# Patient Record
Sex: Female | Born: 1965 | Race: White | Hispanic: No | Marital: Married | Smoking: Never smoker
Health system: Southern US, Community
[De-identification: ages and names within clinical notes are randomized; demographics above are authoritative.]

## PROBLEM LIST (undated history)

## (undated) DIAGNOSIS — Z9889 Other specified postprocedural states: Secondary | ICD-10-CM

## (undated) DIAGNOSIS — R000 Tachycardia, unspecified: Secondary | ICD-10-CM

## (undated) DIAGNOSIS — L409 Psoriasis, unspecified: Secondary | ICD-10-CM

## (undated) DIAGNOSIS — K219 Gastro-esophageal reflux disease without esophagitis: Secondary | ICD-10-CM

## (undated) DIAGNOSIS — R011 Cardiac murmur, unspecified: Secondary | ICD-10-CM

## (undated) DIAGNOSIS — R112 Nausea with vomiting, unspecified: Secondary | ICD-10-CM

## (undated) DIAGNOSIS — Z8711 Personal history of peptic ulcer disease: Secondary | ICD-10-CM

## (undated) DIAGNOSIS — Z87442 Personal history of urinary calculi: Secondary | ICD-10-CM

## (undated) HISTORY — PX: UPPER GI ENDOSCOPY: SHX6162

## (undated) HISTORY — DX: Gastro-esophageal reflux disease without esophagitis: K21.9

## (undated) HISTORY — PX: LITHOTRIPSY: SUR834

## (undated) HISTORY — DX: Tachycardia, unspecified: R00.0

## (undated) HISTORY — PX: COLONOSCOPY: SHX174

## (undated) HISTORY — PX: TYMPANOPLASTY W/ MASTOIDECTOMY: SUR1400

## (undated) HISTORY — PX: TONSILLECTOMY: SUR1361

## (undated) HISTORY — DX: Personal history of peptic ulcer disease: Z87.11

---

## 1998-02-01 HISTORY — PX: INNER EAR SURGERY: SHX679

## 2007-02-02 HISTORY — PX: LITHOTRIPSY: SUR834

## 2007-06-15 ENCOUNTER — Emergency Department (HOSPITAL_COMMUNITY): Admission: EM | Admit: 2007-06-15 | Discharge: 2007-06-15 | Payer: Self-pay | Admitting: Emergency Medicine

## 2007-06-15 ENCOUNTER — Ambulatory Visit (HOSPITAL_COMMUNITY): Admission: RE | Admit: 2007-06-15 | Discharge: 2007-06-15 | Payer: Self-pay | Admitting: Urology

## 2007-07-20 ENCOUNTER — Ambulatory Visit (HOSPITAL_COMMUNITY): Admission: RE | Admit: 2007-07-20 | Discharge: 2007-07-20 | Payer: Self-pay | Admitting: Urology

## 2008-09-16 IMAGING — CR DG ABDOMEN 1V
1 series · 1 of 1 positions shown · non-contrast
Comparison: 06/15/2007

CLINICAL DATA: Right renal stone pre left

ABDOMEN - 1 VIEW

[t abdomen supine]
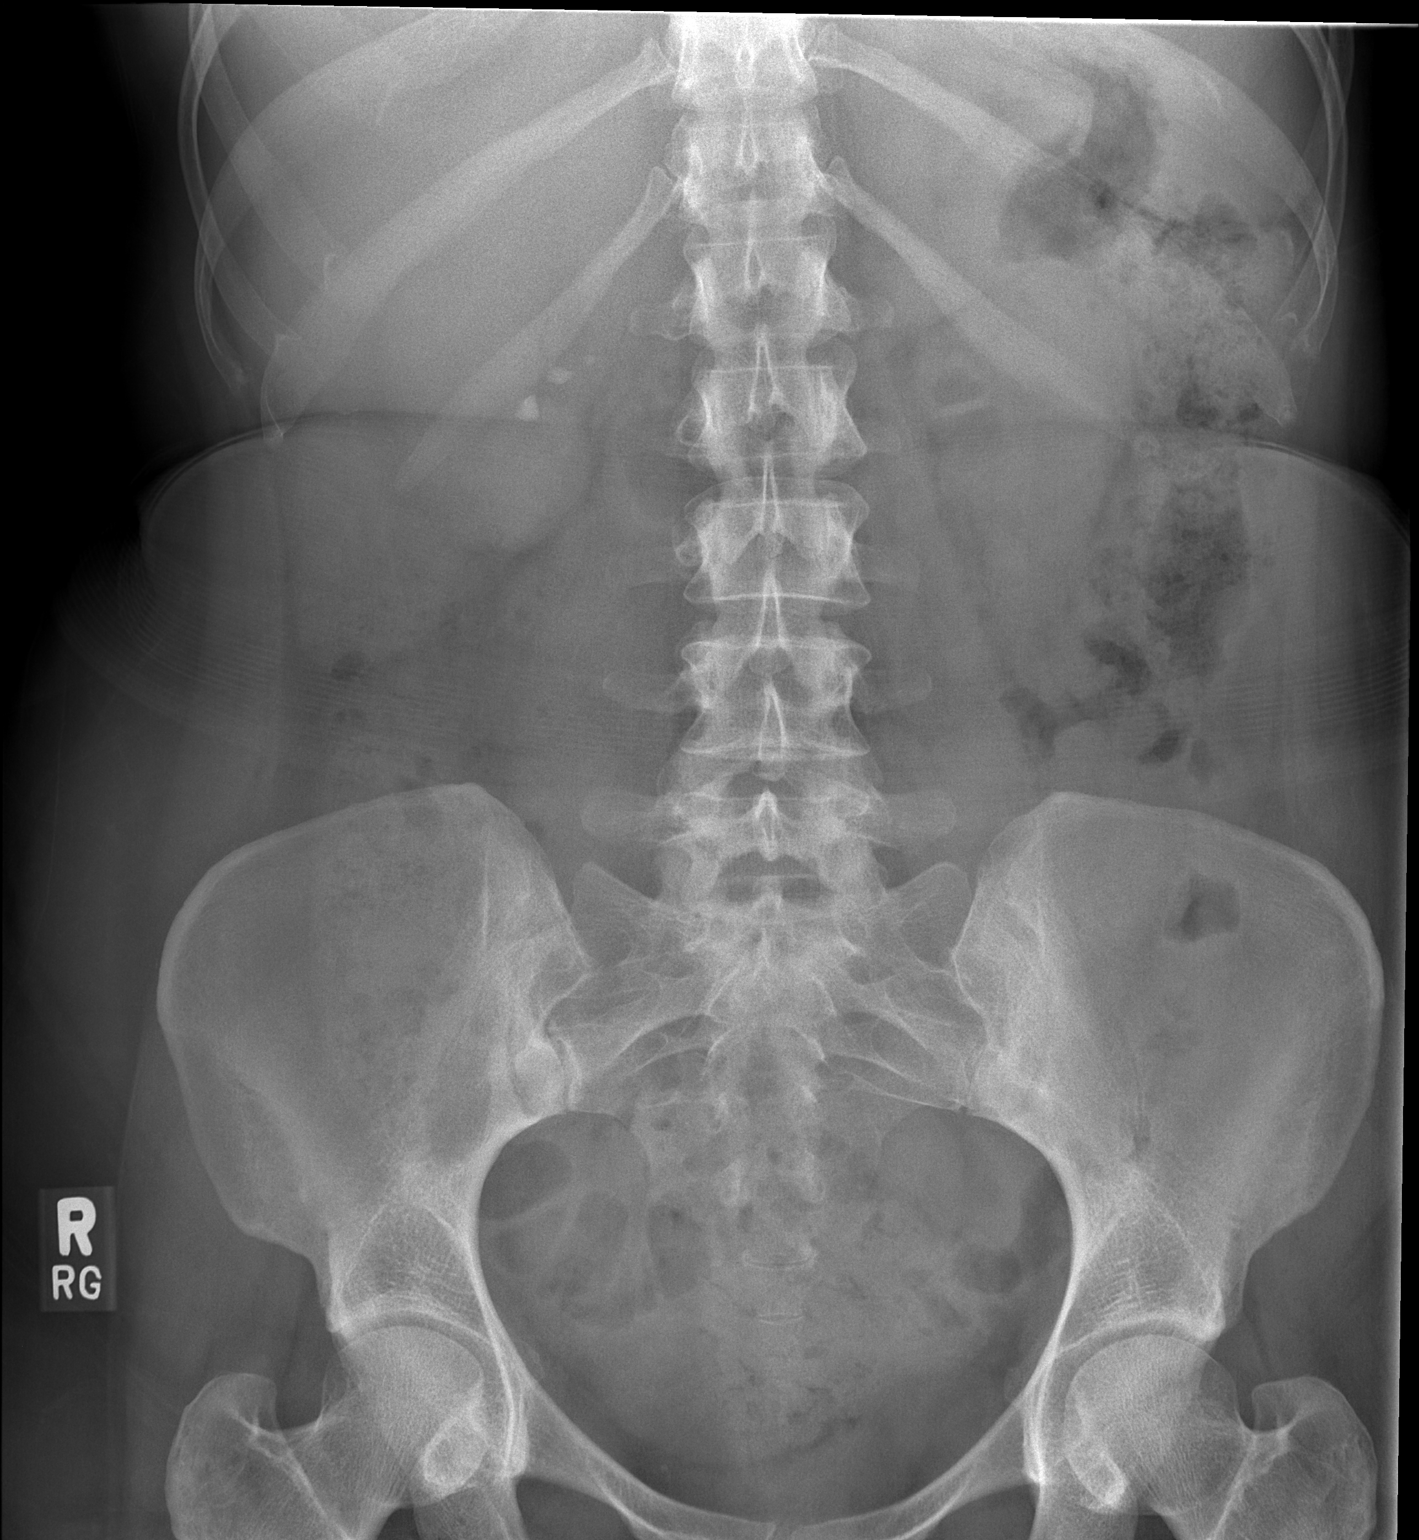

[1 of 1 positions shown; findings below may reference images not displayed]

FINDINGS: There are three calculi overlying the right mid to lower
kidney, the largest stone is approximately 6  mm in diameter.
These   appear to have changed in  position since the prior study
and may be within a diverticulum or within the collecting system.
No renal calculi are seen in the right ureter.  No left renal
calculi are seen.  The bowel gas pattern is normal and  there is no
acute bony abnormality.
IMPRESSION: Multiple right renal calculi

## 2010-06-16 NOTE — Consult Note (Signed)
NAME:  Frances Watkins, VANDENBOSCH NO.:  [id number]   MEDICAL RECORD NO.:  [id number]          PATIENT TYPE:  EMS   LOCATION:  ED                           FACILITY:  [facility]   PHYSICIAN:  Jake Bathe, MD      DATE OF BIRTH:  September 09, 1965   DATE OF CONSULTATION:  06/15/2007  DATE OF DISCHARGE:                                 CONSULTATION   REFERRING PHYSICIAN:  Excell Seltzer. Annabell Howells, M.D.   REASON FOR CONSULTATION:  Ms. Ladd is being seen at the request of Dr.  Annabell Howells for the evaluation of arrhythmia/PVCs during lithotripsy.   HPI:  A 45 year old female with no prior history of coronary artery  disease with hypertension being treated with verapamil, as well as early  family history of coronary artery disease with her father having bypass  at age 3 who has a right ureteral stone and was undergoing lithotripsy  when during the procedure was noted to develop frequent PVCs which then  began to present in a bigeminy pattern.  During this time, she was  hemodynamically stable and tolerating arrhythmia well.  No sustained  arrhythmias such as VT were noted.   ECG was obtained here in [facility] and  demonstrated sinus rhythm, rate 70 with normal intervals, a QTC of 429.  There was a U-wave in V2-V3 noted.   Currently, she is asymptomatic with no complaints of chest pain, fevers,  chills, nausea, vomiting, shortness of breath, syncope, or presyncope.  Rarely has palpitations at home.  No illicit drug use.  No over-the-  counter cold medicines.   Recently, was told by her physician to decrease her verapamil from 180  mg to 120 mg given relative bradycardia.   PAST MEDICAL HISTORY:  1. Murmur - since childhood.  2. Bradycardia.  3. Hypertension - treated with verapamil.  4. Recent weight loss, intentional.  5. Psoriasis.   PAST SURGICAL HISTORY:  Cholesteatoma removal, left ear, in year 2000  with no return of growth.   ALLERGIES:  SULFA CAUSES HIVES.   MEDICATIONS:  1. Verapamil 180 mg SR at night.  2. Hydrochlorothiazide 25 mg q.h.s.  3. Oral contraceptive pill Loestrin 24 F.   FAMILY HISTORY:  Father had bypass surgery at age 22.  No early family  history of sudden cardiac death in her family.   SOCIAL HISTORY:  No tobacco use, rare alcohol use, 2 to 3 beers a month.  No illicit drug use.  No over-the-counter cold medicines.  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  Married, here with her husband.   REVIEW OF SYSTEMS:  No syncope.  No bleeding.  No joint pain.  Unless  explained above, all other 12 review of systems negative.   PHYSICAL EXAM:  VITAL SIGNS:  Blood pressure 126/73.  Pulse originally  112, now 63.  Respiration rate 13 or 14.  Satting 100% on room air.  GENERAL:  Alert, oriented x3, in no acute distress, pleasant, lying in  bed with a slight chill.  EYES:  Well-perfused conjunctivae.  EOMI.  No scleral icterus.  NECK:  Supple.  No lymphadenopathy.  No thyromegaly.  No carotid bruits  noted.  No JVD.  CARDIOVASCULAR:  Regular rate and rhythm with a soft systolic murmur  heard at apex.  Normal PMI.  LUNGS:  Clear to auscultation bilaterally.  Normal respiratory effort.  ABDOMEN:  Soft, nontender.  Normoactive bowel sounds.  No bruits heard.  EXTREMITIES:  No clubbing, cyanosis, or edema.  Normal distal pulses.  SKIN:  Warm, dry, and intact.  No rashes.  NEUROLOGIC:  Nonfocal.  No tremors.   DATA:  EKG as described above.  Telemetry strips noteworthy for sinus  rhythm and PVCs and bigeminy.   LABS:  Sodium 139, potassium 3.7, CO2 of 28, BUN 12, creatinine 0.68,  glucose 91, calcium 8.8, magnesium pending.  KUB pending.  Echocardiogram pending.   ASSESSMENT AND PLAN:  A 45 year old female with ureteral stone,  undergoing lithotripsy, who developed arrhythmia/premature ventricular  contractions/ventricular bigeminy with hypertension and early family   history of coronary artery disease.  1. Arrhythmia - Ventricular bigeminy/premature ventricular      contractions.  Most likely benign.  No evidence of electrolyte      disorder.  We will check a magnesium.  Small U-wave seen on      electrocardiogram which is sometimes seen in hypokalemia which she      does not have or hypomagnesemia which I am checking.  No sustained      arrhythmias.  I do appreciate soft murmur and I have scheduled her      for an echocardiogram on May 27 at 9:00 a.m.   If pain presents with her ureteral stones and she needs to undergo stent  placement, I feel as though she is low risk from a cardiovascular  standpoint for surgery and should be able to tolerate anesthesia well.  It would not be unreasonable to give her a low-dose beta blocker such as  metoprolol 25 mg prior to surgical procedure to blunt any premature  ventricular contractions.  She is supposed to decrease her verapamil  from 180 to 120 given her relative bradycardia which has been seen as an  outpatient.  By no means is metoprolol mandatory and she should be able  to tolerate anesthesia without this medication.  1. Hypertension - Continue hydrochlorothiazide and verapamil at      decreased dose 120 mg once a day.  Well controlled currently.  2. Ureteral stone - Per Dr. Annabell Howells.   Please call if there are any further questions.  I will follow up with  her echocardiogram and make any further recommendations at that time if  needed.      Jake Bathe, MD  Electronically Signed     MCS/MEDQ  D:  06/15/2007  T:  06/15/2007  Job:  [id number]   cc:   Excell Seltzer. Annabell Howells, M.D.  Fax: [phone]   [employer]  Fax: [phone]

## 2010-10-29 LAB — PREGNANCY, URINE: Preg Test, Ur: NEGATIVE

## 2011-10-05 DIAGNOSIS — Z8669 Personal history of other diseases of the nervous system and sense organs: Secondary | ICD-10-CM | POA: Insufficient documentation

## 2011-10-05 DIAGNOSIS — H6980 Other specified disorders of Eustachian tube, unspecified ear: Secondary | ICD-10-CM

## 2011-10-05 DIAGNOSIS — H6990 Unspecified Eustachian tube disorder, unspecified ear: Secondary | ICD-10-CM

## 2011-10-05 HISTORY — DX: Personal history of other diseases of the nervous system and sense organs: Z86.69

## 2011-10-05 HISTORY — DX: Unspecified eustachian tube disorder, unspecified ear: H69.90

## 2011-10-05 HISTORY — DX: Other specified disorders of Eustachian tube, unspecified ear: H69.80

## 2011-10-27 ENCOUNTER — Other Ambulatory Visit: Payer: Self-pay | Admitting: Urology

## 2011-10-27 ENCOUNTER — Encounter (HOSPITAL_COMMUNITY): Payer: Self-pay | Admitting: Pharmacy Technician

## 2011-10-27 ENCOUNTER — Encounter (HOSPITAL_COMMUNITY): Payer: Self-pay | Admitting: *Deleted

## 2011-10-27 NOTE — Pre-Procedure Instructions (Signed)
Asked to bring blue folder the day of the procedure,insurance card,I.D. driver's license,wear comfortable clothing and have a driver for the day. Asked not to take Advil,Motrin,Ibuprofen,Aleve or any NSAIDS, Aspirin, or Toradol for 72 hours prior to procedure,  No vitamins or herbal medications 7 days prior to procedure. Stopping Aspirin and Multivitamin today. Instructed to take laxative per doctor's office instructions and eat a light dinner the evening before procedure.   To arrive at 1415 for lithotripsy procedure.

## 2011-10-28 ENCOUNTER — Other Ambulatory Visit: Payer: Self-pay | Admitting: Urology

## 2011-11-01 ENCOUNTER — Ambulatory Visit (HOSPITAL_COMMUNITY): Payer: PRIVATE HEALTH INSURANCE

## 2011-11-01 ENCOUNTER — Encounter (HOSPITAL_COMMUNITY): Payer: Self-pay | Admitting: *Deleted

## 2011-11-01 ENCOUNTER — Encounter (HOSPITAL_COMMUNITY)
Admission: RE | Disposition: A | Discharge status: Home / Self Care | Payer: Self-pay | Source: Ambulatory Visit | Attending: Urology

## 2011-11-01 ENCOUNTER — Ambulatory Visit (HOSPITAL_COMMUNITY)
Admission: RE | Admit: 2011-11-01 | Discharge: 2011-11-01 | Disposition: A | Discharge status: Home / Self Care | Payer: PRIVATE HEALTH INSURANCE | Source: Ambulatory Visit | Attending: Urology | Admitting: Urology

## 2011-11-01 DIAGNOSIS — N200 Calculus of kidney: Secondary | ICD-10-CM | POA: Insufficient documentation

## 2011-11-01 DIAGNOSIS — I10 Essential (primary) hypertension: Secondary | ICD-10-CM | POA: Insufficient documentation

## 2011-11-01 HISTORY — DX: Cardiac murmur, unspecified: R01.1

## 2011-11-01 LAB — PREGNANCY, URINE: Preg Test, Ur: NEGATIVE

## 2011-11-01 SURGERY — LITHOTRIPSY, ESWL
Anesthesia: LOCAL | Laterality: Right

## 2011-11-01 MED ORDER — CIPROFLOXACIN HCL 500 MG PO TABS
500.0000 mg | ORAL_TABLET | ORAL | Status: AC
  Administered 2011-11-01: 500 mg via ORAL
  Filled 2011-11-01: qty 1

## 2011-11-01 MED ORDER — DIPHENHYDRAMINE HCL 25 MG PO CAPS
25.0000 mg | ORAL_CAPSULE | ORAL | Status: AC
  Administered 2011-11-01: 25 mg via ORAL
  Filled 2011-11-01: qty 1

## 2011-11-01 MED ORDER — DIAZEPAM 5 MG PO TABS
10.0000 mg | ORAL_TABLET | ORAL | Status: AC
  Administered 2011-11-01: 10 mg via ORAL
  Filled 2011-11-01: qty 2

## 2011-11-01 MED ORDER — DEXTROSE-NACL 5-0.45 % IV SOLN
INTRAVENOUS | Status: DC
  Administered 2011-11-01: 15:00:00 via INTRAVENOUS

## 2011-11-01 NOTE — H&P (Signed)
eason For Visit  Acute evaluation of right flank pain and gross hematuria   History of Present Illness    46 y/o white female with h/o nephrolithiasis present for acute evaluation of gross hematuria and right flank pain.  She reports a 1 week h/o mild right low back pain intermittently.  She developed sudden onset of painless, gross hematuria this morning which is a similar presentation as when she had her last kidney stone attack in 2009.  Her pain has remained tolerable and intermittent.  She denies n/v, dysuria, frequency, urgency, fever or chills.  She has not passed any stones since we saw her last.  Her last KUB 2011 showed a single remaining RLP stone.   Past Medical History Problems  1. History of  Gross Hematuria 599.71 2. History of  Hypertension 401.9 3. History of  Murmurs 785.2  Surgical History Problems  1. History of  Lithotripsy 2. History of  Lithotripsy 3. History of  Mastoidectomy, Left Ear 4. History of  Tonsillectomy  Current Meds 1. Mirena IUD; Therapy: (Recorded:24Sep2013) to  Allergies Medication  1. Sulfa Drugs  Family History Problems  1. Paternal history of  Diabetes Mellitus V18.0 2. Paternal uncle's history of  Diabetes Mellitus V18.0 3. Family history of  Family Health Status - Father's Age 21 4. Family history of  Family Health Status - Mother's Age 22 5. Family history of  Family Health Status Number Of Children 2 daughters 6. Paternal history of  Hyperlipidemia 7. Paternal history of  Hypertension V17.49  Social History Problems  1. Alcohol Use 2-4 a month 2. Caffeine Use 1-2 a day 3. Marital History - Currently Married 4. 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 History of  Tobacco Use  Review of Systems Genitourinary, constitutional, skin, eye, otolaryngeal, hematologic/lymphatic, cardiovascular, pulmonary, endocrine, musculoskeletal, gastrointestinal, neurological and psychiatric system(s) were reviewed and  pertinent findings if present are noted.  Genitourinary: hematuria.  Gastrointestinal: flank pain.  Musculoskeletal: back pain.    Vitals Vital Signs [Data Includes: Last 1 Day]  24Sep2013 01:45PM  BMI Calculated: 24.1 BSA Calculated: 1.64 Height: 5 ft 3 in Weight: 136 lb  Blood Pressure: 188 / 100 Temperature: 98.2 F Heart Rate: 62  Physical Exam Constitutional: Well nourished and well developed . No acute distress.  ENT:. The ears and nose are normal in appearance.  Neck: The appearance of the neck is normal and no neck mass is present.  Pulmonary: No respiratory distress and normal respiratory rhythm and effort.  Cardiovascular:. No peripheral edema.  Abdomen: No CVA tenderness.  Skin: Normal skin turgor, no visible rash and no visible skin lesions.  Neuro/Psych:. Mood and affect are appropriate.    Results/Data  The following images/tracing/specimen were independently visualized: Marland Kitchen  KUB demonstrates an 8mm right proximal ureteral stone at the UPJ.  No other urolithiasis noted.  The following clinical lab reports were reviewed: Marland Kitchen  Urinalysis Selected Results  UA With REFLEX 24Sep2013 01:37PM Marcello Fennel   Test Name Result Flag Reference  COLOR RED A YELLOW  Biochemicals may be affected by the color of the urine.  APPEARANCE CLEAR  CLEAR  SPECIFIC GRAVITY <1.005 L 1.005-1.030  pH 6.5  5.0-8.0  GLUCOSE NEG mg/dL  NEG  BILIRUBIN NEG  NEG  KETONE NEG mg/dL  NEG  BLOOD LARGE A NEG  PROTEIN 30 mg/dL A NEG  UROBILINOGEN 0.2 mg/dL  2.9-5.6  NITRITE NEG  NEG  LEUKOCYTE ESTERASE NEG  NEG  SQUAMOUS EPITHELIAL/HPF NONE SEEN  RARE  WBC 3-6 WBC/hpf A <3  RBC TNTC RBC/hpf A <3  BACTERIA MODERATE A RARE  CRYSTALS NONE SEEN  NONE SEEN  CASTS NONE SEEN  NONE SEEN   Assessment Assessed  1. Nephrolithiasis Of The Right Kidney 592.0 2. Diffuse Abdominal Pain Right 789.07 3. Pyuria 791.9 4. Proximal Ureteral Stone On The Right 592.1   8mm right UPJ stone causing minimal  sxs at this point.  Urinalysis appears grossly infected despite pts minimal symptoms.  Urine C&S sent and will treat in light of obstructing proximal ureteral stone.  Pt is afebrile.  We discussed treatment options being ESWL vs cystoureteroscopy with HLL and possible DJ stent placement given the large stone size.  Risks and benefits of each procedure discussed in detail.  She elects to proceed with scheduling right ESWL.   Plan  Health Maintenance (V70.0)  1. UA With REFLEX  Done: 24Sep2013 01:37PM    Urine C&S sent.  Will start Cipro 500mg  po BID x7 days empirically and adjust prn based on final report.  Start Rapaflo 8mg  po qd for MET.  Strain urine. Percocet prn pain. Zofran prn N/V. Orders completed for Right ESWL to be done first available.  She knows to call promptly for uncontrolled pain, N/V, fever or chills. KUB  Status: Resulted - Requires Verification  Done: 01Jan0001 12:00AM Ordered Today; For: Diffuse Abdominal Pain (789.07); Ordered By: Marcello Fennel  Due: 26Sep2013 Marked Important; Last Updated By: Dorcas Mcmurray

## 2011-11-01 NOTE — Interval H&P Note (Signed)
History and Physical Interval Note:  11/01/2011 4:22 PM  Frances Watkins  has presented today for surgery, with the diagnosis of Right Proximal Ureteral Stone at Ureteral Pelvic Junction  The various methods of treatment have been discussed with the patient and family. After consideration of risks, benefits and other options for treatment, the patient has consented to  Procedure(s) (LRB) with comments: EXTRACORPOREAL SHOCK WAVE LITHOTRIPSY (ESWL) (Right) as a surgical intervention .  The patient's history has been reviewed, patient examined, no change in status, stable for surgery.  I have reviewed the patient's chart and labs.  Questions were answered to the patient's satisfaction.     Nuri Larmer J

## 2012-02-02 HISTORY — PX: URETEROSCOPY: SHX842

## 2012-03-09 ENCOUNTER — Encounter (HOSPITAL_COMMUNITY): Payer: Self-pay | Admitting: *Deleted

## 2012-03-09 ENCOUNTER — Other Ambulatory Visit: Payer: Self-pay | Admitting: Urology

## 2012-03-10 ENCOUNTER — Encounter (HOSPITAL_COMMUNITY): Payer: Self-pay | Admitting: Pharmacy Technician

## 2012-03-10 ENCOUNTER — Encounter (HOSPITAL_COMMUNITY): Payer: Self-pay | Admitting: *Deleted

## 2012-03-13 NOTE — H&P (Signed)
ctive Problems Problems  1. Nephrolithiasis Of The Right Kidney 592.0  History of Present Illness     Frances Watkins returns today in f/u.  She had ESWL for a right renal stone on 11/01/11.  She passed a lot of fragments and has no residual symptoms.  Her stone was calcium oxalate.  She has no associated signs or symptoms, but her KUB today shows a 7mm renal pelvic stone.   Past Medical History Problems  1. History of  Diffuse Abdominal Pain Right 789.07 2. History of  Gross Hematuria 599.71 3. History of  Hypertension 401.9 4. History of  Murmurs 785.2 5. History of  Proximal Ureteral Stone On The Right 592.1 6. History of  Psoriasis 696.1 7. History of  Pyuria 791.9  Surgical History Problems  1. History of  Lithotripsy 2. History of  Lithotripsy 3. History of  Lithotripsy 4. History of  Mastoidectomy, Left Ear 5. History of  Tonsillectomy  Current Meds 1. Humira Pen 40 MG/0.8ML Subcutaneous Kit; Therapy: 03Jan2014 to 2. Mirena IUD; Therapy: (Recorded:24Sep2013) to  Allergies Medication  1. Sulfa Drugs  Family History Problems  1. Paternal history of  Diabetes Mellitus V18.0 2. Paternal uncle's history of  Diabetes Mellitus V18.0 3. Family history of  Family Health Status - Father's Age 60 4. Family history of  Family Health Status - Mother's Age 60 5. Family history of  Family Health Status Number Of Children 2 daughters 6. Paternal history of  Hyperlipidemia 7. Paternal history of  Hypertension V17.49  Social History Problems  1. Alcohol Use 2-4 a month 2. Caffeine Use 1-2 a day 3. Marital History - Currently Married 4. 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  Gastrointestinal: no flank pain.    Vitals Vital Signs [Data Includes: Last 1 Day]  05Feb2014 12:14PM  Blood Pressure: 184 / 96 Heart Rate: 57  Physical Exam Constitutional: Well nourished and well developed . No acute distress.   Pulmonary: No respiratory distress and normal respiratory rhythm and effort.  Cardiovascular: Heart rate and rhythm are normal . No peripheral edema.    Results/Data Urine [Data Includes: Last 1 Day]   05Feb2014  COLOR STRAW   APPEARANCE CLEAR   SPECIFIC GRAVITY <1.005   pH 5.5   GLUCOSE NEG mg/dL  BILIRUBIN NEG   KETONE NEG mg/dL  BLOOD NEG   PROTEIN NEG mg/dL  UROBILINOGEN 0.2 mg/dL  NITRITE NEG   LEUKOCYTE ESTERASE NEG    The following images/tracing/specimen were independently visualized:  KUB shows a 7-8mm stone that was in the RUP on her prior film, but it now looks like it is in the renal pelvis. There is an IUD but no other abnormalities. A right renal US confirmed the renal pelvic stone that is 6mm. There is mild right hydro. See the study sheet for measurements and details.    Assessment Assessed  1. Nephrolithiasis Of The Right Kidney 592.0   She has a persistent 7mm fragment that is now in the right renal pelvis.  She has minimal hydro and no symptoms.   Plan Health Maintenance (V70.0)  1. UA With REFLEX  Done: 05Feb2014 11:25AM Nephrolithiasis Of The Right Kidney (592.0)  2. HYPERCALCIURA PROFILE  Requested for: 05Feb2014 3. KUB  Requested for: 05Feb2014 4. RENAL U/S RIGHT  Done: 05Feb2014 12:00AM 5. STONE RISK  Requested for: 05Feb2014 6. Follow-up Month x 3 Office  Follow-up  Requested for: 05Feb2014   I discussed the   options for treatment. The stone has survived 3 ESWL's over the years so I recommended ureteroscopy vs PCNL, but for this sized stone I think ureteroscopy would be best.  I reviewed the risks of bleeding, infection, ureteral and renal injury, need for a stent or second procedures, thrombotic events and anesthetic complications. I will get a hypercalcuria profile and stone risk analysis. She is going to think about the options and will get back to me.  Tentatively I will put her down for a 3 month f/u and KUB since she may want to watch it for now  since she has no symptoms.   

## 2012-03-14 ENCOUNTER — Encounter (HOSPITAL_COMMUNITY): Payer: Self-pay | Admitting: Anesthesiology

## 2012-03-14 ENCOUNTER — Ambulatory Visit (HOSPITAL_COMMUNITY): Payer: PRIVATE HEALTH INSURANCE | Admitting: Anesthesiology

## 2012-03-14 ENCOUNTER — Encounter (HOSPITAL_COMMUNITY)
Admission: RE | Disposition: A | Discharge status: Home / Self Care | Payer: Self-pay | Source: Ambulatory Visit | Attending: Urology

## 2012-03-14 ENCOUNTER — Ambulatory Visit (HOSPITAL_COMMUNITY)
Admission: RE | Admit: 2012-03-14 | Discharge: 2012-03-14 | Disposition: A | Discharge status: Home / Self Care | Payer: PRIVATE HEALTH INSURANCE | Source: Ambulatory Visit | Attending: Urology | Admitting: Urology

## 2012-03-14 ENCOUNTER — Encounter (HOSPITAL_COMMUNITY): Payer: Self-pay | Admitting: *Deleted

## 2012-03-14 DIAGNOSIS — N200 Calculus of kidney: Secondary | ICD-10-CM | POA: Insufficient documentation

## 2012-03-14 DIAGNOSIS — N135 Crossing vessel and stricture of ureter without hydronephrosis: Secondary | ICD-10-CM | POA: Insufficient documentation

## 2012-03-14 DIAGNOSIS — I10 Essential (primary) hypertension: Secondary | ICD-10-CM | POA: Insufficient documentation

## 2012-03-14 DIAGNOSIS — R011 Cardiac murmur, unspecified: Secondary | ICD-10-CM | POA: Insufficient documentation

## 2012-03-14 DIAGNOSIS — N201 Calculus of ureter: Secondary | ICD-10-CM | POA: Insufficient documentation

## 2012-03-14 DIAGNOSIS — Z79899 Other long term (current) drug therapy: Secondary | ICD-10-CM | POA: Insufficient documentation

## 2012-03-14 DIAGNOSIS — L408 Other psoriasis: Secondary | ICD-10-CM | POA: Insufficient documentation

## 2012-03-14 HISTORY — DX: Psoriasis, unspecified: L40.9

## 2012-03-14 HISTORY — DX: Personal history of urinary calculi: Z87.442

## 2012-03-14 LAB — CBC
HCT: 39.7 % (ref 36.0–46.0)
Hemoglobin: 13.9 g/dL (ref 12.0–15.0)
MCHC: 35 g/dL (ref 30.0–36.0)

## 2012-03-14 LAB — SURGICAL PCR SCREEN: Staphylococcus aureus: POSITIVE — AB

## 2012-03-14 SURGERY — CYSTOURETEROSCOPY, WITH STENT INSERTION
Anesthesia: General | Site: Ureter | Laterality: Right | Wound class: Clean Contaminated

## 2012-03-14 MED ORDER — BELLADONNA ALKALOIDS-OPIUM 16.2-60 MG RE SUPP
RECTAL | Status: DC | PRN
  Administered 2012-03-14: 1 via RECTAL

## 2012-03-14 MED ORDER — PROMETHAZINE HCL 25 MG/ML IJ SOLN: 6.2500 mg | INTRAMUSCULAR | Status: DC | PRN

## 2012-03-14 MED ORDER — LACTATED RINGERS IV SOLN: INTRAVENOUS | Status: DC

## 2012-03-14 MED ORDER — MUPIROCIN 2 % EX OINT
TOPICAL_OINTMENT | CUTANEOUS | Status: AC
  Filled 2012-03-14: qty 22

## 2012-03-14 MED ORDER — PHENAZOPYRIDINE HCL 200 MG PO TABS: 200.0000 mg | ORAL_TABLET | Freq: Three times a day (TID) | ORAL | Status: DC | PRN

## 2012-03-14 MED ORDER — BELLADONNA ALKALOIDS-OPIUM 16.2-60 MG RE SUPP
RECTAL | Status: AC
  Filled 2012-03-14: qty 1

## 2012-03-14 MED ORDER — HYDROMORPHONE HCL PF 1 MG/ML IJ SOLN: 0.2500 mg | INTRAMUSCULAR | Status: DC | PRN

## 2012-03-14 MED ORDER — SODIUM CHLORIDE 0.9 % IV SOLN: 250.0000 mL | INTRAVENOUS | Status: DC | PRN

## 2012-03-14 MED ORDER — ONDANSETRON HCL 4 MG/2ML IJ SOLN: 4.0000 mg | Freq: Four times a day (QID) | INTRAMUSCULAR | Status: DC | PRN

## 2012-03-14 MED ORDER — FENTANYL CITRATE 0.05 MG/ML IJ SOLN: 25.0000 ug | INTRAMUSCULAR | Status: DC | PRN

## 2012-03-14 MED ORDER — CIPROFLOXACIN IN D5W 400 MG/200ML IV SOLN
INTRAVENOUS | Status: AC
  Filled 2012-03-14: qty 200

## 2012-03-14 MED ORDER — OXYCODONE-ACETAMINOPHEN 5-325 MG PO TABS: 1.0000 | ORAL_TABLET | ORAL | Status: DC | PRN

## 2012-03-14 MED ORDER — LACTATED RINGERS IV SOLN
INTRAVENOUS | Status: DC
  Administered 2012-03-14: 09:00:00 via INTRAVENOUS

## 2012-03-14 MED ORDER — ONDANSETRON HCL 4 MG/2ML IJ SOLN
INTRAMUSCULAR | Status: DC | PRN
  Administered 2012-03-14: 4 mg via INTRAVENOUS

## 2012-03-14 MED ORDER — MIDAZOLAM HCL 5 MG/5ML IJ SOLN
INTRAMUSCULAR | Status: DC | PRN
  Administered 2012-03-14: 2 mg via INTRAVENOUS

## 2012-03-14 MED ORDER — SODIUM CHLORIDE 0.9 % IR SOLN
Status: DC | PRN
  Administered 2012-03-14: 3000 mL via INTRAVESICAL

## 2012-03-14 MED ORDER — SODIUM CHLORIDE 0.9 % IJ SOLN: 3.0000 mL | INTRAMUSCULAR | Status: DC | PRN

## 2012-03-14 MED ORDER — LACTATED RINGERS IV SOLN
INTRAVENOUS | Status: DC | PRN
  Administered 2012-03-14: 09:00:00 via INTRAVENOUS

## 2012-03-14 MED ORDER — ACETAMINOPHEN 325 MG PO TABS: 650.0000 mg | ORAL_TABLET | ORAL | Status: DC | PRN

## 2012-03-14 MED ORDER — FENTANYL CITRATE 0.05 MG/ML IJ SOLN
INTRAMUSCULAR | Status: DC | PRN
  Administered 2012-03-14 (×2): 25 ug via INTRAVENOUS

## 2012-03-14 MED ORDER — CIPROFLOXACIN IN D5W 400 MG/200ML IV SOLN
400.0000 mg | INTRAVENOUS | Status: AC
  Administered 2012-03-14: 400 mg via INTRAVENOUS

## 2012-03-14 MED ORDER — PROPOFOL 10 MG/ML IV BOLUS
INTRAVENOUS | Status: DC | PRN
  Administered 2012-03-14: 150 mg via INTRAVENOUS

## 2012-03-14 MED ORDER — OXYCODONE HCL 5 MG PO TABS
5.0000 mg | ORAL_TABLET | ORAL | Status: DC | PRN
  Administered 2012-03-14: 5 mg via ORAL
  Filled 2012-03-14: qty 1

## 2012-03-14 MED ORDER — IOHEXOL 300 MG/ML  SOLN
INTRAMUSCULAR | Status: DC | PRN
  Administered 2012-03-14: 25 mL

## 2012-03-14 MED ORDER — ACETAMINOPHEN 650 MG RE SUPP
650.0000 mg | RECTAL | Status: DC | PRN
  Filled 2012-03-14: qty 1

## 2012-03-14 MED ORDER — SODIUM CHLORIDE 0.9 % IJ SOLN: 3.0000 mL | Freq: Two times a day (BID) | INTRAMUSCULAR | Status: DC

## 2012-03-14 MED ORDER — HYOSCYAMINE SULFATE 0.125 MG SL SUBL: 0.1250 mg | SUBLINGUAL_TABLET | SUBLINGUAL | Status: DC | PRN

## 2012-03-14 MED ORDER — MUPIROCIN 2 % EX OINT
TOPICAL_OINTMENT | Freq: Two times a day (BID) | CUTANEOUS | Status: DC
  Administered 2012-03-14: 1 via NASAL

## 2012-03-14 MED ORDER — IOHEXOL 300 MG/ML  SOLN
INTRAMUSCULAR | Status: AC
  Filled 2012-03-14: qty 1

## 2012-03-14 SURGICAL SUPPLY — 15 items
BAG URO CATCHER STRL LF (DRAPE) ×3 IMPLANT
BASKET LASER NITINOL 1.9FR (BASKET) ×3 IMPLANT
CATH URET 5FR 28IN OPEN ENDED (CATHETERS) ×3 IMPLANT
CLOTH BEACON ORANGE TIMEOUT ST (SAFETY) ×3 IMPLANT
DRAPE CAMERA CLOSED 9X96 (DRAPES) ×3 IMPLANT
GLOVE SURG SS PI 8.0 STRL IVOR (GLOVE) ×3 IMPLANT
GOWN PREVENTION PLUS XLARGE (GOWN DISPOSABLE) ×3 IMPLANT
GOWN STRL REIN XL XLG (GOWN DISPOSABLE) ×3 IMPLANT
GUIDEWIRE STR DUAL SENSOR (WIRE) ×6 IMPLANT
MANIFOLD NEPTUNE II (INSTRUMENTS) ×3 IMPLANT
MARKER SKIN DUAL TIP RULER LAB (MISCELLANEOUS) ×3 IMPLANT
PACK CYSTO (CUSTOM PROCEDURE TRAY) ×3 IMPLANT
SHEATH ACCESS URETERAL 38CM (SHEATH) ×3 IMPLANT
STENT CONTOUR 6FRX24X.038 (STENTS) ×3 IMPLANT
TUBING CONNECTING 10 (TUBING) ×3 IMPLANT

## 2012-03-14 NOTE — Progress Notes (Signed)
Patient and husband were instructed to continue  to use Mupirocin ointment for a total of 10 doses

## 2012-03-14 NOTE — Brief Op Note (Signed)
03/14/2012  9:34 AM  PATIENT:  Frances Watkins  47 y.o. female  PRE-OPERATIVE DIAGNOSIS:  right renal stone   POST-OPERATIVE DIAGNOSIS:  right renal stone   PROCEDURE:  Procedure(s): CYSTOSCOPY WITH Ureteroscopy and STENT PLACEMENT (Right)  SURGEON:  Surgeon(s) and Role:    * Anner Crete, MD - Primary  PHYSICIAN ASSISTANT:   ASSISTANTS: none   ANESTHESIA:   general  EBL:     BLOOD ADMINISTERED:none  DRAINS: 6x24 right JJ stent   LOCAL MEDICATIONS USED:  NONE  SPECIMEN:  No Specimen  DISPOSITION OF SPECIMEN:  N/A  COUNTS:  YES  TOURNIQUET:  * No tourniquets in log *  DICTATION: .Other Dictation: Dictation Number [id number]  PLAN OF CARE: Discharge to home after PACU  PATIENT DISPOSITION:  PACU - hemodynamically stable.   Delay start of Pharmacological VTE agent (>24hrs) due to surgical blood loss or risk of bleeding: not applicable

## 2012-03-14 NOTE — Interval H&P Note (Signed)
History and Physical Interval Note:  03/14/2012 8:08 AM  Frances Watkins  has presented today for surgery, with the diagnosis of right renal stone   The various methods of treatment have been discussed with the patient and family. After consideration of risks, benefits and other options for treatment, the patient has consented to  Procedure(s) with comments: Right Ureteroscopy Stone Extraction  (Right) - right ureteroscopy stone extraction  HOLMIUM LASER APPLICATION (Right) as a surgical intervention .  The patient's history has been reviewed, patient examined, no change in status, stable for surgery.  I have reviewed the patient's chart and labs.  Questions were answered to the patient's satisfaction.     Kevaughn Ewing J

## 2012-03-14 NOTE — Anesthesia Preprocedure Evaluation (Signed)
Anesthesia Evaluation  Patient identified by MRN, date of birth, ID band Patient awake    Reviewed: Allergy & Precautions, H&P , NPO status , Patient's Chart, lab work & pertinent test results  Airway Mallampati: II TM Distance: >3 FB Neck ROM: Full    Dental  (+) Teeth Intact and Dental Advisory Given   Pulmonary neg pulmonary ROS,  breath sounds clear to auscultation  Pulmonary exam normal       Cardiovascular negative cardio ROS  Rhythm:Regular Rate:Normal     Neuro/Psych negative neurological ROS  negative psych ROS   GI/Hepatic negative GI ROS, Neg liver ROS,   Endo/Other  negative endocrine ROS  Renal/GU negative Renal ROS  negative genitourinary   Musculoskeletal negative musculoskeletal ROS (+)   Abdominal   Peds  Hematology negative hematology ROS (+)   Anesthesia Other Findings   Reproductive/Obstetrics negative OB ROS                           Anesthesia Physical Anesthesia Plan  ASA: I  Anesthesia Plan: General   Post-op Pain Management:    Induction: Intravenous  Airway Management Planned: LMA  Additional Equipment:   Intra-op Plan:   Post-operative Plan: Extubation in OR  Informed Consent: I have reviewed the patients History and Physical, chart, labs and discussed the procedure including the risks, benefits and alternatives for the proposed anesthesia with the patient or authorized representative who has indicated his/her understanding and acceptance.   Dental advisory given  Plan Discussed with: CRNA  Anesthesia Plan Comments:         Anesthesia Quick Evaluation  

## 2012-03-14 NOTE — Transfer of Care (Signed)
Immediate Anesthesia Transfer of Care Note  Patient: Frances Watkins  Procedure(s) Performed: Procedure(s) (LRB): CYSTOSCOPY WITH URETEROSCOPY AND STENT PLACEMENT (Right)  Patient Location: PACU  Anesthesia Type: General  Level of Consciousness: sedated, patient cooperative and responds to stimulaton  Airway & Oxygen Therapy: Patient Spontanous Breathing and Patient connected to face mask oxgen  Post-op Assessment: Report given to PACU RN and Post -op Vital signs reviewed and stable  Post vital signs: Reviewed and stable  Complications: No apparent anesthesia complications

## 2012-03-14 NOTE — Op Note (Signed)
NAMEMarland Kitchen  AUDELIA, KNAPE NO.:  [id number]  MEDICAL RECORD NO.:  [id number]  LOCATION:  [facility]                         FACILITY:  [facility]  PHYSICIAN:  Excell Seltzer. Annabell Howells, M.D.    DATE OF BIRTH:  Apr 01, 1965  DATE OF PROCEDURE:  03/14/2012 DATE OF DISCHARGE:  03/14/2012                              OPERATIVE REPORT   PROCEDURE:  Cystoscopy, right retrograde pyelogram with interpretation, and right ureteroscopy and right ureteral stent placement.  PREOPERATIVE DIAGNOSIS:  Right renal stone.  POSTOPERATIVE DIAGNOSIS:  Right renal stone with mild right ureteropelvic junction obstruction.  SURGEON:  Excell Seltzer. Annabell Howells, M.D.  ANESTHESIA:  General.  SPECIMEN:  None.  DRAINS:  A 6-French 24-cm double-J stent on the right.  COMPLICATIONS:  None.  INDICATIONS:  Frances Watkins is a 47 year old white female with history of right renal stone.  This has been treated with lithotripsy twice.  She has passed fragments at both times, but has had residual stones and was sent for followup x-ray, which revealed 7-mm renal stone.  She subsequently began to have pain and has elected to undergo ureteroscopic stone extraction.  FINDINGS OF PROCEDURE:  She was given Cipro.  She was taken to the operating room where general anesthetic was induced.  She was placed in lithotomy position.  Her perineum and genitalia were prepped with Betadine solution.  She was draped in usual sterile fashion.  Cystoscopy was performed using a 22-French scope and 12-degree lens. The urethra was tight and required the obturator and the scope for passage.  Examination of bladder revealed no abnormalities.  The mucosa was smooth and pale without tumors.  The ureteral orifices were unremarkable.  The left ureteral orifice was unremarkable.  The right ureteral orifice was slightly patulous.  After thorough inspection, a 5-French open-end catheter was placed in the right ureteral orifice and contrast was instilled.  This  revealed a normal ureter, but at the UPJ, there was some narrowing.  The renal pelvis was unremarkable.  The stone could be seen in the upper pole collecting system before injection of contrast, but the filling defect was not apparent after.  Once the retrograde pyelogram was performed, a guidewire was passed to the kidney without difficulty.  The cystoscope was then removed and a 38-cm digital access sheath inner core was passed over the wire and easily passed to the kidney, although, I was unable to get it to advance all the way up into the upper pole.  I then assembled the access sheath and passed it over the wire.  The wire and inner core were removed and the digital flexible scope was then passed through the sheath into the proximal ureter.  I was unable to negotiate it through the narrowed UPJ into the renal pelvis.  At this point, the guidewire was reinserted and the access sheath was re- passed, but it tended the UPJ and did not really pass in to the pelvis.  The access sheath inner core were then removed and the ureteroscope was then re-passes.  There was some mucosal tearing at the UPJ, but the lumen remained tight, so I could not get the scope in the renal pelvis.  At  this point, I passed the guidewire again and felt the better part at this point would be to place a stent to allow soft dilation of the UPJ. I did not feel that active dilation would be appropriate at this point. Once the wire was in place, the access sheath and scope were removed and the cystoscope was reinserted over the wire.  A 6-French 24-cm double-J stent was then inserted over the wire under fluoroscopic guidance to the kidney.  The wire was removed leaving good coil in the kidney, a good coil in the bladder.  The bladder was drained.  B and O suppository was placed.  The patient was taken down from lithotomy position.  Her anesthetic was reversed.  She was moved to recovery room in stable condition.   There were no complications.     Excell Seltzer. Annabell Howells, M.D.     JJW/MEDQ  D:  03/14/2012  T:  03/14/2012  Job:  [id number]

## 2012-03-14 NOTE — Anesthesia Postprocedure Evaluation (Signed)
Anesthesia Post Note  Patient: Frances Watkins  Procedure(s) Performed: Procedure(s) (LRB): CYSTOSCOPY WITH URETEROSCOPY AND STENT PLACEMENT (Right)  Anesthesia type: General  Patient location: PACU  Post pain: Pain level controlled  Post assessment: Post-op Vital signs reviewed  Last Vitals:  Filed Vitals:   03/14/12 1141  BP: 127/77  Pulse: 54  Temp: 36.2 C  Resp: 14    Post vital signs: Reviewed  Level of consciousness: sedated  Complications: No apparent anesthesia complications

## 2012-03-15 ENCOUNTER — Other Ambulatory Visit: Payer: Self-pay | Admitting: Urology

## 2012-03-17 ENCOUNTER — Encounter (HOSPITAL_BASED_OUTPATIENT_CLINIC_OR_DEPARTMENT_OTHER): Payer: Self-pay | Admitting: *Deleted

## 2012-03-17 NOTE — Progress Notes (Signed)
To [facility] at 0815-labs,negative urine pregnancy in epic-Npo after Mn-may take pain medication if needed with small amt water that am.

## 2012-03-23 ENCOUNTER — Ambulatory Visit (HOSPITAL_BASED_OUTPATIENT_CLINIC_OR_DEPARTMENT_OTHER)
Admission: RE | Admit: 2012-03-23 | Discharge: 2012-03-23 | Disposition: A | Discharge status: Home / Self Care | Payer: PRIVATE HEALTH INSURANCE | Source: Ambulatory Visit | Attending: Urology | Admitting: Urology

## 2012-03-23 ENCOUNTER — Encounter (HOSPITAL_BASED_OUTPATIENT_CLINIC_OR_DEPARTMENT_OTHER): Payer: Self-pay | Admitting: Anesthesiology

## 2012-03-23 ENCOUNTER — Ambulatory Visit (HOSPITAL_BASED_OUTPATIENT_CLINIC_OR_DEPARTMENT_OTHER): Payer: PRIVATE HEALTH INSURANCE | Admitting: Anesthesiology

## 2012-03-23 ENCOUNTER — Encounter (HOSPITAL_BASED_OUTPATIENT_CLINIC_OR_DEPARTMENT_OTHER)
Admission: RE | Disposition: A | Discharge status: Home / Self Care | Payer: Self-pay | Source: Ambulatory Visit | Attending: Urology

## 2012-03-23 ENCOUNTER — Encounter (HOSPITAL_BASED_OUTPATIENT_CLINIC_OR_DEPARTMENT_OTHER): Payer: Self-pay | Admitting: *Deleted

## 2012-03-23 DIAGNOSIS — N200 Calculus of kidney: Secondary | ICD-10-CM | POA: Insufficient documentation

## 2012-03-23 DIAGNOSIS — I10 Essential (primary) hypertension: Secondary | ICD-10-CM | POA: Insufficient documentation

## 2012-03-23 HISTORY — DX: Other specified postprocedural states: R11.2

## 2012-03-23 HISTORY — PX: HOLMIUM LASER APPLICATION: SHX5852

## 2012-03-23 HISTORY — DX: Other specified postprocedural states: Z98.890

## 2012-03-23 SURGERY — CYSTOSCOPY, WITH CALCULUS MANIPULATION OR REMOVAL
Anesthesia: General | Site: Ureter | Laterality: Right | Wound class: Clean Contaminated

## 2012-03-23 MED ORDER — FENTANYL CITRATE 0.05 MG/ML IJ SOLN
INTRAMUSCULAR | Status: DC | PRN
  Administered 2012-03-23: 25 ug via INTRAVENOUS
  Administered 2012-03-23: 50 ug via INTRAVENOUS
  Administered 2012-03-23 (×5): 25 ug via INTRAVENOUS

## 2012-03-23 MED ORDER — LIDOCAINE HCL (CARDIAC) 20 MG/ML IV SOLN
INTRAVENOUS | Status: DC | PRN
  Administered 2012-03-23: 60 mg via INTRAVENOUS

## 2012-03-23 MED ORDER — OXYCODONE-ACETAMINOPHEN 5-325 MG PO TABS: 1.0000 | ORAL_TABLET | ORAL | Status: DC | PRN

## 2012-03-23 MED ORDER — ACETAMINOPHEN 650 MG RE SUPP
650.0000 mg | RECTAL | Status: DC | PRN
  Filled 2012-03-23: qty 1

## 2012-03-23 MED ORDER — SODIUM CHLORIDE 0.9 % IR SOLN
Status: DC | PRN
  Administered 2012-03-23: 6000 mL via INTRAVESICAL

## 2012-03-23 MED ORDER — DEXAMETHASONE SODIUM PHOSPHATE 4 MG/ML IJ SOLN
INTRAMUSCULAR | Status: DC | PRN
  Administered 2012-03-23: 10 mg via INTRAVENOUS

## 2012-03-23 MED ORDER — STERILE WATER FOR IRRIGATION IR SOLN
Status: DC | PRN
  Administered 2012-03-23: 1000 mL

## 2012-03-23 MED ORDER — HYDROMORPHONE HCL PF 1 MG/ML IJ SOLN
0.2500 mg | INTRAMUSCULAR | Status: DC | PRN
  Filled 2012-03-23: qty 1

## 2012-03-23 MED ORDER — OXYCODONE HCL 5 MG PO TABS
5.0000 mg | ORAL_TABLET | ORAL | Status: DC | PRN
  Filled 2012-03-23: qty 2

## 2012-03-23 MED ORDER — SODIUM CHLORIDE 0.9 % IV SOLN
250.0000 mL | INTRAVENOUS | Status: DC | PRN
  Filled 2012-03-23: qty 250

## 2012-03-23 MED ORDER — BELLADONNA ALKALOIDS-OPIUM 16.2-60 MG RE SUPP
RECTAL | Status: DC | PRN
  Administered 2012-03-23: 1 via RECTAL

## 2012-03-23 MED ORDER — PROPOFOL 10 MG/ML IV BOLUS
INTRAVENOUS | Status: DC | PRN
  Administered 2012-03-23: 200 mg via INTRAVENOUS

## 2012-03-23 MED ORDER — ACETAMINOPHEN 10 MG/ML IV SOLN
INTRAVENOUS | Status: DC | PRN
  Administered 2012-03-23: 1000 mg via INTRAVENOUS

## 2012-03-23 MED ORDER — ONDANSETRON HCL 4 MG/2ML IJ SOLN
4.0000 mg | Freq: Four times a day (QID) | INTRAMUSCULAR | Status: DC | PRN
  Filled 2012-03-23: qty 2

## 2012-03-23 MED ORDER — SODIUM CHLORIDE 0.9 % IJ SOLN
3.0000 mL | Freq: Two times a day (BID) | INTRAMUSCULAR | Status: DC
  Filled 2012-03-23: qty 3

## 2012-03-23 MED ORDER — LACTATED RINGERS IV SOLN
INTRAVENOUS | Status: DC
  Filled 2012-03-23: qty 1000

## 2012-03-23 MED ORDER — ACETAMINOPHEN 325 MG PO TABS
650.0000 mg | ORAL_TABLET | ORAL | Status: DC | PRN
  Filled 2012-03-23: qty 2

## 2012-03-23 MED ORDER — CIPROFLOXACIN IN D5W 400 MG/200ML IV SOLN
400.0000 mg | INTRAVENOUS | Status: AC
  Administered 2012-03-23: 400 mg via INTRAVENOUS
  Filled 2012-03-23: qty 200

## 2012-03-23 MED ORDER — ONDANSETRON HCL 4 MG/2ML IJ SOLN
INTRAMUSCULAR | Status: DC | PRN
  Administered 2012-03-23: 4 mg via INTRAVENOUS

## 2012-03-23 MED ORDER — LACTATED RINGERS IV SOLN
INTRAVENOUS | Status: DC
  Administered 2012-03-23 (×3): via INTRAVENOUS
  Filled 2012-03-23: qty 1000

## 2012-03-23 MED ORDER — IOHEXOL 350 MG/ML SOLN
INTRAVENOUS | Status: DC | PRN
  Administered 2012-03-23: 2 mL

## 2012-03-23 MED ORDER — FENTANYL CITRATE 0.05 MG/ML IJ SOLN
25.0000 ug | INTRAMUSCULAR | Status: DC | PRN
  Filled 2012-03-23: qty 1

## 2012-03-23 MED ORDER — PROMETHAZINE HCL 25 MG/ML IJ SOLN
6.2500 mg | INTRAMUSCULAR | Status: DC | PRN
  Filled 2012-03-23: qty 1

## 2012-03-23 MED ORDER — MIDAZOLAM HCL 5 MG/5ML IJ SOLN
INTRAMUSCULAR | Status: DC | PRN
  Administered 2012-03-23 (×2): 1 mg via INTRAVENOUS

## 2012-03-23 MED ORDER — PHENAZOPYRIDINE HCL 200 MG PO TABS: 200.0000 mg | ORAL_TABLET | Freq: Three times a day (TID) | ORAL | Status: DC | PRN

## 2012-03-23 MED ORDER — HYOSCYAMINE SULFATE 0.125 MG SL SUBL: 0.1250 mg | SUBLINGUAL_TABLET | SUBLINGUAL | Status: DC | PRN

## 2012-03-23 MED ORDER — SODIUM CHLORIDE 0.9 % IJ SOLN
3.0000 mL | INTRAMUSCULAR | Status: DC | PRN
  Filled 2012-03-23: qty 3

## 2012-03-23 SURGICAL SUPPLY — 33 items
BAG DRAIN URO-CYSTO SKYTR STRL (DRAIN) ×2 IMPLANT
BASKET LASER NITINOL 1.9FR (BASKET) IMPLANT
BASKET STNLS GEMINI 4WIRE 3FR (BASKET) IMPLANT
BASKET ZERO TIP NITINOL 2.4FR (BASKET) IMPLANT
BRUSH URET BIOPSY 3F (UROLOGICAL SUPPLIES) IMPLANT
CANISTER SUCT LVC 12 LTR MEDI- (MISCELLANEOUS) ×2 IMPLANT
CATH URET 5FR 28IN CONE TIP (BALLOONS)
CATH URET 5FR 28IN OPEN ENDED (CATHETERS) ×2 IMPLANT
CATH URET 5FR 70CM CONE TIP (BALLOONS) IMPLANT
CLOTH BEACON ORANGE TIMEOUT ST (SAFETY) ×2 IMPLANT
DRAPE CAMERA CLOSED 9X96 (DRAPES) ×2 IMPLANT
ELECT REM PT RETURN 9FT ADLT (ELECTROSURGICAL)
ELECTRODE REM PT RTRN 9FT ADLT (ELECTROSURGICAL) IMPLANT
GLOVE BIOGEL M 6.5 STRL (GLOVE) ×4 IMPLANT
GLOVE BIOGEL M STER SZ 6 (GLOVE) ×2 IMPLANT
GLOVE SURG SS PI 8.0 STRL IVOR (GLOVE) ×2 IMPLANT
GOWN STRL NON-REIN LRG LVL3 (GOWN DISPOSABLE) ×2 IMPLANT
GOWN STRL REIN XL XLG (GOWN DISPOSABLE) ×2 IMPLANT
GUIDEWIRE 0.038 PTFE COATED (WIRE) IMPLANT
GUIDEWIRE ANG ZIPWIRE 038X150 (WIRE) IMPLANT
GUIDEWIRE STR DUAL SENSOR (WIRE) ×4 IMPLANT
IV NS IRRIG 3000ML ARTHROMATIC (IV SOLUTION) ×4 IMPLANT
KIT BALLIN UROMAX 15FX10 (LABEL) ×1 IMPLANT
KIT BALLN UROMAX 15FX4 (MISCELLANEOUS) IMPLANT
KIT BALLN UROMAX 26 75X4 (MISCELLANEOUS)
LASER FIBER DISP (UROLOGICAL SUPPLIES) ×2 IMPLANT
LASER FIBER DISP 1000U (UROLOGICAL SUPPLIES) IMPLANT
PACK CYSTOSCOPY (CUSTOM PROCEDURE TRAY) ×2 IMPLANT
SET HIGH PRES BAL DIL (LABEL) ×1
SHEATH ACCESS URETERAL 38CM (SHEATH) ×2 IMPLANT
SHEATH ACCESS URETERAL 54CM (SHEATH) IMPLANT
STENT URET 6FRX24 CONTOUR (STENTS) ×2 IMPLANT
SYR 30ML LL (SYRINGE) ×2 IMPLANT

## 2012-03-23 NOTE — H&P (View-Only) (Signed)
ctive Problems Problems  1. Nephrolithiasis Of The Right Kidney 592.0  History of Present Illness     Frances Watkins returns today in f/u.  She had ESWL for a right renal stone on 11/01/11.  She passed a lot of fragments and has no residual symptoms.  Her stone was calcium oxalate.  She has no associated signs or symptoms, but her KUB today shows a 7mm renal pelvic stone.   Past Medical History Problems  1. History of  Diffuse Abdominal Pain Right 789.07 2. History of  Gross Hematuria 599.71 3. History of  Hypertension 401.9 4. History of  Murmurs 785.2 5. History of  Proximal Ureteral Stone On The Right 592.1 6. History of  Psoriasis 696.1 7. History of  Pyuria 791.9  Surgical History Problems  1. History of  Lithotripsy 2. History of  Lithotripsy 3. History of  Lithotripsy 4. History of  Mastoidectomy, Left Ear 5. History of  Tonsillectomy  Current Meds 1. Humira Pen 40 MG/0.8ML Subcutaneous Kit; Therapy: 03Jan2014 to 2. Mirena IUD; Therapy: (Recorded:24Sep2013) to  Allergies Medication  1. Sulfa Drugs  Family History Problems  1. Paternal history of  Diabetes Mellitus V18.0 2. Paternal uncle's history of  Diabetes Mellitus V18.0 3. Family history of  Family Health Status - Father's Age 74 4. Family history of  Family Health Status - Mother's Age 22 5. Family history of  Family Health Status Number Of Children 2 daughters 6. Paternal history of  Hyperlipidemia 7. Paternal history of  Hypertension V17.49  Social History Problems  1. Alcohol Use 2-4 a month 2. Caffeine Use 1-2 a day 3. Marital History - Currently Married 4. 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  Gastrointestinal: no flank pain.    Vitals Vital Signs [Data Includes: Last 1 Day]  05Feb2014 12:14PM  Blood Pressure: 184 / 96 Heart Rate: 57  Physical Exam Constitutional: Well nourished and well developed . No acute distress.   Pulmonary: No respiratory distress and normal respiratory rhythm and effort.  Cardiovascular: Heart rate and rhythm are normal . No peripheral edema.    Results/Data Urine [Data Includes: Last 1 Day]   05Feb2014  COLOR STRAW   APPEARANCE CLEAR   SPECIFIC GRAVITY <1.005   pH 5.5   GLUCOSE NEG mg/dL  BILIRUBIN NEG   KETONE NEG mg/dL  BLOOD NEG   PROTEIN NEG mg/dL  UROBILINOGEN 0.2 mg/dL  NITRITE NEG   LEUKOCYTE ESTERASE NEG    The following images/tracing/specimen were independently visualized:  KUB shows a 7-75mm stone that was in the RUP on her prior film, but it now looks like it is in the renal pelvis. There is an IUD but no other abnormalities. A right renal US confirmed the renal pelvic stone that is 6mm. There is mild right hydro. See the study sheet for measurements and details.    Assessment Assessed  1. Nephrolithiasis Of The Right Kidney 592.0   She has a persistent 7mm fragment that is now in the right renal pelvis.  She has minimal hydro and no symptoms.   Plan Health Maintenance (V70.0)  1. UA With REFLEX  Done: 05Feb2014 11:25AM Nephrolithiasis Of The Right Kidney (592.0)  2. HYPERCALCIURA PROFILE  Requested for: 05Feb2014 3. KUB  Requested for: 05Feb2014 4. RENAL U/S RIGHT  Done: 05Feb2014 12:00AM 5. STONE RISK  Requested for: 05Feb2014 6. Follow-up Month x 3 Office  Follow-up  Requested for: 05Feb2014   I discussed the  options for treatment. The stone has survived 3 ESWL's over the years so I recommended ureteroscopy vs PCNL, but for this sized stone I think ureteroscopy would be best.  I reviewed the risks of bleeding, infection, ureteral and renal injury, need for a stent or second procedures, thrombotic events and anesthetic complications. I will get a hypercalcuria profile and stone risk analysis. She is going to think about the options and will get back to me.  Tentatively I will put her down for a 3 month f/u and KUB since she may want to watch it for now  since she has no symptoms.

## 2012-03-23 NOTE — Brief Op Note (Signed)
03/23/2012  11:35 AM  PATIENT:  Frances Watkins  47 y.o. female  PRE-OPERATIVE DIAGNOSIS:  RIGHT RENAL STONE  POST-OPERATIVE DIAGNOSIS:  RIGHT RENAL STONE  PROCEDURE:  Procedure(s) with comments: CYSTOSCOPY WITH URETEROSCOPY, REMOVAL STONE WITH HOLMIUN LASER  AND STENT PLACEMENT (Right) - CYSTOSCOPY URETEROSCOPY, STONE EXTRACTION WITH BASKET HOLMIUM LASER APPLICATION (Right)  SURGEON:  Surgeon(s) and Role:    * Anner Crete, MD - Primary  PHYSICIAN ASSISTANT:   ASSISTANTS: none   ANESTHESIA:   general  EBL:  Total I/O In: 1400 [I.V.:1400] Out: -   BLOOD ADMINISTERED:none  DRAINS: right 6x24JJ stent.    LOCAL MEDICATIONS USED:  NONE  SPECIMEN:  Source of Specimen:  right kidney stone fragments  DISPOSITION OF SPECIMEN:  to family  COUNTS:  YES  TOURNIQUET:  * No tourniquets in log *  DICTATION: .Other Dictation: Dictation Number [id number]  PLAN OF CARE: Discharge to home after PACU  PATIENT DISPOSITION:  PACU - hemodynamically stable.   Delay start of Pharmacological VTE agent (>24hrs) due to surgical blood loss or risk of bleeding: not applicable

## 2012-03-23 NOTE — Transfer of Care (Signed)
Immediate Anesthesia Transfer of Care Note  Patient: Frances Watkins  Procedure(s) Performed: Procedure(s) (LRB): CYSTOSCOPY WITH URETEROSCOPY, REMOVAL STONE WITH HOLMIUN LASER  AND STENT PLACEMENT (Right) HOLMIUM LASER APPLICATION (Right)  Patient Location: Patient transported to PACU with oxygen via face mask at 4 Liters / Min  Anesthesia Type: General  Level of Consciousness: awake and alert   Airway & Oxygen Therapy: Patient Spontanous Breathing and Patient connected to face mask oxygen  Post-op Assessment: Report given to PACU RN and Post -op Vital signs reviewed and stable  Post vital signs: Reviewed and stable  Dentition: Teeth and oropharynx remain in pre-op condition  Complications: No apparent anesthesia complications

## 2012-03-23 NOTE — Anesthesia Postprocedure Evaluation (Signed)
Anesthesia Post Note  Patient: Frances Watkins  Procedure(s) Performed: Procedure(s) (LRB): CYSTOSCOPY WITH URETEROSCOPY, REMOVAL STONE WITH HOLMIUN LASER  AND STENT PLACEMENT (Right) HOLMIUM LASER APPLICATION (Right)  Anesthesia type: General  Patient location: PACU  Post pain: Pain level controlled  Post assessment: Post-op Vital signs reviewed  Last Vitals:  Filed Vitals:   03/23/12 1200  BP: 164/99  Pulse: 68  Temp:   Resp: 14    Post vital signs: Reviewed  Level of consciousness: sedated  Complications: No apparent anesthesia complications

## 2012-03-23 NOTE — Anesthesia Preprocedure Evaluation (Addendum)
Anesthesia Evaluation  Patient identified by MRN, date of birth, ID band Patient awake    Reviewed: Allergy & Precautions, H&P , NPO status , Patient's Chart, lab work & pertinent test results  History of Anesthesia Complications (+) PONV  Airway Mallampati: II TM Distance: >3 FB Neck ROM: Full    Dental  (+) Teeth Intact and Dental Advisory Given   Pulmonary neg pulmonary ROS,  breath sounds clear to auscultation  Pulmonary exam normal       Cardiovascular negative cardio ROS  + Valvular Problems/Murmurs Rhythm:Regular Rate:Normal     Neuro/Psych negative neurological ROS  negative psych ROS   GI/Hepatic negative GI ROS, Neg liver ROS,   Endo/Other  negative endocrine ROS  Renal/GU negative Renal ROS  negative genitourinary   Musculoskeletal negative musculoskeletal ROS (+)   Abdominal   Peds  Hematology negative hematology ROS (+)   Anesthesia Other Findings   Reproductive/Obstetrics negative OB ROS                           Anesthesia Physical Anesthesia Plan  ASA: I  Anesthesia Plan: General   Post-op Pain Management:    Induction: Intravenous  Airway Management Planned: LMA  Additional Equipment:   Intra-op Plan:   Post-operative Plan: Extubation in OR  Informed Consent: I have reviewed the patients History and Physical, chart, labs and discussed the procedure including the risks, benefits and alternatives for the proposed anesthesia with the patient or authorized representative who has indicated his/her understanding and acceptance.   Dental advisory given  Plan Discussed with: CRNA  Anesthesia Plan Comments:         Anesthesia Quick Evaluation

## 2012-03-23 NOTE — Interval H&P Note (Signed)
History and Physical Interval Note:  03/23/2012 9:35 AM  Frances Watkins  has presented today for surgery, with the diagnosis of RIGHT RENAL STONE  The various methods of treatment have been discussed with the patient and family. After consideration of risks, benefits and other options for treatment, the patient has consented to  Procedure(s) with comments: CYSTOSCOPY WITH URETEROSCOPY, STONE BASKETRY AND STENT PLACEMENT (Right) - CYSTOSCOPY URETEROSCOPY, STONE EXTRACTION WITH BASKET, NO STENT PLACEMENT HOLMIUM LASER APPLICATION (Right) as a surgical intervention .  The patient's history has been reviewed, patient examined, no change in status, stable for surgery.  I have reviewed the patient's chart and labs.  Questions were answered to the patient's satisfaction.     Peder Allums J  At her last procedure I was not able to access the kidney so she is back today for definitive therapy.

## 2012-03-23 NOTE — Anesthesia Procedure Notes (Signed)
Procedure Name: LMA Insertion Date/Time: 03/23/2012 9:57 AM Performed by: Fran Lowes Pre-anesthesia Checklist: Patient identified, Emergency Drugs available, Suction available and Patient being monitored Patient Re-evaluated:Patient Re-evaluated prior to inductionOxygen Delivery Method: Circle System Utilized Preoxygenation: Pre-oxygenation with 100% oxygen Intubation Type: IV induction Ventilation: Mask ventilation without difficulty LMA: LMA inserted LMA Size: 4.0 Number of attempts: 1 Airway Equipment and Method: bite block Placement Confirmation: positive ETCO2 Tube secured with: Tape Dental Injury: Teeth and Oropharynx as per pre-operative assessment

## 2012-03-24 NOTE — Op Note (Signed)
NAME:  Frances Watkins, Frances Watkins NO.:  MEDICAL RECORD NO.:  [id number]  LOCATION:                                 FACILITY:  PHYSICIAN:  Excell Seltzer. Annabell Howells, M.D.    DATE OF BIRTH:  April 24, 1965  DATE OF PROCEDURE:  03/23/2012 DATE OF DISCHARGE:                              OPERATIVE REPORT   PROCEDURE: 1. Cystoscopy removal of right double-J stent. 2. Right ureteroscopy with holmium laser lithotripsy. 3. Placement of right double-J stent.  PREOPERATIVE DIAGNOSIS:  Right renal stone.  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  SURGEON:  Excell Seltzer. Annabell Howells, M.D.  ANESTHESIA:  General.  SPECIMEN:  Stone fragments.  DRAINS:  A 6-French 24 cm double-J stent.  COMPLICATIONS:  None.  INDICATIONS:  Frances Watkins is a 47 year old white female with a history of a right renal stone that was partially fragmented on 2 lithotripsies but not cleared.  She underwent an attempted ureteroscopy approximately a week ago and was found to have marked narrowing in the proximal ureter that was not readily dilated, so it was felt that stenting was indicated with return to the OR for second attempt at ureteroscopy.  FINDINGS OF PROCEDURE:  She was given Cipro.  She was taken to the operating room where general anesthetic was induced.  She was placed in lithotomy position.  Her perineum and genitalia were prepped with Betadine solution and she was draped in usual sterile fashion.  Cystoscopy was performed using a 22-French scope and 12-degree lens. Her urethra was snug somewhat again but I was able to get the scope with obturator.  Once in place, the stent loop was noted on the right.  This was grasped with grasping forceps and pulled to the urethral meatus. The bladder had been inspected, documented at the last procedure.  A guidewire was then passed through the stent to the kidney and the stent was removed.  Once the wire was in good position, the site of  the second wire would be worthwhile to provide a safety wire during the procedure, so the cystoscope was reinserted and a second wire was passed to the kidney.  At this point, attempt was made to pass a 12/14 35 cm digital access sheath over the working wire.  I was not able to advance it with the second wire in place.  I then removed one of the wires and reinserted the access sheath over the wire.  I removed the inner core end and while inspection of the proximal ureter revealed evidence of some dilation with the stent but it was still too narrow for passage of the digital flexible scope, so guidewire was reinserted to the kidney and the access sheath was removed.  A 10 cm 15-French balloon dilation catheter was placed over the wire across the narrowed area and inflated to 18 atmosphere the waste disappeared and the dilation was held for 2 minutes.  The balloon was deflated and removed.  At this point, I attempted once again the dual guidewire approach but was unable to advance the access sheath beyond the distal ureter.  At this point, I left the access  sheath into the distal ureter and removed the inner core and wire and inserted the digital flexible scope.  I was able to advance this to the kidney but felt more proximal placement of the access sheath was indicated.  At this point, the access sheath was removed and replaced over the remaining wire and advanced to the kidney.  The inner core and wire were then removed as the access sheath appeared to be within the renal pelvis.  The digital ureteroscope was then inserted over the wire and the kidney was inspected.  Initially, a very small stone was noted in the lower pole.  This was too small to be engaged with the basket.  I then identified the target stone in the mid pole calix and was initially unable to locate the infundibulum from the calyx, however, with injection of contrast through the scope, I was able to identify  the proper infundibulum and advanced the scope into the vicinity of the stone.  The stone was approximately 7 mm in size and appeared to have been partially fragmented by lithotripsy.  At this point, a 200 micron laser fiber was inserted.  The trial of the laser was set on 0.5 watts and 20 hertz and the stone was engaged.  It was initially broken into 2 pieces.  One of these pieces was then grasped with a basket and moved into the upper pole calices for easier targeting.  The stone was then engaged again with the laser.  At this time, the power was turned to 0.8 watts and 10 hertz and it was broken into small manageable fragments.  I then used a Nitinol basket to remove the bulk of the residual fragments.  Some bleeding occurred towards the of the procedure which prevented removal of final fragment that may have been approximately 3 mm.  The process had been monitored on fluoroscopy and no visible residual stone fragments were noted despite the radio uptake nature of the stone.  At this point, once all of the possible fragments had been retrieved, a guidewire was inserted back through the access sheath into the kidney. The access sheath was removed.  The cystoscope was reinserted over the wire.  A 6-French 24 cm double-J stent without string was inserted over the wire to the kidney.  The wire was removed leaving good coil in the bladder and good coil in the kidney.  The bladder was drained, cystoscope was removed.  The B and O suppository was placed.  She was taken down from lithotomy position.  Her anesthetic was reversed and she was moved to recovery room in stable condition.  There were no complications.     Excell Seltzer. Annabell Howells, M.D.     JJW/MEDQ  D:  03/23/2012  T:  03/24/2012  Job:  [id number]

## 2012-03-27 ENCOUNTER — Encounter (HOSPITAL_BASED_OUTPATIENT_CLINIC_OR_DEPARTMENT_OTHER): Payer: Self-pay | Admitting: Urology

## 2012-10-03 ENCOUNTER — Telehealth: Payer: Self-pay | Admitting: Internal Medicine

## 2012-10-03 NOTE — Telephone Encounter (Signed)
Pt was current pt at [facility] and recently slipped and feel and hurt her knee. She was wanting to follow you but can't get in until [facility] and was wanting to know if she could see you and if possible sooner than November ???

## 2012-10-03 NOTE — Telephone Encounter (Signed)
If she fell and hurt her knee, I would recommend her going ahead and being seen - for this acute problem.  (her current md or acc)

## 2012-10-04 NOTE — Telephone Encounter (Signed)
This is the wrong patient chart (correct DOB & Phone #), I have created a message for the correct pt in the correct chart.

## 2018-09-07 NOTE — Progress Notes (Signed)
Office Visit Note  Patient: Frances Watkins             Date of Birth: Aug 11, 1965           MRN: [id number]             PCP: Alinda DeemPenner, Pamela, MD Referring: Cherly BeachKimmel, Steve W, MD Visit Date: 09/21/2018 Occupation: Sales assistant  Subjective:  Pain in multiple joints.   History of Present Illness: Frances Watkins is a 53 y.o. female seen in consultation per request of Dr. Fatima BlankKimmel.  According to patient she started having psoriasis in her 5320s mostly limited to her scalp.  She states in her late 30s she developed psoriasis on extremities as well.  She was seen by dermatologist and was prescribed topical agents.  She recalls taking methotrexate for short.  And had to discontinue due to the side effects.  She states 2013 she was tried on Humira which she took only for 3 months that she was concerned about the side effects of the medications.  She continued using topical agents only.  She has done some dietary modifications which helped to some extent.  She states over the last 2 years she has been experiencing increased joint pain especially in her left ankle joint.  And in the last 1 year she has been experiencing increased pain in her both elbows, bilateral hands, bilateral knees, bilateral ankles, left Achilles tendon and her plantar fascia.  She states she has discomfort in her left Achilles tendon every morning.  She has difficulty walking due to bilateral plantar fasciitis.  She states about a month ago she woke up with swollen right middle finger and she was able to bend it.  It resolved itself after a few days.  She also has some lower back pain.  There is positive history of psoriatic arthritis and psoriasis and her daughter who is been on Enbrel for the last 5 years.  Activities of Daily Living:  Patient reports morning stiffness for 1 hour.   Patient Denies nocturnal pain.  Difficulty dressing/grooming: Denies Difficulty climbing stairs: Reports Difficulty getting out of chair: Reports  Difficulty using hands for taps, buttons, cutlery, and/or writing: Reports  Review of Systems  Constitutional: Positive for fatigue. Negative for night sweats, weight gain and weight loss.  HENT: Negative for mouth sores, trouble swallowing, trouble swallowing, mouth dryness and nose dryness.   Eyes: Negative for pain, redness, visual disturbance and dryness.  Respiratory: Negative for cough, shortness of breath and difficulty breathing.   Cardiovascular: Negative for chest pain, palpitations, hypertension, irregular heartbeat and swelling in legs/feet.  Gastrointestinal: Negative for blood in stool, constipation and diarrhea.  Endocrine: Negative for increased urination.  Genitourinary: Negative for vaginal dryness.  Musculoskeletal: Positive for arthralgias, joint pain, joint swelling and morning stiffness. Negative for myalgias, muscle weakness, muscle tenderness and myalgias.  Skin: Positive for rash. Negative for color change, hair loss, skin tightness, ulcers and sensitivity to sunlight.  Allergic/Immunologic: Negative for susceptible to infections.  Neurological: Negative for dizziness, memory loss, night sweats and weakness.  Hematological: Negative for swollen glands.  Psychiatric/Behavioral: Negative for depressed mood and sleep disturbance. The patient is nervous/anxious.     PMFS History:  Patient Active Problem List   Diagnosis Date Noted  . Psoriasis 09/21/2018  . Anxiety 09/21/2018  . Essential hypertension 09/21/2018  . History of kidney stones 09/21/2018    Past Medical History:  Diagnosis Date  . Heart murmur    no treatment  .  History of kidney stones   . PONV (postoperative nausea and vomiting)    no problem  03/14/12  . Psoriasis     Family History  Problem Relation Age of Onset  . Arthritis Mother   . Diabetes Father   . Hypertension Father   . High Cholesterol Father   . Heart disease Father   . Heart attack Father   . Gout Father   . Healthy Sister    . Psoriasis Daughter        psoriatic arthritis  . Healthy Daughter    Past Surgical History:  Procedure Laterality Date  . COLONOSCOPY     2014, 2019  . HOLMIUM LASER APPLICATION Right 03/23/2012   Procedure: HOLMIUM LASER APPLICATION;  Surgeon: Anner CreteJohn J Wrenn, MD;  [facility];  Service: Urology;  Laterality: Right;  . INNER EAR SURGERY  2000  . LITHOTRIPSY     x2  . TONSILLECTOMY     age 38 yrs.  . TYMPANOPLASTY W/ MASTOIDECTOMY     x2  . UPPER GI ENDOSCOPY     2014, 2019  . URETEROSCOPY  2014   Social History   Social History Narrative  . Not on file    There is no immunization history on file for this patient.   Objective: Vital Signs: BP (!) 169/99 (BP Location: Right Arm, Patient Position: Sitting, Cuff Size: Normal)   Pulse (!) 51   Ht 5\' 3"  (1.6 m)   Wt 163 lb (73.9 kg)   BMI 28.87 kg/m    Physical Exam Vitals signs and nursing note reviewed.  Constitutional:      Appearance: She is well-developed.  HENT:     Head: Normocephalic and atraumatic.  Eyes:     Conjunctiva/sclera: Conjunctivae normal.  Neck:     Musculoskeletal: Normal range of motion.  Cardiovascular:     Rate and Rhythm: Normal rate and regular rhythm.     Heart sounds: Normal heart sounds.  Pulmonary:     Effort: Pulmonary effort is normal.     Breath sounds: Normal breath sounds.  Abdominal:     General: Bowel sounds are normal.     Palpations: Abdomen is soft.  Lymphadenopathy:     Cervical: No cervical adenopathy.  Skin:    General: Skin is warm and dry.     Capillary Refill: Capillary refill takes less than 2 seconds.     Comments: Multiple patches of psoriasis were noted on her scalp, face, upper and lower extremities and trunk. Nail pitting was noted.  Neurological:     Mental Status: She is alert and oriented to person, place, and time.  Psychiatric:        Behavior: Behavior normal.      Musculoskeletal Exam: C-spine thoracic and lumbar spine  were in good range of motion.  She had tenderness on palpation of her left SI joint.  Shoulder joints, elbow joints, wrist joints, MCPs PIPs and DIPs with good range of motion.  She had tenderness over bilateral lateral epicondyle area.  She has tenderness on palpation over left second and third PIP joints and second DIP joint.  Some DIP thickening was noted bilaterally.  Hip joints and knee joints with good range of motion.  She had bilateral plantar fasciitis and left Achilles tendinitis.  CDAI Exam: CDAI Score: - Patient Global: -; Provider Global: - Swollen: -; Tender: - Joint Exam   No joint exam has been documented for this visit   There  is currently no information documented on the homunculus. Go to the Rheumatology activity and complete the homunculus joint exam.  Investigation: No additional findings.  Imaging: Xr Foot 2 Views Left  Result Date: 09/21/2018 First MTP, PIP and DIP narrowing was noted.  No erosive changes were noted.  No intertarsal or tibiotalar joint space narrowing was noted.  Inferior calcaneal spur was noted. Impression: These findings are consistent with osteoarthritis of the foot.  Xr Foot 2 Views Right  Result Date: 09/21/2018 First MTP, PIP and DIP narrowing was noted.  No erosive changes were noted.  No intertarsal or tibiotalar joint space narrowing was noted. Impression: These findings are consistent with osteoarthritis of the foot.  Xr Hand 2 View Left  Result Date: 09/21/2018 Mild CMC PIP and DIP narrowing was noted.  No erosive changes were noted.  No MCP, intercarpal radiocarpal or metacarpocarpal joint space narrowing was noted.  No erosive changes were noted. Impression: These findings are consistent with osteoarthritis of the hand.  Xr Hand 2 View Right  Result Date: 09/21/2018 Minimal CMC PIP and DIP narrowing was noted.  No MCP, intercarpal or radiocarpal joint space narrowing was noted.  No erosive changes were noted. Impression: These  findings are consistent with mild osteoarthritis of the hand.  Xr Pelvis 1-2 Views  Result Date: 09/21/2018 No SI joint narrowing was noted.  Right SI joint to sclerosis was noted. 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   Recent Labs: Lab Results  Component Value Date   WBC 5.9 03/14/2012   HGB 13.9 03/14/2012   PLT 264 03/14/2012    Speciality Comments: No specialty comments available.  Procedures:  No procedures performed Allergies: Sulfa drugs cross reactors   Assessment / Plan:     Visit Diagnoses: Psoriatic arthritis (HCC)-patient has history of pain in multiple joints.  She had recent episode of dactylitis in her right third finger.  She has tenderness over some of her PIPs and DIPs but no active synovitis was noted.  She has left Achilles tendon tendinitis, bilateral plantar fasciitis, pain in multiple joints and left sacroiliitis.  There is family history of psoriatic arthritis in her daughter.  She has longstanding history of psoriasis.  Patient had intolerance to methotrexate in the past.  She was treated with Humira for 3 months several years ago but she discontinued as she was concerned about the side effects.  Her daughter has psoriatic arthritis and she has been on Enbrel.  Indications side effects contraindications of different medications were discussed at length today.  She is inclined towards going on Enbrel.  I will obtain labs and x-rays today.  Once the labs are available we can start her on Enbrel.  Pain in both hands -she has pain in bilateral hands.  She had recent episode of dactylitis.  She has some tenderness over PIPs and DIPs.  Plan: XR Hand 2 View Right, XR Hand 2 View Left.  X-rays were consistent with osteoarthritis.  Chronic SI joint pain -she has tenderness over left SI joint.  Plan: XR Pelvis 1-2 Views.  X-rays showed right SI joint sclerosis.  Pain in both feet -she has discomfort in her PIPs and DIPs but no synovitis  was noted.  Plan: XR Foot 2 Views Right, XR Foot 2 Views Left.  X-ray showed bilateral mild osteoarthritis.  Plantar fasciitis, bilateral-she has bilateral plantar fasciitis and tenderness on palpation.  Achilles tendinitis, left leg-she has tenderness over left Achilles tendon.  Psoriasis-patient has several  patches of psoriasis.  She also has nail pitting.  High risk medication use - MTX and folic acid prescribed by dermatologist6/15/20: CBC WNL, CMP WNL, TB gold negative, hep panel negative - Plan: CBC with Differential/Platelet, COMPLETE METABOLIC PANEL WITH GFR, Sedimentation rate, Hepatitis B core antibody, IgM, Hepatitis B surface antigen, Hepatitis C antibody, QuantiFERON-TB Gold Plus, HIV Antibody (routine testing w rflx), Serum protein electrophoresis with reflex, IgG, IgA, IgM, DG Chest 2 View  Other medical problems are listed as follows:  Solar elastosis  Anxiety  Essential hypertension  History of kidney stones  Orders: Orders Placed This Encounter  Procedures  . XR Hand 2 View Right  . XR Hand 2 View Left  . XR Foot 2 Views Right  . XR Foot 2 Views Left  . XR Pelvis 1-2 Views  . DG Chest 2 View  . CBC with Differential/Platelet  . COMPLETE METABOLIC PANEL WITH GFR  . Sedimentation rate  . Hepatitis B core antibody, IgM  . Hepatitis B surface antigen  . Hepatitis C antibody  . QuantiFERON-TB Gold Plus  . HIV Antibody (routine testing w rflx)  . Serum protein electrophoresis with reflex  . IgG, IgA, IgM  . CBC with Differential/Platelet  . COMPLETE METABOLIC PANEL WITH GFR   No orders of the defined types were placed in this encounter.   Face-to-face time spent with patient was 50 minutes. Greater than 50% of time was spent in counseling and coordination of care.  Follow-Up Instructions: Return for Psoriatic arthritis.   Pollyann SavoyShaili Yovanni Frenette, MD  Note - This record has been created using Animal nutrition[employer]agon software.  Chart creation errors have been sought, but may  not always  have been located. Such creation errors do not reflect on  the standard of medical care.

## 2018-09-21 ENCOUNTER — Ambulatory Visit: Payer: Self-pay

## 2018-09-21 ENCOUNTER — Other Ambulatory Visit: Payer: Self-pay

## 2018-09-21 ENCOUNTER — Ambulatory Visit (INDEPENDENT_AMBULATORY_CARE_PROVIDER_SITE_OTHER): Payer: No Typology Code available for payment source

## 2018-09-21 ENCOUNTER — Ambulatory Visit (HOSPITAL_COMMUNITY)
Admission: RE | Admit: 2018-09-21 | Discharge: 2018-09-21 | Disposition: A | Discharge status: Home / Self Care | Payer: No Typology Code available for payment source | Source: Ambulatory Visit | Attending: Rheumatology | Admitting: Rheumatology

## 2018-09-21 ENCOUNTER — Telehealth: Payer: Self-pay | Admitting: Pharmacist

## 2018-09-21 ENCOUNTER — Encounter: Payer: Self-pay | Admitting: Rheumatology

## 2018-09-21 ENCOUNTER — Ambulatory Visit (INDEPENDENT_AMBULATORY_CARE_PROVIDER_SITE_OTHER): Payer: No Typology Code available for payment source | Admitting: Rheumatology

## 2018-09-21 VITALS — BP 169/99 | HR 51 | Ht 63.0 in | Wt 163.0 lb

## 2018-09-21 DIAGNOSIS — M79672 Pain in left foot: Secondary | ICD-10-CM | POA: Diagnosis not present

## 2018-09-21 DIAGNOSIS — I10 Essential (primary) hypertension: Secondary | ICD-10-CM | POA: Insufficient documentation

## 2018-09-21 DIAGNOSIS — M533 Sacrococcygeal disorders, not elsewhere classified: Secondary | ICD-10-CM | POA: Diagnosis not present

## 2018-09-21 DIAGNOSIS — M79641 Pain in right hand: Secondary | ICD-10-CM

## 2018-09-21 DIAGNOSIS — M79671 Pain in right foot: Secondary | ICD-10-CM | POA: Diagnosis not present

## 2018-09-21 DIAGNOSIS — F419 Anxiety disorder, unspecified: Secondary | ICD-10-CM | POA: Insufficient documentation

## 2018-09-21 DIAGNOSIS — M79642 Pain in left hand: Secondary | ICD-10-CM

## 2018-09-21 DIAGNOSIS — M7662 Achilles tendinitis, left leg: Secondary | ICD-10-CM

## 2018-09-21 DIAGNOSIS — Z79899 Other long term (current) drug therapy: Secondary | ICD-10-CM

## 2018-09-21 DIAGNOSIS — L4050 Arthropathic psoriasis, unspecified: Secondary | ICD-10-CM | POA: Diagnosis not present

## 2018-09-21 DIAGNOSIS — G8929 Other chronic pain: Secondary | ICD-10-CM

## 2018-09-21 DIAGNOSIS — M722 Plantar fascial fibromatosis: Secondary | ICD-10-CM

## 2018-09-21 DIAGNOSIS — L578 Other skin changes due to chronic exposure to nonionizing radiation: Secondary | ICD-10-CM

## 2018-09-21 DIAGNOSIS — Z87442 Personal history of urinary calculi: Secondary | ICD-10-CM | POA: Insufficient documentation

## 2018-09-21 DIAGNOSIS — L409 Psoriasis, unspecified: Secondary | ICD-10-CM

## 2018-09-21 HISTORY — DX: Essential (primary) hypertension: I10

## 2018-09-21 HISTORY — DX: Anxiety disorder, unspecified: F41.9

## 2018-09-21 NOTE — Telephone Encounter (Signed)
Please start BIV for Enbrel.  She has psoriatic arthritis/plaque psoriasis overlap and previously treated with Humira and oral methotrexate.

## 2018-09-21 NOTE — Progress Notes (Signed)
Pharmacy Note  Subjective: Patient presents today to the [facility] to see Dr. Estanislado Pandy.   Patient seen by the pharmacist for counseling on Enbrel for psoriatic arthritis and plaque psoriasis.  She has been on Humira and methotrexate in the past.  Objective:  OEH:[id number] 09/21/2018   NOI:[id number] 09/21/2018     Baseline Immunosuppressant Therapy Labs TB GOLD: negative 07/17/2018 per dermatology notes  Hepatitis Panel:negative 07/17/2018 per dermatology notes  BVQ:[id number] 09/21/2018  Immunoglobulins:pending 09/21/2018  SPEP:pending 09/21/2018  G6PD No results found for: G6PDH TPMT No results found for: TPMT   Chest x-ray: pending 09/21/2018  Does patient have diagnosis of heart failure?  No  Assessment/Plan:  Counseled patient that Enbrel is a TNF blocking agent. Counseled patient on purpose, proper use, and adverse effects of Enbrel.  Reviewed the most common adverse effects including infections, headache, and injection site reactions. Discussed that there is the possibility of an increased risk of malignancy but it is not well understood if this increased risk is due to the medication or the disease state.  Advised patient to get yearly dermatology exams due to risk of skin cancer.  She had a skin examine June.  Counseled patient that Enbrel should be held prior to scheduled surgery.  Counseled patient to avoid live vaccines while on Enbrel. Recommend annual influenza, Pneumovax 23, Prevnar 13, and Shingrix as indicated.   Reviewed the importance of regular labs while on Enbrel therapy.  Advised patient to get standing labs one month after starting Enbrel then every 2 months.  Provided patient with standing lab orders.  Provided patient with medication education material and answered all questions.  Patient voiced understanding.  Patient consented to Enbrel.  Will upload consent into the media tab.  Reviewed storage instructions for Enbrel.  Advised initial injection must be  administered in office.  Patient voiced understanding.    Patient dose will be Enbrel 50 mg every 7 days.  Prescription pending labs, chest x-ray and  Insurance approval.  Mariella Saa, PharmD, [place], [place] [facility] Pharmacist [phone]  09/21/2018 11:32 AM

## 2018-09-21 NOTE — Telephone Encounter (Signed)
Called to check on patient's benefits and her insurance plan's pharmacy benefit is not a formal Buyer, retail. This plan requires the patient to pay out of pocket then submit for reimbursement based on plan limits for high dollar medications. Rep advised that patients are usually referred to Patient Assistance Programs for these type of medications. Please have patient start [employer] PAP application for Enbrel.  Phone# [phone]  12:29 PM Beatriz Chancellor, CPhT

## 2018-09-21 NOTE — Patient Instructions (Addendum)
Please obtain Chest X-ray.  Order has been placed at [facility].  Go to main entrance and ask for radiology department.  Standing Labs We placed an order today for your standing lab work.    Please come back and get your standing labs after starting Enbrel in 1 month and then every 3 months.  We have open lab daily Monday through Thursday from 8:30-12:30 PM and 1:30-4:30 PM and Friday from 8:30-12:30 PM and 1:30 -4:00 PM at the office of Dr. Pollyann SavoyShaili Deveshwar.   You may experience shorter wait times on Monday and Friday afternoons. The office is located at [address], [address], [place], [place] [address] No appointment is necessary.   Labs are drawn by [facility].  You may receive a bill from [facility] for your lab work.  If you wish to have your labs drawn at another location, please call the office 24 hours in advance to send orders.  If you have any questions regarding directions or hours of operation,  please call [phone].   Just as a reminder please drink plenty of water prior to coming for your lab work. Thanks!  Vaccines You are taking a medication(s) that can suppress your immune system.  The following immunizations are recommended: . Flu annually . Pneumonia (Pneumovax 23 and Prevnar 13 spaced at least 1 year apart) . Shingrix  Please check with your PCP to make sure you are up to date.  In addition to staying up to date on lab work and vaccines it is important to:  Marland Kitchen. Yearly skin checks with dermatologist due to increase risk in skin cancer with TNF inhibitors .  Etanercept injection What is this medicine? ETANERCEPT (et a Motorola[employer] sept) is used for the treatment of rheumatoid arthritis in adults and children. The medicine is also used to treat psoriatic arthritis, ankylosing spondylitis, and psoriasis. This medicine may be used for other purposes; ask your health care provider or pharmacist if you have questions. COMMON BRAND NAME(S): Enbrel What should I tell my health  care provider before I take this medicine? They need to know if you have any of these conditions:  blood disorders  cancer  congestive heart failure  diabetes  exposure to chickenpox  immune system problems  infection  multiple sclerosis  seizure disorder  tuberculosis, a positive skin test for tuberculosis or have recently been in close contact with someone who has tuberculosis  Wegener's granulomatosis  an unusual or allergic reaction to etanercept, latex, other medicines, foods, dyes, or preservatives  pregnant or trying to get pregnant  breast-feeding How should I use this medicine? The medicine is given by injection under the skin. You will be taught how to prepare and give this medicine. Use exactly as directed. Take your medicine at regular intervals. Do not take your medicine more often than directed. It is important that you put your used needles and syringes in a special sharps container. Do not put them in a trash can. If you do not have a sharps container, call your pharmacist or healthcare provider to get one. A special MedGuide will be given to you by the pharmacist with each prescription and refill. Be sure to read this information carefully each time. Talk to your pediatrician regarding the use of this medicine in children. While this drug may be prescribed for children as young as 384 years of age for selected conditions, precautions do apply. Overdosage: If you think you have taken too much of this medicine contact a poison control  center or emergency room at once. NOTE: This medicine is only for you. Do not share this medicine with others. What if I miss a dose? If you miss a dose, contact your health care professional to find out when you should take your next dose. Do not take double or extra doses without advice. What may interact with this medicine? Do not take this medicine with any of the following medications:  anakinra This medicine may also  interact with the following medications:  cyclophosphamide  sulfasalazine  vaccines This list may not describe all possible interactions. Give your health care provider a list of all the medicines, herbs, non-prescription drugs, or dietary supplements you use. Also tell them if you smoke, drink alcohol, or use illegal drugs. Some items may interact with your medicine. What should I watch for while using this medicine? Tell your doctor or healthcare professional if your symptoms do not start to get better or if they get worse. You will be tested for tuberculosis (TB) before you start this medicine. If your doctor prescribes any medicine for TB, you should start taking the TB medicine before starting this medicine. Make sure to finish the full course of TB medicine. Call your doctor or health care professional for advice if you get a fever, chills or sore throat, or other symptoms of a cold or flu. Do not treat yourself. This drug decreases your body's ability to fight infections. Try to avoid being around people who are sick. What side effects may I notice from receiving this medicine? Side effects that you should report to your doctor or health care professional as soon as possible:  allergic reactions like skin rash, itching or hives, swelling of the face, lips, or tongue  changes in vision  fever, chills or any other sign of infection  numbness or tingling in legs or other parts of the body  red, scaly patches or raised bumps on the skin  shortness of breath or difficulty breathing  swollen lymph nodes in the neck, underarm, or groin areas  unexplained weight loss  unusual bleeding or bruising  unusual swelling or fluid retention in the legs  unusually weak or tired Side effects that usually do not require medical attention (report to your doctor or health care professional if they continue or are bothersome):  dizziness  headache  nausea  redness, itching, or swelling  at the injection site  vomiting This list may not describe all possible side effects. Call your doctor for medical advice about side effects. You may report side effects to FDA at 1-800-FDA-1088. Where should I keep my medicine? Keep out of the reach of children. Enbrel products: Store unopened Enbrel vials, cartridges, or pens in a refrigerator between 2 and 8 degrees C (36 and 46 degrees F). Do not freeze. Do not shake. Keep in the original container to protect from light. Throw away any unopened and unused medicine that has been stored in the refrigerator after the expiration date. If needed, you may store an Enbrel single-use vial, single-dose prefilled syringe, SureClick autoinjector, or Enbrel Mini cartridge at room temperature between 20 to 25 degrees C (68 to 77 degrees F) for up to 14 days. Protect from light, heat, and moisture. Do not shake. Once any of these items are stored at room temperature, do not place them back into the refrigerator. Throw the item away after 14 days, even if it still contains medicine. If you are using the Enbrel multi-dose vials, you will be instructed on  how to dilute and store this medicine once it has been diluted. The Enbrel AutoTouch reusable autoinjector device should be stored at room temperature. Do NOT refrigerate the AutoTouch reusable autoinjector. Erelzi products: Store Actuary pen in the refrigerator between 2 and 8 degrees C (36 and 46 degrees F). Do not freeze. Do not shake. Keep in the original container to protect from light. Throw away any unopened and unused medicine that has been stored in the refrigerator after the expiration date. If needed, you may store the [facility] prefilled syringe or pen at room temperature between 20 to 25 degrees C (68 to 77 degrees F) for up to 28 days. Protect from light, heat, and moisture. Do not shake. Once any of these items are stored at room temperature, do not place them back into  the refrigerator. Throw the item away after 28 days, even if it still contains medicine. NOTE: This sheet is a summary. It may not cover all possible information. If you have questions about this medicine, talk to your doctor, pharmacist, or health care provider.  2020 Elsevier/Gold Standard (2018-04-17 09:49:58)  Etanercept injection What is this medicine? ETANERCEPT (et a [employer]) is used for the treatment of rheumatoid arthritis in adults and children. The medicine is also used to treat psoriatic arthritis, ankylosing spondylitis, and psoriasis. This medicine may be used for other purposes; ask your health care provider or pharmacist if you have questions. COMMON BRAND NAME(S): Enbrel What should I tell my health care provider before I take this medicine? They need to know if you have any of these conditions:  blood disorders  cancer  congestive heart failure  diabetes  exposure to chickenpox  immune system problems  infection  multiple sclerosis  seizure disorder  tuberculosis, a positive skin test for tuberculosis or have recently been in close contact with someone who has tuberculosis  Wegener's granulomatosis  an unusual or allergic reaction to etanercept, latex, other medicines, foods, dyes, or preservatives  pregnant or trying to get pregnant  breast-feeding How should I use this medicine? The medicine is given by injection under the skin. You will be taught how to prepare and give this medicine. Use exactly as directed. Take your medicine at regular intervals. Do not take your medicine more often than directed. It is important that you put your used needles and syringes in a special sharps container. Do not put them in a trash can. If you do not have a sharps container, call your pharmacist or healthcare provider to get one. A special MedGuide will be given to you by the pharmacist with each prescription and refill. Be sure to read this information carefully each  time. Talk to your pediatrician regarding the use of this medicine in children. While this drug may be prescribed for children as young as 63 years of age for selected conditions, precautions do apply. Overdosage: If you think you have taken too much of this medicine contact a poison control center or emergency room at once. NOTE: This medicine is only for you. Do not share this medicine with others. What if I miss a dose? If you miss a dose, contact your health care professional to find out when you should take your next dose. Do not take double or extra doses without advice. What may interact with this medicine? Do not take this medicine with any of the following medications:  anakinra This medicine may also interact with the following medications:  cyclophosphamide  sulfasalazine  vaccines This list may not describe all possible interactions. Give your health care provider a list of all the medicines, herbs, non-prescription drugs, or dietary supplements you use. Also tell them if you smoke, drink alcohol, or use illegal drugs. Some items may interact with your medicine. What should I watch for while using this medicine? Tell your doctor or healthcare professional if your symptoms do not start to get better or if they get worse. You will be tested for tuberculosis (TB) before you start this medicine. If your doctor prescribes any medicine for TB, you should start taking the TB medicine before starting this medicine. Make sure to finish the full course of TB medicine. Call your doctor or health care professional for advice if you get a fever, chills or sore throat, or other symptoms of a cold or flu. Do not treat yourself. This drug decreases your body's ability to fight infections. Try to avoid being around people who are sick. What side effects may I notice from receiving this medicine? Side effects that you should report to your doctor or health care professional as soon as  possible:  allergic reactions like skin rash, itching or hives, swelling of the face, lips, or tongue  changes in vision  fever, chills or any other sign of infection  numbness or tingling in legs or other parts of the body  red, scaly patches or raised bumps on the skin  shortness of breath or difficulty breathing  swollen lymph nodes in the neck, underarm, or groin areas  unexplained weight loss  unusual bleeding or bruising  unusual swelling or fluid retention in the legs  unusually weak or tired Side effects that usually do not require medical attention (report to your doctor or health care professional if they continue or are bothersome):  dizziness  headache  nausea  redness, itching, or swelling at the injection site  vomiting This list may not describe all possible side effects. Call your doctor for medical advice about side effects. You may report side effects to FDA at 1-800-FDA-1088. Where should I keep my medicine? Keep out of the reach of children. Enbrel products: Store unopened Enbrel vials, cartridges, or pens in a refrigerator between 2 and 8 degrees C (36 and 46 degrees F). Do not freeze. Do not shake. Keep in the original container to protect from light. Throw away any unopened and unused medicine that has been stored in the refrigerator after the expiration date. If needed, you may store an Enbrel single-use vial, single-dose prefilled syringe, SureClick autoinjector, or Enbrel Mini cartridge at room temperature between 20 to 25 degrees C (68 to 77 degrees F) for up to 14 days. Protect from light, heat, and moisture. Do not shake. Once any of these items are stored at room temperature, do not place them back into the refrigerator. Throw the item away after 14 days, even if it still contains medicine. If you are using the Enbrel multi-dose vials, you will be instructed on how to dilute and store this medicine once it has been diluted. The Enbrel AutoTouch  reusable autoinjector device should be stored at room temperature. Do NOT refrigerate the AutoTouch reusable autoinjector. Erelzi products: Store Probation officerrelzi prefilled syringes or Sensoready pen in the refrigerator between 2 and 8 degrees C (36 and 46 degrees F). Do not freeze. Do not shake. Keep in the original container to protect from light. Throw away any unopened and unused medicine that has been stored in the refrigerator after the expiration date. If  needed, you may store the [facility] prefilled syringe or pen at room temperature between 20 to 25 degrees C (68 to 77 degrees F) for up to 28 days. Protect from light, heat, and moisture. Do not shake. Once any of these items are stored at room temperature, do not place them back into the refrigerator. Throw the item away after 28 days, even if it still contains medicine. NOTE: This sheet is a summary. It may not cover all possible information. If you have questions about this medicine, talk to your doctor, pharmacist, or health care provider.  2020 Elsevier/Gold Standard (2018-04-17 09:49:58)

## 2018-09-25 LAB — COMPLETE METABOLIC PANEL WITH GFR
AG Ratio: 1.7 (calc) (ref 1.0–2.5)
ALT: 13 U/L (ref 6–29)
AST: 21 U/L (ref 10–35)
Albumin: 4.3 g/dL (ref 3.6–5.1)
Alkaline phosphatase (APISO): 55 U/L (ref 37–153)
BUN: 9 mg/dL (ref 7–25)
CO2: 27 mmol/L (ref 20–32)
Calcium: 9.7 mg/dL (ref 8.6–10.4)
Chloride: 105 mmol/L (ref 98–110)
Creat: 0.66 mg/dL (ref 0.50–1.05)
GFR, Est African American: 119 mL/min/{1.73_m2} (ref >=60)
GFR, Est Non African American: 102 mL/min/{1.73_m2} (ref >=60)
Globulin: 2.6 g/dL (calc) (ref 1.9–3.7)
Glucose, Bld: 88 mg/dL (ref 65–99)
Potassium: 3.6 mmol/L (ref 3.5–5.3)
Sodium: 141 mmol/L (ref 135–146)
Total Bilirubin: 0.6 mg/dL (ref 0.2–1.2)
Total Protein: 6.9 g/dL (ref 6.1–8.1)

## 2018-09-25 LAB — CBC WITH DIFFERENTIAL/PLATELET
Absolute Monocytes: 260 cells/uL (ref 200–950)
Basophils Absolute: 50 cells/uL (ref 0–200)
Basophils Relative: 0.8 %
Eosinophils Absolute: 81 cells/uL (ref 15–500)
Eosinophils Relative: 1.3 %
HCT: 38.2 % (ref 35.0–45.0)
Hemoglobin: 13.2 g/dL (ref 11.7–15.5)
Lymphs Abs: 1432 cells/uL (ref 850–3900)
MCH: 30.9 pg (ref 27.0–33.0)
MCHC: 34.6 g/dL (ref 32.0–36.0)
MCV: 89.5 fL (ref 80.0–100.0)
MPV: 11 fL (ref 7.5–12.5)
Monocytes Relative: 4.2 %
Neutro Abs: 4377 cells/uL (ref 1500–7800)
Neutrophils Relative %: 70.6 %
Platelets: 286 10*3/uL (ref 140–400)
RBC: 4.27 10*6/uL (ref 3.80–5.10)
RDW: 12.8 % (ref 11.0–15.0)
Total Lymphocyte: 23.1 %
WBC: 6.2 10*3/uL (ref 3.8–10.8)

## 2018-09-25 LAB — HIV ANTIBODY (ROUTINE TESTING W REFLEX): HIV 1&2 Ab, 4th Generation: NONREACTIVE

## 2018-09-25 LAB — PROTEIN ELECTROPHORESIS, SERUM, WITH REFLEX
Albumin ELP: 4.2 g/dL (ref 3.8–4.8)
Alpha 1: 0.3 g/dL (ref 0.2–0.3)
Alpha 2: 0.6 g/dL (ref 0.5–0.9)
Beta 2: 0.4 g/dL (ref 0.2–0.5)
Beta Globulin: 0.4 g/dL (ref 0.4–0.6)
Gamma Globulin: 1.1 g/dL (ref 0.8–1.7)
Total Protein: 7 g/dL (ref 6.1–8.1)

## 2018-09-25 LAB — IGG, IGA, IGM
IgG (Immunoglobin G), Serum: 1179 mg/dL (ref 600–1640)
IgM, Serum: 105 mg/dL (ref 50–300)
Immunoglobulin A: 253 mg/dL (ref 47–310)

## 2018-09-25 LAB — SEDIMENTATION RATE: Sed Rate: 11 mm/h (ref 0–30)

## 2018-10-04 NOTE — Progress Notes (Deleted)
Office Visit Note  Patient: Frances Watkins             Date of Birth: [birth year]           MRN: [id number]             PCP: Greig Right, MD Referring: Cher Nakai, MD Visit Date: 10/17/2018 Occupation: '@GUAROCC' @  Subjective:  No chief complaint on file.   History of Present Illness: Frances Watkins is a 53 y.o. female ***   Activities of Daily Living:  Patient reports morning stiffness for *** {minute/hour:19697}.   Patient {ACTIONS;DENIES/REPORTS:21021675::"Denies"} nocturnal pain.  Difficulty dressing/grooming: {ACTIONS;DENIES/REPORTS:21021675::"Denies"} Difficulty climbing stairs: {ACTIONS;DENIES/REPORTS:21021675::"Denies"} Difficulty getting out of chair: {ACTIONS;DENIES/REPORTS:21021675::"Denies"} Difficulty using hands for taps, buttons, cutlery, and/or writing: {ACTIONS;DENIES/REPORTS:21021675::"Denies"}  No Rheumatology ROS completed.   PMFS History:  Patient Active Problem List   Diagnosis Date Noted   Psoriasis 09/21/2018   Anxiety 09/21/2018   Essential hypertension 09/21/2018   History of kidney stones 09/21/2018    Past Medical History:  Diagnosis Date   Heart murmur    no treatment   History of kidney stones    PONV (postoperative nausea and vomiting)    no problem  03/14/12   Psoriasis     Family History  Problem Relation Age of Onset   Arthritis Mother    Diabetes Father    Hypertension Father    High Cholesterol Father    Heart disease Father    Heart attack Father    Gout Father    Healthy Sister    Psoriasis Daughter        psoriatic arthritis   Healthy Daughter    Past Surgical History:  Procedure Laterality Date   COLONOSCOPY     2014, 2019   HOLMIUM LASER APPLICATION Right 6/55/3748   Procedure: HOLMIUM LASER APPLICATION;  Surgeon: Malka So, MD;  Location: [facility];  Service: Urology;  Laterality: Right;   INNER EAR SURGERY  2000   LITHOTRIPSY     x2   TONSILLECTOMY     age 31 yrs.    TYMPANOPLASTY W/ MASTOIDECTOMY     x2   UPPER GI ENDOSCOPY     2014, 2019   URETEROSCOPY  2014   Social History   Social History Narrative   Not on file    There is no immunization history on file for this patient.   Objective: Vital Signs: There were no vitals taken for this visit.   Physical Exam   Musculoskeletal Exam: ***  CDAI Exam: CDAI Score: -- Patient Global: --; Provider Global: -- Swollen: --; Tender: -- Joint Exam   No joint exam has been documented for this visit   There is currently no information documented on the homunculus. Go to the Rheumatology activity and complete the homunculus joint exam.  Investigation: No additional findings.  Imaging: Dg Chest 2 View  Result Date: 09/21/2018 CLINICAL DATA:  Immunosuppressive therapy EXAM: CHEST - 2 VIEW COMPARISON:  10/04/2017 FINDINGS: The heart size and mediastinal contours are within normal limits. Both lungs are clear. The visualized skeletal structures are unremarkable. IMPRESSION: No active cardiopulmonary disease. Electronically Signed   By: Prudencio Pair M.D.   On: 09/21/2018 16:50   Xr Foot 2 Views Left  Result Date: 09/21/2018 First MTP, PIP and DIP narrowing was noted.  No erosive changes were noted.  No intertarsal or tibiotalar joint space narrowing was noted.  Inferior calcaneal spur was noted. Impression: These findings are consistent with osteoarthritis  of the foot.  Xr Foot 2 Views Right  Result Date: 09/21/2018 First MTP, PIP and DIP narrowing was noted.  No erosive changes were noted.  No intertarsal or tibiotalar joint space narrowing was noted. Impression: These findings are consistent with osteoarthritis of the foot.  Xr Hand 2 View Left  Result Date: 09/21/2018 Mild CMC PIP and DIP narrowing was noted.  No erosive changes were noted.  No MCP, intercarpal radiocarpal or metacarpocarpal joint space narrowing was noted.  No erosive changes were noted. Impression: These findings are  consistent with osteoarthritis of the hand.  Xr Hand 2 View Right  Result Date: 09/21/2018 Minimal CMC PIP and DIP narrowing was noted.  No MCP, intercarpal or radiocarpal joint space narrowing was noted.  No erosive changes were noted. Impression: These findings are consistent with mild osteoarthritis of the hand.  Xr Pelvis 1-2 Views  Result Date: 09/21/2018 No SI joint narrowing was noted.  Right SI joint to sclerosis was noted. 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   Recent Labs: Lab Results  Component Value Date   WBC 6.2 09/21/2018   HGB 13.2 09/21/2018   PLT 286 09/21/2018   NA 141 09/21/2018   K 3.6 09/21/2018   CL 105 09/21/2018   CO2 27 09/21/2018   GLUCOSE 88 09/21/2018   BUN 9 09/21/2018   CREATININE 0.66 09/21/2018   BILITOT 0.6 09/21/2018   AST 21 09/21/2018   ALT 13 09/21/2018   PROT 6.9 09/21/2018   PROT 7.0 09/21/2018   CALCIUM 9.7 09/21/2018   GFRAA 119 09/21/2018  September 29, 2018 SPEP normal, immunoglobulins normal, HIV negative, ESR 11 Hepatitis B, hepatitis C and TB gold pending  Speciality Comments: No specialty comments available.  Procedures:  No procedures performed Allergies: Sulfa drugs cross reactors   Assessment / Plan:     Visit Diagnoses: No diagnosis found.  Orders: No orders of the defined types were placed in this encounter.  No orders of the defined types were placed in this encounter.   Face-to-face time spent with patient was *** minutes. Greater than 50% of time was spent in counseling and coordination of care.  Follow-Up Instructions: No follow-ups on file.   Bo Merino, MD  Note - This record has been created using Editor, commissioning.  Chart creation errors have been sought, but may not always  have been located. Such creation errors do not reflect on  the standard of medical care.

## 2018-10-04 NOTE — Telephone Encounter (Signed)
Received [employer] PAP application along with supporting documents of income, medical expenses and letter of financial hardship.  Application faxed today.  Will update when we receive a response.  Fax # [phone]

## 2018-10-05 NOTE — Telephone Encounter (Signed)
Received fax from [employer], application has been DENIED, due to income exceeds the Foundation's eligibility criteria.  Will send document to [place].  Phone# [phone] Fax# [phone]  Can you please call and see if there is any other information we can provide to demonstrate financial need.  We submitted medical expense and letter of financial hardship already.  Thank you.   Mariella Saa, PharmD, [place], [place] Clinical Specialty Pharmacist [phone]  10/05/2018 3:48 PM

## 2018-10-10 NOTE — Telephone Encounter (Signed)
Called Enbrel Support to see if they could assist the patient and was advised that they would need to speak to patient to discuss benefits for eligibility. Called patient and left message for her to call Enbrel Support with her insurance card to discuss eligibility. Will follow up.  Phone# [phone]  1:15 PM Beatriz Chancellor, CPhT

## 2018-10-10 NOTE — Telephone Encounter (Signed)
Patient returned call and stated that Enbrel Support told her that she could only get assistance (copay card) through them if she had a traditional prescription plan. Advised patient to try to apply for [employer] Safety Net. Patient told them she was denied due to income and they offered her no other options. Will discuss with Rph Amber and will follow up.  4:22 PM Frances Watkins, CPhT

## 2018-10-10 NOTE — Telephone Encounter (Signed)
Called [employer], and was advised that the 1st step of processing is income guidelines and if they do not fall within, they deny and do not move forward with the application. I was advised to call Enbrel Support at [phone]. Will reach out to Enbrel Support to see if they can assist that patient.  9:52 AM Beatriz Chancellor, CPhT

## 2018-10-10 NOTE — Telephone Encounter (Signed)
Left message for patient to follow up after her call to Enbrel Support.  4:02 PM Beatriz Chancellor, CPhT

## 2018-10-12 NOTE — Telephone Encounter (Signed)
Called drug representative for Enbrel to inquire about other options or if an exception could be made for patient.  He was unable to offer any soultions.  Called to discuss with patient.  Advised that next best course of action would be to apply for Humira. Patient agrees with that plan.  She will complete application and return to our office.  She has a new patient follow up appointment next week.  She asked if she needs to keep that appointment as she has not started any new medications and her symptoms haven't changed.  Will follow up with Dr. Estanislado Pandy and notify patient of response.  Mariella Saa, PharmD, [place], [place] Clinical Specialty Pharmacist [phone]  10/12/2018 1:53 PM

## 2018-10-16 ENCOUNTER — Telehealth: Payer: Self-pay | Admitting: Pharmacist

## 2018-10-16 NOTE — Telephone Encounter (Signed)
Patient is okay to postpone her new patient follow-up appointment.  We may schedule the new patient follow-up appointment when she receives Humira.

## 2018-10-16 NOTE — Telephone Encounter (Signed)
Patient notified and appointment canceled.

## 2018-10-17 ENCOUNTER — Ambulatory Visit: Payer: PRIVATE HEALTH INSURANCE | Admitting: Rheumatology

## 2018-10-24 ENCOUNTER — Telehealth: Payer: Self-pay | Admitting: Pharmacist

## 2018-10-24 NOTE — Telephone Encounter (Signed)
Patient was denied patient assistance for Enbrel.  Received application to apply for Humira patient assistance.  Application faxed and will update when we receive a response.  Will send document to the scan center.

## 2018-11-06 NOTE — Telephone Encounter (Signed)
Called [employer] to check on patient's application. Per rep Luvenia Starch, the application is being reviewed by a supervisor due to patient's insurance situation/income. Will receive a fax to the office after determination.  Phone# [phone]  12:09 PM Beatriz Chancellor, CPhT

## 2018-11-20 NOTE — Telephone Encounter (Signed)
Called [employer] Assist to follow up on application. Rep Ander Purpura advised it is still in review. Determination should be made by end of this week. Will follow up.  Phone# [phone]  11:11 AM Beatriz Chancellor, CPhT

## 2018-11-22 NOTE — Telephone Encounter (Signed)
Please call patient to schedule an appointment to discuss treatment options.

## 2018-11-22 NOTE — Progress Notes (Signed)
Office Visit Note  Patient: Frances Watkins             Date of Birth: [birth year]           MRN: [id number]             PCP: Alinda DeemPenner, Pamela, MD Referring: Alinda DeemPenner, Pamela, MD Visit Date: 11/28/2018 Occupation: @GUAROCC @  Subjective:  Discuss medication options   History of Present Illness: Frances Watkins is a 53 y.o. female with history of psoriatic arthritis. She presents today to discuss treatment options.   She continues to have pain in multiple joints including both hands, both knee joints, both ankle joints, and bilateral SI joints.  She has ongoing plantar fasciitis and achilles tendonitis bilaterally.  She has psoriasis on her scalp, neck, elbows, and both knees.  She has been using topical agents, which have not been effective recently.      Activities of Daily Living:  Patient reports morning stiffness for 1 hour.   Patient Reports nocturnal pain.  Difficulty dressing/grooming: Denies Difficulty climbing stairs: Reports Difficulty getting out of chair: Reports Difficulty using hands for taps, buttons, cutlery, and/or writing: Denies  Review of Systems  Constitutional: Positive for fatigue.  HENT: Negative for mouth sores, mouth dryness and nose dryness.   Eyes: Positive for dryness. Negative for pain and visual disturbance.  Respiratory: Negative for cough, hemoptysis, shortness of breath and difficulty breathing.   Cardiovascular: Negative for chest pain, palpitations, hypertension and swelling in legs/feet.  Gastrointestinal: Negative for blood in stool, constipation and diarrhea.  Endocrine: Negative for increased urination.  Genitourinary: Negative for difficulty urinating and painful urination.  Musculoskeletal: Positive for arthralgias, joint pain, joint swelling and morning stiffness. Negative for myalgias, muscle weakness, muscle tenderness and myalgias.  Skin: Positive for rash. Negative for color change, pallor, nodules/bumps, skin tightness, ulcers and  sensitivity to sunlight.  Allergic/Immunologic: Negative for susceptible to infections.  Neurological: Negative for numbness, headaches, memory loss and weakness.  Hematological: Negative for bruising/bleeding tendency and swollen glands.  Psychiatric/Behavioral: Positive for sleep disturbance. Negative for depressed mood and confusion. The patient is not nervous/anxious.     PMFS History:  Patient Active Problem List   Diagnosis Date Noted   Psoriasis 09/21/2018   Anxiety 09/21/2018   Essential hypertension 09/21/2018   History of kidney stones 09/21/2018    Past Medical History:  Diagnosis Date   Heart murmur    no treatment   History of kidney stones    PONV (postoperative nausea and vomiting)    no problem  03/14/12   Psoriasis     Family History  Problem Relation Age of Onset   Arthritis Mother    Diabetes Father    Hypertension Father    High Cholesterol Father    Heart disease Father    Heart attack Father    Gout Father    Healthy Sister    Psoriasis Daughter        psoriatic arthritis   Healthy Daughter    Past Surgical History:  Procedure Laterality Date   COLONOSCOPY     2014, 2019   HOLMIUM LASER APPLICATION Right 03/23/2012   Procedure: HOLMIUM LASER APPLICATION;  Surgeon: Anner CreteJohn J Wrenn, MD;  [facility];  Service: Urology;  Laterality: Right;   INNER EAR SURGERY  2000   LITHOTRIPSY     x2   TONSILLECTOMY     age 59 yrs.   TYMPANOPLASTY W/ MASTOIDECTOMY     x2  UPPER GI ENDOSCOPY     2014, 2019   URETEROSCOPY  2014   Social History   Social History Narrative   Not on file    There is no immunization history on file for this patient.   Objective: Vital Signs: BP (!) 153/86 (BP Location: Left Arm, Patient Position: Sitting, Cuff Size: Normal)    Pulse 60    Resp 13    Ht 5\' 3"  (1.6 m)    Wt 183 lb 12.8 oz (83.4 kg)    BMI 32.56 kg/m    Physical Exam Vitals signs and nursing note reviewed.    Constitutional:      Appearance: She is well-developed.  HENT:     Head: Normocephalic and atraumatic.  Eyes:     Conjunctiva/sclera: Conjunctivae normal.  Neck:     Musculoskeletal: Normal range of motion.  Cardiovascular:     Rate and Rhythm: Normal rate and regular rhythm.     Heart sounds: Normal heart sounds.  Pulmonary:     Effort: Pulmonary effort is normal.     Breath sounds: Normal breath sounds.  Abdominal:     General: Bowel sounds are normal.     Palpations: Abdomen is soft.  Lymphadenopathy:     Cervical: No cervical adenopathy.  Skin:    General: Skin is warm and dry.     Capillary Refill: Capillary refill takes less than 2 seconds.     Comments: Patches of psoriasis noted on the forehead, scalp, back of her neck, extensor surface of both elbow joints, and extensor surfaces of both knee joints.  Neurological:     Mental Status: She is alert and oriented to person, place, and time.  Psychiatric:        Behavior: Behavior normal.      Musculoskeletal Exam: C-spine, thoracic spine, lumbar spine good range of motion.  No midline spinal tenderness.  She has tenderness over bilateral SI joints.  Shoulder joints have good range of motion with some discomfort bilaterally.  Elbow joints have good range of motion with no tenderness or inflammation.  Wrist joints have good range of motion with no tenderness or synovitis.  MCPs, PIPs, DIPs good range of motion no synovitis.  She has tenderness of the right third PIP joint.  She has mild PIP and DIP synovial thickening.  Hip joints have good range of motion with no discomfort.  Knee joints have good range of motion with no warmth or effusion.  She has bilateral Achilles tendinitis worse on the left.  Bilateral plantar fasciitis.  CDAI Exam: CDAI Score: -- Patient Global: --; Provider Global: -- Swollen: --; Tender: -- Joint Exam   No joint exam has been documented for this visit   There is currently no information  documented on the homunculus. Go to the Rheumatology activity and complete the homunculus joint exam.  Investigation: No additional findings.  Imaging: No results found.  Recent Labs: Lab Results  Component Value Date   WBC 6.2 09/21/2018   HGB 13.2 09/21/2018   PLT 286 09/21/2018   NA 141 09/21/2018   K 3.6 09/21/2018   CL 105 09/21/2018   CO2 27 09/21/2018   GLUCOSE 88 09/21/2018   BUN 9 09/21/2018   CREATININE 0.66 09/21/2018   BILITOT 0.6 09/21/2018   AST 21 09/21/2018   ALT 13 09/21/2018   PROT 6.9 09/21/2018   PROT 7.0 09/21/2018   CALCIUM 9.7 09/21/2018   GFRAA 119 09/21/2018    Speciality Comments: No specialty  comments available.  Procedures:  No procedures performed Allergies: Sulfa drugs cross reactors   Assessment / Plan:     Visit Diagnoses: Psoriatic arthritis (HCC): She has no synovitis or dactylitis on exam. She presents today with ongoing pain in multiple joints including bilateral hands, bilateral knee joints, and bilateral ankle joints.  She has tenderness over bilateral SI joints.  She has ongoing plantar fasciitis and bilateral achilles tendonitis.  She has extensive psoriasis on her scalp, face, bilateral upper and lower extremities.  She presents today to discuss medication options. She does not quality for patient assistance for biologic agents.  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  We discussed starting her on Methotrexate 0.6 ml once weekly for 2 weeks and if labs are stable at that time she will increase to MTX 0.8 ml once weekly.  She will take folic acid 2 mg po daily.  Indications, contraindications, and potential side effects of MTX were discussed.  All questions were addressed and consent was obtained today.  She is aware that methotrexate not be an effective medication and she may require combination therapy in the future.  She will follow up in 4 weeks.    Psoriasis: She has patches of psoriasis on  her forehead, scalp, back of her neck, extensor surface of both elbow joints, and bilateral lower extremities.  She has been using topical triamcinolone cream and clobetasol ointment as needed.  She reports she continues to have nail pitting but she currently has her nails painted so I was unable to exam her fingernails.  High risk medication use - She will be starting on injectable MTX 0.6 ml once weekly and if labs are stable she will increase to 0.8 ml once weekly.  She will return for lab work in 2 weeks x2, 2 months, then every 3 months. She does not qualify for pt assistance for biologics.    Chronic SI joint pain: She has tenderness of bilateral SI joints.  Plantar fasciitis, bilateral: She continues to have tenderness over bilateral plantar fasciitis.  She experiences severe pain first thing in the morning.  Achilles tendinitis, left leg: She has tenderness of the left Achilles tendon on exam.  Other medical conditions are listed as follows:  Essential hypertension  History of kidney stones  Solar elastosis  Orders: No orders of the defined types were placed in this encounter.  No orders of the defined types were placed in this encounter.   Face-to-face time spent with patient was 30 minutes. Greater than 50% of time was spent in counseling and coordination of care.  Follow-Up Instructions: Return in about 4 weeks (around 12/26/2018) for Psoriatic arthritis.   Gearldine Bienenstock, PA-C  Note - This record has been created using Dragon software.  Chart creation errors have been sought, but may not always  have been located. Such creation errors do not reflect on  the standard of medical care.

## 2018-11-22 NOTE — Telephone Encounter (Signed)
Patient schedule for 11/28/18 at 9:40 am.

## 2018-11-22 NOTE — Telephone Encounter (Signed)
Patient denied Humira patient assistance due to income.  She will not be eligible for any patient assistance due to her income and unique insurance situation.   She has tried MTX in the past but d/c due to side effects.  She was on Humira for 3 months but discontinued due to side effects.    She is currently not on treatment as she was hoping she would qualify for patient assistance.  Alternative treatment options to consider include: another trial of oral methotrexate, injectable methotrexate to reduce side effects, Arava, and sulfasalazine.  These are all generic medications and should be affordable.  Please advise. Patient will need appointment to discuss alternative treatment options.   Mariella Saa, PharmD, [place], [place] Clinical Specialty Pharmacist [phone]  11/22/2018 11:48 AM

## 2018-11-28 ENCOUNTER — Encounter: Payer: Self-pay | Admitting: Physician Assistant

## 2018-11-28 ENCOUNTER — Other Ambulatory Visit: Payer: Self-pay

## 2018-11-28 ENCOUNTER — Ambulatory Visit (INDEPENDENT_AMBULATORY_CARE_PROVIDER_SITE_OTHER): Payer: No Typology Code available for payment source | Admitting: Physician Assistant

## 2018-11-28 VITALS — BP 153/86 | HR 60 | Resp 13 | Ht 63.0 in | Wt 183.8 lb

## 2018-11-28 DIAGNOSIS — Z79899 Other long term (current) drug therapy: Secondary | ICD-10-CM

## 2018-11-28 DIAGNOSIS — L578 Other skin changes due to chronic exposure to nonionizing radiation: Secondary | ICD-10-CM

## 2018-11-28 DIAGNOSIS — G8929 Other chronic pain: Secondary | ICD-10-CM

## 2018-11-28 DIAGNOSIS — L4050 Arthropathic psoriasis, unspecified: Secondary | ICD-10-CM

## 2018-11-28 DIAGNOSIS — M533 Sacrococcygeal disorders, not elsewhere classified: Secondary | ICD-10-CM

## 2018-11-28 DIAGNOSIS — M722 Plantar fascial fibromatosis: Secondary | ICD-10-CM

## 2018-11-28 DIAGNOSIS — L409 Psoriasis, unspecified: Secondary | ICD-10-CM | POA: Diagnosis not present

## 2018-11-28 DIAGNOSIS — Z87442 Personal history of urinary calculi: Secondary | ICD-10-CM

## 2018-11-28 DIAGNOSIS — I10 Essential (primary) hypertension: Secondary | ICD-10-CM

## 2018-11-28 DIAGNOSIS — M7662 Achilles tendinitis, left leg: Secondary | ICD-10-CM

## 2018-11-28 MED ORDER — "TUBERCULIN SYRINGE 27G X 1/2"" 1 ML MISC": 12.0000 | 3 refills | Status: DC

## 2018-11-28 MED ORDER — METHOTREXATE SODIUM CHEMO INJECTION 50 MG/2ML: INTRAMUSCULAR | 0 refills | Status: DC

## 2018-11-28 MED ORDER — FOLIC ACID 1 MG PO TABS: 2.0000 mg | ORAL_TABLET | Freq: Every day | ORAL | 3 refills | Status: DC

## 2018-11-28 NOTE — Progress Notes (Signed)
Pharmacy Note  Subjective: Patient presents today to the [facility] to see Dr. Corliss Skains.  Patient seen by the pharmacist for counseling on methotrexate for psoriatic arthritis.   She has been on Humira and methotrexate in the past.  We applied for Enbrel and Humira patient assistance and she was denied due to income.  Objective: CBC    Component Value Date/Time   WBC 6.2 09/21/2018 1143   RBC 4.27 09/21/2018 1143   HGB 13.2 09/21/2018 1143   HCT 38.2 09/21/2018 1143   PLT 286 09/21/2018 1143   MCV 89.5 09/21/2018 1143   MCH 30.9 09/21/2018 1143   MCHC 34.6 09/21/2018 1143   RDW 12.8 09/21/2018 1143   LYMPHSABS 1,432 09/21/2018 1143   EOSABS 81 09/21/2018 1143   BASOSABS 50 09/21/2018 1143    CMP     Component Value Date/Time   NA 141 09/21/2018 1143   K 3.6 09/21/2018 1143   CL 105 09/21/2018 1143   CO2 27 09/21/2018 1143   GLUCOSE 88 09/21/2018 1143   BUN 9 09/21/2018 1143   CREATININE 0.66 09/21/2018 1143   CALCIUM 9.7 09/21/2018 1143   PROT 6.9 09/21/2018 1143   PROT 7.0 09/21/2018 1143   AST 21 09/21/2018 1143   ALT 13 09/21/2018 1143   BILITOT 0.6 09/21/2018 1143   GFRNONAA 102 09/21/2018 1143   GFRAA 119 09/21/2018 1143    Baseline Immunosuppressant Therapy Labs  TB GOLD: negative 07/17/2018 per dermatology notes  Hepatitis Panel:negative 07/17/2018 per dermatology notes  HIV Lab Results  Component Value Date   HIV NON-REACTIVE 09/21/2018   Immunoglobulins Immunoglobulin Electrophoresis Latest Ref Rng & Units 09/21/2018  IgA  47 - 310 mg/dL 440  IgG 347 - 4,259 mg/dL 5,638  IgM 50 - 756 mg/dL 433   SPEP Serum Protein Electrophoresis Latest Ref Rng & Units 09/21/2018  Total Protein 6.1 - 8.1 g/dL 7.0  Albumin 3.8 - 4.8 g/dL 4.2  Alpha-1 0.2 - 0.3 g/dL 0.3  Alpha-2 0.5 - 0.9 g/dL 0.6  Beta Globulin 0.4 - 0.6 g/dL 0.4  Beta 2 0.2 - 0.5 g/dL 0.4  Gamma Globulin 0.8 - 1.7 g/dL 1.1   I9JJ No results found for: G6PDH TPMT No results  found for: TPMT   Chest-xray:  No active cardiopulmonary disease 10/04/2017  Contraception: perimenopausal and has IUD till 2022  Alcohol use: on occasion.  Reviewed   Assessment/Plan:   Patient was counseled on the purpose, proper use, and adverse effects of methotrexate including nausea, infection, and signs and symptoms of pneumonitis. Discussed that there is the possibility of an increased risk of malignancy, specifically lymphomas, but it is not well understood if this increased risk is due to the medication or the disease state.  Instructed patient that medication should be held for infection and prior to surgery.  Advised patient to avoid live vaccines. Recommend annual influenza, Pneumovax 23, Prevnar 13, and Shingrix as indicated.   Reviewed instructions with patient to take methotrexate weekly along with folic acid daily.  Discussed the importance of frequent monitoring of kidney and liver function and blood counts, and provided patient with standing lab instructions. She prefers to have labs drawn at [facility] in [place].  Standing orders placed. Counseled patient to avoid NSAIDs and alcohol while on methotrexate.  Provided patient with educational materials on methotrexate and answered all questions.   Patient voiced understanding.  Patient consented to methotrexate use.  Will upload into chart.    Dose of methotrexate will be  0.6 ml every 7 days for 2 weeks then increase to 0.8 ml every 7 days along with folic acid 2 mg daily. Prescription sent to [facility] in [place] per patient request.  Educated patient on how to use a vial and syringe and reviewed injection technique with patient.  Patient was able to demonstrate proper technique for injections using vial and syringe.  Provided patient educational material regarding injection technique and storage of methotrexate.    Patient needs to be on more aggressive therapy like a biologic but unable to afford and does not  qualify for patient assistance. Recommend patient look for new insurance plan with coverage for biologics.  Advised that even if it is a high deductible plan, would still be beneficial as she could use a co-pay card to reduce the cost as most apply $12-15,000 per year.  Patient verbalized understanding.  All questions encouraged and answered.  Instructed patient to call with any questions or concerns.  Mariella Saa, PharmD, [place], [place] Clinical Specialty Pharmacist [phone]  11/28/2018 10:58 AM

## 2018-11-28 NOTE — Patient Instructions (Signed)
Standing Labs We placed an order today for your standing lab work.    Please come back and get your standing labs in 2 weeks, 4 weeks, 8 weeks, then every 3 months.  We have open lab daily Monday through Thursday from 8:30-12:30 PM and 1:30-4:30 PM and Friday from 8:30-12:30 PM and 1:30-4:00 PM at the office of Dr. Bo Merino.   You may experience shorter wait times on Monday and Friday afternoons. The office is located at [address], [place], [place], [place] [address] No appointment is necessary.   Labs are drawn by [facility].  You may receive a bill from [facility] for your lab work.  If you wish to have your labs drawn at another location, please call the office 24 hours in advance to send orders.  If you have any questions regarding directions or hours of operation,  please call [phone].   Just as a reminder please drink plenty of water prior to coming for your lab work. Thanks!  Vaccines You are taking a medication(s) that can suppress your immune system.  The following immunizations are recommended: . Flu annually . Pneumonia (Pneumovax 23 and Prevnar 13 spaced at least 1 year apart) . Shingrix  Please check with your PCP to make sure you are up to date.

## 2018-12-18 ENCOUNTER — Other Ambulatory Visit: Payer: Self-pay | Admitting: *Deleted

## 2018-12-18 ENCOUNTER — Telehealth: Payer: Self-pay | Admitting: Rheumatology

## 2018-12-18 DIAGNOSIS — Z79899 Other long term (current) drug therapy: Secondary | ICD-10-CM

## 2018-12-18 NOTE — Telephone Encounter (Signed)
Patient called requesting her labwork orders be sent to [facility] in [place].  Patient states she plans to go tomorrow morning.

## 2018-12-18 NOTE — Telephone Encounter (Signed)
Lab Orders released.  

## 2018-12-20 NOTE — Progress Notes (Deleted)
Office Visit Note  Patient: Frances Watkins             Date of Birth: 02-23-1965           MRN: [id number]             PCP: Greig Right, MD Referring: Greig Right, MD Visit Date: 01/03/2019 Occupation: @GUAROCC @  Subjective:  No chief complaint on file.   History of Present Illness: Frances Watkins is a 53 y.o. female ***   Activities of Daily Living:  Patient reports morning stiffness for *** {minute/hour:19697}.   Patient {ACTIONS;DENIES/REPORTS:21021675::"Denies"} nocturnal pain.  Difficulty dressing/grooming: {ACTIONS;DENIES/REPORTS:21021675::"Denies"} Difficulty climbing stairs: {ACTIONS;DENIES/REPORTS:21021675::"Denies"} Difficulty getting out of chair: {ACTIONS;DENIES/REPORTS:21021675::"Denies"} Difficulty using hands for taps, buttons, cutlery, and/or writing: {ACTIONS;DENIES/REPORTS:21021675::"Denies"}  No Rheumatology ROS completed.   PMFS History:  Patient Active Problem List   Diagnosis Date Noted  . Psoriasis 09/21/2018  . Anxiety 09/21/2018  . Essential hypertension 09/21/2018  . History of kidney stones 09/21/2018    Past Medical History:  Diagnosis Date  . Heart murmur    no treatment  . History of kidney stones   . PONV (postoperative nausea and vomiting)    no problem  03/14/12  . Psoriasis     Family History  Problem Relation Age of Onset  . Arthritis Mother   . Diabetes Father   . Hypertension Father   . High Cholesterol Father   . Heart disease Father   . Heart attack Father   . Gout Father   . Healthy Sister   . Psoriasis Daughter        psoriatic arthritis  . Healthy Daughter    Past Surgical History:  Procedure Laterality Date  . COLONOSCOPY     2014, 2019  . HOLMIUM LASER APPLICATION Right 09/25/2351   Procedure: HOLMIUM LASER APPLICATION;  Surgeon: Malka So, MD;  Location: [facility];  Service: Urology;  Laterality: Right;  . INNER EAR SURGERY  2000  . LITHOTRIPSY     x2  . TONSILLECTOMY     age 25 yrs.   . TYMPANOPLASTY W/ MASTOIDECTOMY     x2  . UPPER GI ENDOSCOPY     2014, 2019  . URETEROSCOPY  2014   Social History   Social History Narrative  . Not on file   Immunization History  Administered Date(s) Administered  . Pneumococcal Conjugate-13 09/27/2018     Objective: Vital Signs: There were no vitals taken for this visit.   Physical Exam   Musculoskeletal Exam: ***  CDAI Exam: CDAI Score: - Patient Global: -; Provider Global: - Swollen: -; Tender: - Joint Exam   No joint exam has been documented for this visit   There is currently no information documented on the homunculus. Go to the Rheumatology activity and complete the homunculus joint exam.  Investigation: No additional findings.  Imaging: No results found.  Recent Labs: Lab Results  Component Value Date   WBC 5.6 12/19/2018   HGB 13.1 12/19/2018   PLT 270 12/19/2018   NA WILL FOLLOW 12/19/2018   K WILL FOLLOW 12/19/2018   CL WILL FOLLOW 12/19/2018   CO2 WILL FOLLOW 12/19/2018   GLUCOSE WILL FOLLOW 12/19/2018   BUN WILL FOLLOW 12/19/2018   CREATININE WILL FOLLOW 12/19/2018   BILITOT WILL FOLLOW 12/19/2018   ALKPHOS WILL FOLLOW 12/19/2018   AST WILL FOLLOW 12/19/2018   ALT WILL FOLLOW 12/19/2018   PROT WILL FOLLOW 12/19/2018   ALBUMIN WILL FOLLOW 12/19/2018  CALCIUM WILL FOLLOW 12/19/2018   GFRAA WILL FOLLOW 12/19/2018    Speciality Comments: No specialty comments available.  Procedures:  No procedures performed Allergies: Sulfa drugs cross reactors   Assessment / Plan:     Visit Diagnoses: No diagnosis found.  Orders: No orders of the defined types were placed in this encounter.  No orders of the defined types were placed in this encounter.   Face-to-face time spent with patient was *** minutes. Greater than 50% of time was spent in counseling and coordination of care.  Follow-Up Instructions: No follow-ups on file.   Ellen Henri, CMA  Note - This record has been  created using Animal nutritionist.  Chart creation errors have been sought, but may not always  have been located. Such creation errors do not reflect on  the standard of medical care.

## 2019-01-01 NOTE — Progress Notes (Signed)
Virtual Visit via Video Note  I connected with Frances Watkins on 01/02/19 at  2:00 PM EST by a video enabled telemedicine application and verified that I am speaking with the correct person using two identifiers.  Location: Patient: Home  Provider: Clinic  This service was conducted via virtual visit.  Both audio and visual tools were used.  The patient was located at home. I was located in my office.  Consent was obtained prior to the virtual visit and is aware of possible charges through their insurance for this visit.  The patient is an established patient.  Dr. Estanislado Pandy, MD conducted the virtual visit and Hazel Sams, PA-C acted as scribe during the service.  Office staff helped with scheduling follow up visits after the service was conducted.   I discussed the limitations of evaluation and management by telemedicine and the availability of in person appointments. The patient expressed understanding and agreed to proceed.  CC: Achilles tendonitis bilaterally  History of Present Illness: Patient is a 53 year old female with a past medical history of psoriatic arthritis. y.  She started on MTX 0.6 ml on 12/03/18.  She has increased MTX to 0.8 ml sq once weekly.  She is due for her 5th injection this week. She cannot be on more aggressive treatment due to not having good health insurance. She will be trying to get new health insurance. She has noticed very mild improvement since starting on MTX. She has persistent achilles tendonitis and plantar fasciitis bilaterally.  She has pain in both hands and both feet.  She has intermittent swelling in the right middle finger. She has bilateral SI joint pain.      Review of Systems  Constitutional: Negative for fever and malaise/fatigue.  Eyes: Negative for photophobia, pain, discharge and redness.  Respiratory: Negative for cough, shortness of breath and wheezing.   Cardiovascular: Negative for chest pain and palpitations.  Gastrointestinal: Negative for  blood in stool, constipation and diarrhea.  Genitourinary: Negative for dysuria.  Musculoskeletal: Positive for back pain (SI joint pain bilaterally ) and joint pain. Negative for myalgias and neck pain.       +Morning stiffness +Joint swelling   Skin: Negative for rash.  Neurological: Negative for dizziness and headaches.  Psychiatric/Behavioral: Negative for depression. The patient is not nervous/anxious and does not have insomnia.       Observations/Objective: Physical Exam  Constitutional: She is oriented to person, place, and time and well-developed, well-nourished, and in no distress.  HENT:  Head: Normocephalic and atraumatic.  Eyes: Conjunctivae are normal.  Pulmonary/Chest: Effort normal.  Neurological: She is alert and oriented to person, place, and time.  Psychiatric: Mood, memory, affect and judgment normal.   Patient reports morning stiffness for 90   minutes.   Patient reports nocturnal pain.  Difficulty dressing/grooming: Denies Difficulty climbing stairs: Denies Difficulty getting out of chair: Denies Difficulty using hands for taps, buttons, cutlery, and/or writing: Reports   Assessment and Plan: Visit Diagnoses: Psoriatic arthritis ([place]): She has persistent achilles tendonitis and plantar fasciitis bilaterally.  She has intermittent pain in bilateral hands and bilateral feet. She has intermittent inflammation in the right 3rd PIP joint. She has chronic discomfort in both SI joints.  She was started on MTX 0.8 ml sq once weekly and folic acid 2 mg po daily at the beginning of November 2020.  She is tolerating MTX without any side effects. She has noticed minimal improvement with MTX.  She has noticed gradual improvement in  her psoriasis.   Ideally she should be on more aggressive treatment but her health insurance will not cover any biologics. She is looking into switching insurances for the new year.  We may need to add low dose arava 10 mg po daily if her symptoms do  not improve in the meantime.  We will give MTX more time and reassess in 6-8 weeks.    Psoriasis: She has patches of psoriasis on her forehead, scalp, back of her neck, extensor surface of both elbow joints, and bilateral lower extremities.  She has been using topical triamcinolone cream and clobetasol ointment as needed.  she has noticed some improvement in her psoriasis since starting on MTX.  She states she has noticed about 20% improvement in her psoriasis.   High risk medication use - MTX 0.8 ml sq once weekly and folic acid 2 mg po daily (started in November 2020). She does not qualify for pt assistance for biologics.  She is looking into signing up for new health insurance.   Chronic SI joint pain: She has discomfort in bilateral SI joints.  Plantar fasciitis, bilateral: She has persistent plantar fasciitis bilaterally.   Achilles tendinitis, left leg: She has persistent tenderness and inflammation.  She experiences nocturnal pain at times.  She is unable to add on a biologic at this time.    Other medical conditions are listed as follows:  Essential hypertension  History of kidney stones  Solar elastosis Follow Up Instructions: She will follow up in 6-8 weeks.    I discussed the assessment and treatment plan with the patient. The patient was provided an opportunity to ask questions and all were answered. The patient agreed with the plan and demonstrated an understanding of the instructions.   The patient was advised to call back or seek an in-person evaluation if the symptoms worsen or if the condition fails to improve as anticipated.  I provided 25 minutes of non-face-to-face time during this encounter.   Pollyann Savoy, MD   Scribed by-  Sherron Ales, PA-C

## 2019-01-02 ENCOUNTER — Encounter: Payer: Self-pay | Admitting: Rheumatology

## 2019-01-02 ENCOUNTER — Telehealth (INDEPENDENT_AMBULATORY_CARE_PROVIDER_SITE_OTHER): Payer: No Typology Code available for payment source | Admitting: Rheumatology

## 2019-01-02 ENCOUNTER — Other Ambulatory Visit: Payer: Self-pay

## 2019-01-02 DIAGNOSIS — L4050 Arthropathic psoriasis, unspecified: Secondary | ICD-10-CM | POA: Diagnosis not present

## 2019-01-02 DIAGNOSIS — M533 Sacrococcygeal disorders, not elsewhere classified: Secondary | ICD-10-CM

## 2019-01-02 DIAGNOSIS — M722 Plantar fascial fibromatosis: Secondary | ICD-10-CM

## 2019-01-02 DIAGNOSIS — G8929 Other chronic pain: Secondary | ICD-10-CM

## 2019-01-02 DIAGNOSIS — Z79899 Other long term (current) drug therapy: Secondary | ICD-10-CM | POA: Diagnosis not present

## 2019-01-02 DIAGNOSIS — L409 Psoriasis, unspecified: Secondary | ICD-10-CM | POA: Diagnosis not present

## 2019-01-02 DIAGNOSIS — Z87442 Personal history of urinary calculi: Secondary | ICD-10-CM

## 2019-01-02 DIAGNOSIS — I10 Essential (primary) hypertension: Secondary | ICD-10-CM

## 2019-01-02 DIAGNOSIS — L578 Other skin changes due to chronic exposure to nonionizing radiation: Secondary | ICD-10-CM

## 2019-01-02 DIAGNOSIS — M7662 Achilles tendinitis, left leg: Secondary | ICD-10-CM

## 2019-01-02 DIAGNOSIS — F419 Anxiety disorder, unspecified: Secondary | ICD-10-CM

## 2019-01-03 ENCOUNTER — Telehealth: Payer: No Typology Code available for payment source | Admitting: Rheumatology

## 2019-01-03 ENCOUNTER — Other Ambulatory Visit: Payer: Self-pay

## 2019-01-03 DIAGNOSIS — Z79899 Other long term (current) drug therapy: Secondary | ICD-10-CM

## 2019-01-04 LAB — CBC WITH DIFFERENTIAL/PLATELET
Basophils Absolute: 0.1 10*3/uL (ref 0.0–0.2)
Basos: 1 %
EOS (ABSOLUTE): 0.2 10*3/uL (ref 0.0–0.4)
Eos: 3 %
Hematocrit: 37.7 % (ref 34.0–46.6)
Hemoglobin: 12.9 g/dL (ref 11.1–15.9)
Immature Grans (Abs): 0 10*3/uL (ref 0.0–0.1)
Immature Granulocytes: 0 %
Lymphocytes Absolute: 1.9 10*3/uL (ref 0.7–3.1)
Lymphs: 24 %
MCH: 30.9 pg (ref 26.6–33.0)
MCHC: 34.2 g/dL (ref 31.5–35.7)
MCV: 90 fL (ref 79–97)
Monocytes Absolute: 0.5 10*3/uL (ref 0.1–0.9)
Monocytes: 6 %
Neutrophils Absolute: 5.1 10*3/uL (ref 1.4–7.0)
Neutrophils: 66 %
Platelets: 306 10*3/uL (ref 150–450)
RBC: 4.17 x10E6/uL (ref 3.77–5.28)
RDW: 13.7 % (ref 11.7–15.4)
WBC: 7.7 10*3/uL (ref 3.4–10.8)

## 2019-01-04 LAB — CMP14+EGFR
ALT: 26 IU/L (ref 0–32)
AST: 28 IU/L (ref 0–40)
Albumin/Globulin Ratio: 1.8 (ref 1.2–2.2)
Albumin: 4.7 g/dL (ref 3.8–4.9)
Alkaline Phosphatase: 74 IU/L (ref 39–117)
BUN/Creatinine Ratio: 18 (ref 9–23)
BUN: 11 mg/dL (ref 6–24)
Bilirubin Total: 0.4 mg/dL (ref 0.0–1.2)
CO2: 28 mmol/L (ref 20–29)
Calcium: 8.9 mg/dL (ref 8.7–10.2)
Chloride: 104 mmol/L (ref 96–106)
Creatinine, Ser: 0.62 mg/dL (ref 0.57–1.00)
GFR calc Af Amer: 121 mL/min/{1.73_m2} (ref >59)
GFR calc non Af Amer: 105 mL/min/{1.73_m2} (ref >59)
Globulin, Total: 2.6 g/dL (ref 1.5–4.5)
Glucose: 101 mg/dL — ABNORMAL HIGH (ref 65–99)
Potassium: 4.2 mmol/L (ref 3.5–5.2)
Sodium: 141 mmol/L (ref 134–144)
Total Protein: 7.3 g/dL (ref 6.0–8.5)

## 2019-01-04 NOTE — Progress Notes (Signed)
WNLs

## 2019-01-05 LAB — CMP14+EGFR

## 2019-01-05 LAB — CBC WITH DIFFERENTIAL/PLATELET
Basophils Absolute: 0.1 10*3/uL (ref 0.0–0.2)
Basos: 1 %
EOS (ABSOLUTE): 0.2 10*3/uL (ref 0.0–0.4)
Eos: 3 %
Hematocrit: 38.3 % (ref 34.0–46.6)
Hemoglobin: 13.1 g/dL (ref 11.1–15.9)
Immature Grans (Abs): 0 10*3/uL (ref 0.0–0.1)
Immature Granulocytes: 0 %
Lymphocytes Absolute: 1.4 10*3/uL (ref 0.7–3.1)
Lymphs: 26 %
MCH: 30.5 pg (ref 26.6–33.0)
MCHC: 34.2 g/dL (ref 31.5–35.7)
MCV: 89 fL (ref 79–97)
Monocytes Absolute: 0.3 10*3/uL (ref 0.1–0.9)
Monocytes: 5 %
Neutrophils Absolute: 3.6 10*3/uL (ref 1.4–7.0)
Neutrophils: 65 %
Platelets: 270 10*3/uL (ref 150–450)
RBC: 4.3 x10E6/uL (ref 3.77–5.28)
RDW: 13.1 % (ref 11.7–15.4)
WBC: 5.6 10*3/uL (ref 3.4–10.8)

## 2019-01-05 NOTE — Progress Notes (Signed)
CMP is available now.  It was also within normal limits.

## 2019-01-17 ENCOUNTER — Telehealth: Payer: Self-pay | Admitting: Rheumatology

## 2019-01-17 DIAGNOSIS — L4050 Arthropathic psoriasis, unspecified: Secondary | ICD-10-CM

## 2019-01-17 MED ORDER — METHOTREXATE SODIUM CHEMO INJECTION 50 MG/2ML: INTRAMUSCULAR | 0 refills | Status: DC

## 2019-01-17 NOTE — Telephone Encounter (Signed)
Last Visit: 01/02/2019 telemedicine  Next Visit: 03/01/2019 Labs: 01/03/2019 WNLs  Okay to refill per Dr. Estanislado Pandy.

## 2019-01-17 NOTE — Telephone Encounter (Signed)
Patient request a refill on MTX sent to [facility] in [place].

## 2019-01-29 ENCOUNTER — Telehealth: Payer: Self-pay | Admitting: Rheumatology

## 2019-01-29 ENCOUNTER — Other Ambulatory Visit: Payer: Self-pay

## 2019-01-29 DIAGNOSIS — Z79899 Other long term (current) drug therapy: Secondary | ICD-10-CM

## 2019-01-29 NOTE — Telephone Encounter (Signed)
Lab orders released for [facility].  

## 2019-01-29 NOTE — Telephone Encounter (Signed)
Patient called requesting labwork orders be sent to [facility] in [place].  Patient states she will be going tomorrow morning 01/30/19.

## 2019-01-31 LAB — CMP14+EGFR
ALT: 22 IU/L (ref 0–32)
AST: 20 IU/L (ref 0–40)
Albumin/Globulin Ratio: 1.9 (ref 1.2–2.2)
Albumin: 4.5 g/dL (ref 3.8–4.9)
Alkaline Phosphatase: 73 IU/L (ref 39–117)
BUN/Creatinine Ratio: 18 (ref 9–23)
BUN: 13 mg/dL (ref 6–24)
Bilirubin Total: 0.5 mg/dL (ref 0.0–1.2)
CO2: 26 mmol/L (ref 20–29)
Calcium: 9.2 mg/dL (ref 8.7–10.2)
Chloride: 103 mmol/L (ref 96–106)
Creatinine, Ser: 0.71 mg/dL (ref 0.57–1.00)
GFR calc Af Amer: 114 mL/min/{1.73_m2} (ref >59)
GFR calc non Af Amer: 99 mL/min/{1.73_m2} (ref >59)
Globulin, Total: 2.4 g/dL (ref 1.5–4.5)
Glucose: 98 mg/dL (ref 65–99)
Potassium: 4.1 mmol/L (ref 3.5–5.2)
Sodium: 141 mmol/L (ref 134–144)
Total Protein: 6.9 g/dL (ref 6.0–8.5)

## 2019-01-31 LAB — CBC WITH DIFFERENTIAL/PLATELET
Basophils Absolute: 0 10*3/uL (ref 0.0–0.2)
Basos: 1 %
EOS (ABSOLUTE): 0.2 10*3/uL (ref 0.0–0.4)
Eos: 4 %
Hematocrit: 36.6 % (ref 34.0–46.6)
Hemoglobin: 12.5 g/dL (ref 11.1–15.9)
Lymphocytes Absolute: 1.4 10*3/uL (ref 0.7–3.1)
Lymphs: 24 %
MCH: 31.2 pg (ref 26.6–33.0)
MCHC: 34.2 g/dL (ref 31.5–35.7)
MCV: 91 fL (ref 79–97)
Monocytes Absolute: 0.4 10*3/uL (ref 0.1–0.9)
Monocytes: 6 %
Neutrophils Absolute: 3.8 10*3/uL (ref 1.4–7.0)
Neutrophils: 65 %
Platelets: 321 10*3/uL (ref 150–450)
RBC: 4.01 x10E6/uL (ref 3.77–5.28)
RDW: 14.9 % (ref 11.7–15.4)
WBC: 5.8 10*3/uL (ref 3.4–10.8)

## 2019-01-31 NOTE — Progress Notes (Signed)
CBC and CMP are within normal limits.

## 2019-02-13 ENCOUNTER — Telehealth: Payer: Self-pay | Admitting: Rheumatology

## 2019-02-13 NOTE — Telephone Encounter (Signed)
Advised patient Dr. Corliss Skains does recommend the COVID-19 vaccine and it is okay to receive the vaccine since the vaccine is not live. Advised patient she should contact her PCP or the health department to inquire about eligibility. Patient verbalized understanding.

## 2019-02-13 NOTE — Telephone Encounter (Signed)
Patient called regarding the COVID vaccine and if Dr. Corliss Skains would recommend her getting it when it becomes available to her.

## 2019-02-27 NOTE — Progress Notes (Signed)
Office Visit Note  Patient: Frances Watkins             Date of Birth: 24-Mar-1965           MRN: [id number]             PCP: Alinda Deem, MD Referring: Alinda Deem, MD Visit Date: 03/06/2019 Occupation: @GUAROCC @  Subjective:  Pain in multiple joints   History of Present Illness: Frances Watkins is a 54 y.o. female with history of psoriatic arthritis.  She is on MTX 0.8 ml sq once weekly and folic acid 2 mg po daily.  She states she is having pain in both hands, both elbow joints, and both knee joints.  She denies any joint swelling at this time.  She states that she continues have intermittent SI joint pain bilaterally.  She has ongoing plantar fasciitis bilaterally.  She has noticed 70% improvement in her psoriasis.  She has not had a change in insurance so she does not think she will qualify for patient assistance for any biologic agents.   Activities of Daily Living:  Patient reports morning stiffness for 2 hours.   Patient Denies nocturnal pain.  Difficulty dressing/grooming: Denies Difficulty climbing stairs: Denies Difficulty getting out of chair: Denies Difficulty using hands for taps, buttons, cutlery, and/or writing: Reports  Review of Systems  Constitutional: Positive for fatigue.  HENT: Negative for mouth sores, mouth dryness and nose dryness.   Eyes: Negative for pain, visual disturbance and dryness.  Respiratory: Negative for cough, hemoptysis, shortness of breath and difficulty breathing.   Cardiovascular: Positive for palpitations. Negative for chest pain, hypertension and swelling in legs/feet.  Gastrointestinal: Negative for blood in stool, constipation and diarrhea.  Endocrine: Negative for increased urination.  Genitourinary: Negative for painful urination.  Musculoskeletal: Positive for arthralgias and joint pain. Negative for joint swelling, myalgias, muscle weakness, morning stiffness, muscle tenderness and myalgias.  Skin: Positive for rash (Psoriasis ).  Negative for color change, pallor, hair loss, nodules/bumps, skin tightness, ulcers and sensitivity to sunlight.  Allergic/Immunologic: Negative for susceptible to infections.  Neurological: Negative for dizziness, numbness, headaches and weakness.  Hematological: Negative for swollen glands.  Psychiatric/Behavioral: Negative for depressed mood and sleep disturbance. The patient is not nervous/anxious.     PMFS History:  Patient Active Problem List   Diagnosis Date Noted  . Psoriasis 09/21/2018  . Anxiety 09/21/2018  . Essential hypertension 09/21/2018  . History of kidney stones 09/21/2018    Past Medical History:  Diagnosis Date  . Heart murmur    no treatment  . History of kidney stones   . PONV (postoperative nausea and vomiting)    no problem  03/14/12  . Psoriasis     Family History  Problem Relation Age of Onset  . Arthritis Mother   . Diabetes Father   . Hypertension Father   . High Cholesterol Father   . Heart disease Father   . Heart attack Father   . Gout Father   . Healthy Sister   . Psoriasis Daughter        psoriatic arthritis  . Healthy Daughter    Past Surgical History:  Procedure Laterality Date  . COLONOSCOPY     2014, 2019  . HOLMIUM LASER APPLICATION Right 03/23/2012   Procedure: HOLMIUM LASER APPLICATION;  Surgeon: 03/25/2012, MD;  Location: [facility];  Service: Urology;  Laterality: Right;  . INNER EAR SURGERY  2000  . LITHOTRIPSY  x2  . TONSILLECTOMY     age 53 yrs.  . TYMPANOPLASTY W/ MASTOIDECTOMY     x2  . UPPER GI ENDOSCOPY     2014, 2019  . URETEROSCOPY  2014   Social History   Social History Narrative  . Not on file   Immunization History  Administered Date(s) Administered  . Pneumococcal Conjugate-13 09/27/2018     Objective: Vital Signs: BP (!) 180/93 (BP Location: Left Arm, Patient Position: Sitting, Cuff Size: Small)   Pulse (!) 48   Resp 12   Ht 5\' 3"  (1.6 m)   Wt 187 lb 6.4 oz (85 kg)   BMI  33.20 kg/m    Physical Exam Vitals and nursing note reviewed.  Constitutional:      Appearance: She is well-developed.  HENT:     Head: Normocephalic and atraumatic.  Eyes:     Conjunctiva/sclera: Conjunctivae normal.  Cardiovascular:     Rate and Rhythm: Normal rate and regular rhythm.     Heart sounds: Normal heart sounds.  Pulmonary:     Effort: Pulmonary effort is normal.     Breath sounds: Normal breath sounds.  Abdominal:     General: Bowel sounds are normal.     Palpations: Abdomen is soft.  Musculoskeletal:     Cervical back: Normal range of motion.  Lymphadenopathy:     Cervical: No cervical adenopathy.  Skin:    General: Skin is warm and dry.     Capillary Refill: Capillary refill takes less than 2 seconds.  Neurological:     Mental Status: She is alert and oriented to person, place, and time.  Psychiatric:        Behavior: Behavior normal.      Musculoskeletal Exam: C-spine, thoracic spine, and lumbar spine good ROM. No tenderness over both SI joints. Shoulder joints, elbow joints, wrist joints, MCPs, PIPs, and DIPs good ROM with no synovitis.  Right 1st DIP joint tenderness.  Hip joints, knee joints, ankle joints, MTPs, PIPs, and DIPs good ROM with no synovitis.  No warmth or effusion of knee joints.  No tenderness or swelling of ankle joints. Plantar fasciitis bilaterally.  Pes cavus bilaterally.    CDAI Exam: CDAI Score: -- Patient Global: --; Provider Global: -- Swollen: --; Tender: -- Joint Exam 03/06/2019   No joint exam has been documented for this visit   There is currently no information documented on the homunculus. Go to the Rheumatology activity and complete the homunculus joint exam.  Investigation: No additional findings.  Imaging: No results found.  Recent Labs: Lab Results  Component Value Date   WBC 5.8 01/30/2019   HGB 12.5 01/30/2019   PLT 321 01/30/2019   NA 141 01/30/2019   K 4.1 01/30/2019   CL 103 01/30/2019   CO2 26  01/30/2019   GLUCOSE 98 01/30/2019   BUN 13 01/30/2019   CREATININE 0.71 01/30/2019   BILITOT 0.5 01/30/2019   ALKPHOS 73 01/30/2019   AST 20 01/30/2019   ALT 22 01/30/2019   PROT 6.9 01/30/2019   ALBUMIN 4.5 01/30/2019   CALCIUM 9.2 01/30/2019   GFRAA 114 01/30/2019    Speciality Comments: No specialty comments available.  Procedures:  No procedures performed Allergies: Sulfa drugs cross reactors   Assessment / Plan:     Visit Diagnoses: Psoriatic arthritis ([facility]): She has no synovitis or dactylitis on exam.  She has ongoing plantar fasciitis bilaterally.  She has intermittent pain in bilateral elbow joints, bilateral hands, and bilateral  knee joints.  She has intermittent SI joint pain bilaterally.  She has no SI joint tenderness on exam today.  She has noticed about 70% improvement in her psoriasis since starting on methotrexate.  She has been injecting methotrexate 0.8 mL once weekly.  We discussed increasing the dose of methotrexate to 1.0 mL once weekly and increasing folic acid to 2 mg by mouth daily.  She was in agreement with this plan.  She does not qualify for any patient assistance programs for biologic agents at this time.  She will be looking into changing her insurance plans in the future.  She advised to notify us if she develops increased joint pain or joint swelling.  She will follow-up in the office in 3 months.  Psoriasis: She has chronic patches of psoriasis on the extensor surface of both elbow joints and the anterior surface of the left lower extremity.  The psoriasis along her hairline and scalp has improved.  She states overall her psoriasis improved by about 70% since starting on methotrexate.  She has not been using any topical agents recently.  High risk medication use - -MTX 1.0 ml sq once weekly and folic acid 2 mg po daily (started in November 2020). She does not qualify for pt assistance for biologics due to her current insurance plan.  She will return for CBC  and CMP in March and every 3 months.  Standing orders are in place.  Chronic SI joint pain: She has intermittent pain in bilateral SI joints.  She has no tenderness on exam today.  Plantar fasciitis, bilateral: She has ongoing plantar fasciitis bilaterally.  Pes cavus noted bilaterally.  Discussed the importance of wearing proper fitting shoes with arch support.  Achilles tendinitis, left leg: She has intermittent symptoms of Achilles tendinitis.  No active inflammation was noted today.  Other medical conditions are listed as follows:  Essential hypertension  History of kidney stones  Solar elastosis  Anxiety  Orders: No orders of the defined types were placed in this encounter.  No orders of the defined types were placed in this encounter.     Follow-Up Instructions: Return in about 3 months (around 06/03/2019) for Psoriatic arthritis.  Hazel Sams, PA-C  I examined and evaluated the patient with Hazel Sams PA.  She continues to have joint pain and joint swelling.  She had no joint tenderness but had joint tenderness.  She also had plantar fasciitis on my exam.  She still have some ongoing psoriasis.  Due to her insurance situation she cannot take any Biologics currently.  After detailed discussion we decided to increase her methotrexate dose to 1 mL subcu weekly.  The plan of care was discussed as noted above.  Bo Merino, MD   Note - This record has been created using Editor, commissioning.  Chart creation errors have been sought, but may not always  have been located. Such creation errors do not reflect on  the standard of medical care.

## 2019-03-01 ENCOUNTER — Ambulatory Visit: Payer: No Typology Code available for payment source | Admitting: Rheumatology

## 2019-03-06 ENCOUNTER — Other Ambulatory Visit: Payer: Self-pay

## 2019-03-06 ENCOUNTER — Encounter: Payer: Self-pay | Admitting: Rheumatology

## 2019-03-06 ENCOUNTER — Ambulatory Visit: Payer: No Typology Code available for payment source | Admitting: Rheumatology

## 2019-03-06 VITALS — BP 180/93 | HR 48 | Resp 12 | Ht 63.0 in | Wt 187.4 lb

## 2019-03-06 DIAGNOSIS — L409 Psoriasis, unspecified: Secondary | ICD-10-CM

## 2019-03-06 DIAGNOSIS — I10 Essential (primary) hypertension: Secondary | ICD-10-CM

## 2019-03-06 DIAGNOSIS — Z87442 Personal history of urinary calculi: Secondary | ICD-10-CM

## 2019-03-06 DIAGNOSIS — Z79899 Other long term (current) drug therapy: Secondary | ICD-10-CM

## 2019-03-06 DIAGNOSIS — F419 Anxiety disorder, unspecified: Secondary | ICD-10-CM

## 2019-03-06 DIAGNOSIS — L4050 Arthropathic psoriasis, unspecified: Secondary | ICD-10-CM

## 2019-03-06 DIAGNOSIS — M533 Sacrococcygeal disorders, not elsewhere classified: Secondary | ICD-10-CM | POA: Diagnosis not present

## 2019-03-06 DIAGNOSIS — M722 Plantar fascial fibromatosis: Secondary | ICD-10-CM

## 2019-03-06 DIAGNOSIS — G8929 Other chronic pain: Secondary | ICD-10-CM

## 2019-03-06 DIAGNOSIS — L578 Other skin changes due to chronic exposure to nonionizing radiation: Secondary | ICD-10-CM

## 2019-03-06 DIAGNOSIS — M7662 Achilles tendinitis, left leg: Secondary | ICD-10-CM

## 2019-03-06 NOTE — Patient Instructions (Signed)
Standing Labs We placed an order today for your standing lab work.    Please come back and get your standing labs in march and every 3 months  We have open lab daily Monday through Thursday from 8:30-12:30 PM and 1:30-4:30 PM and Friday from 8:30-12:30 PM and 1:30-4:00 PM at the office of Dr. Shaili Deveshwar.   You may experience shorter wait times on Monday and Friday afternoons. The office is located at [address], [address], [place], [place] [address] No appointment is necessary.   Labs are drawn by [facility].  You may receive a bill from [facility] for your lab work.  If you wish to have your labs drawn at another location, please call the office 24 hours in advance to send orders.  If you have any questions regarding directions or hours of operation,  please call [phone].   Just as a reminder please drink plenty of water prior to coming for your lab work. Thanks!  

## 2019-04-04 ENCOUNTER — Telehealth: Payer: Self-pay | Admitting: Rheumatology

## 2019-04-04 DIAGNOSIS — L4050 Arthropathic psoriasis, unspecified: Secondary | ICD-10-CM

## 2019-04-04 MED ORDER — METHOTREXATE SODIUM CHEMO INJECTION 50 MG/2ML: INTRAMUSCULAR | 0 refills | Status: DC

## 2019-04-04 NOTE — Telephone Encounter (Signed)
Patient called requesting prescription refill of Methotrexate to be sent to [facility] in [place].

## 2019-04-04 NOTE — Telephone Encounter (Signed)
Last Visit: 03/06/19 Next Visit: 06/07/19 Labs: 01/30/19 WNL  Okay to refill per Dr. Corliss Skains

## 2019-05-01 ENCOUNTER — Telehealth: Payer: Self-pay | Admitting: Rheumatology

## 2019-05-01 DIAGNOSIS — Z79899 Other long term (current) drug therapy: Secondary | ICD-10-CM

## 2019-05-01 NOTE — Telephone Encounter (Signed)
Patient called stating she just received a bill from [facility] for dates of service 12/19/2019 and 01/03/20.  Patient states she called [employer] to find out why the labwork wasn't covered.  Patient states [employer] said it was denied based on diagnosis of condition.  Patient states she gets the labwork for her Methotrexate prescription.  Patient states she had labwork on 09/21/18 which was accepted and is asking if the same diagnosis was used.  Patient requested a return call.

## 2019-05-01 NOTE — Telephone Encounter (Signed)
Patient advised we use the same diagnosis on all of her labs for 12/19/18, 01/03/19 and 01/29/19. Patient advised it is the high risk medication code we use as she is on MTX. Patient verbalized understanding and will contact her insurance company. Patient advised we received the fax with her lab results. Patient advised we will need a CBC as the labs sen only had a CMP and lipid panel. Orders released for patient.

## 2019-05-02 ENCOUNTER — Telehealth: Payer: Self-pay | Admitting: *Deleted

## 2019-05-02 NOTE — Telephone Encounter (Signed)
Received lab results from 04/18/19 from PCP Reviewed by Sherron Ales. PA-C  Lipid Panel and CMP WNL

## 2019-05-05 LAB — CBC WITH DIFFERENTIAL/PLATELET
Basophils Absolute: 0.1 10*3/uL (ref 0.0–0.2)
Basos: 1 %
EOS (ABSOLUTE): 0.2 10*3/uL (ref 0.0–0.4)
Eos: 3 %
Hematocrit: 38.4 % (ref 34.0–46.6)
Hemoglobin: 13.1 g/dL (ref 11.1–15.9)
Immature Grans (Abs): 0 10*3/uL (ref 0.0–0.1)
Immature Granulocytes: 0 %
Lymphocytes Absolute: 1.2 10*3/uL (ref 0.7–3.1)
Lymphs: 22 %
MCH: 31.1 pg (ref 26.6–33.0)
MCHC: 34.1 g/dL (ref 31.5–35.7)
MCV: 91 fL (ref 79–97)
Monocytes Absolute: 0.2 10*3/uL (ref 0.1–0.9)
Monocytes: 4 %
Neutrophils Absolute: 4 10*3/uL (ref 1.4–7.0)
Neutrophils: 70 %
Platelets: 289 10*3/uL (ref 150–450)
RBC: 4.21 x10E6/uL (ref 3.77–5.28)
RDW: 13.9 % (ref 11.7–15.4)
WBC: 5.6 10*3/uL (ref 3.4–10.8)

## 2019-05-06 NOTE — Telephone Encounter (Signed)
CBC normal

## 2019-06-01 NOTE — Progress Notes (Deleted)
Office Visit Note  Patient: Frances Watkins             Date of Birth: Oct 24, 1965           MRN: [id number]             PCP: Greig Right, MD Referring: Greig Right, MD Visit Date: 06/07/2019 Occupation: @GUAROCC @  Subjective:  No chief complaint on file.   History of Present Illness: Frances Watkins is a 54 y.o. female ***   Activities of Daily Living:  Patient reports morning stiffness for *** {minute/hour:19697}.   Patient {ACTIONS;DENIES/REPORTS:21021675::"Denies"} nocturnal pain.  Difficulty dressing/grooming: {ACTIONS;DENIES/REPORTS:21021675::"Denies"} Difficulty climbing stairs: {ACTIONS;DENIES/REPORTS:21021675::"Denies"} Difficulty getting out of chair: {ACTIONS;DENIES/REPORTS:21021675::"Denies"} Difficulty using hands for taps, buttons, cutlery, and/or writing: {ACTIONS;DENIES/REPORTS:21021675::"Denies"}  No Rheumatology ROS completed.   PMFS History:  Patient Active Problem List   Diagnosis Date Noted  . Psoriasis 09/21/2018  . Anxiety 09/21/2018  . Essential hypertension 09/21/2018  . History of kidney stones 09/21/2018    Past Medical History:  Diagnosis Date  . Heart murmur    no treatment  . History of kidney stones   . PONV (postoperative nausea and vomiting)    no problem  03/14/12  . Psoriasis     Family History  Problem Relation Age of Onset  . Arthritis Mother   . Diabetes Father   . Hypertension Father   . High Cholesterol Father   . Heart disease Father   . Heart attack Father   . Gout Father   . Healthy Sister   . Psoriasis Daughter        psoriatic arthritis  . Healthy Daughter    Past Surgical History:  Procedure Laterality Date  . COLONOSCOPY     2014, 2019  . HOLMIUM LASER APPLICATION Right 07/08/3014   Procedure: HOLMIUM LASER APPLICATION;  Surgeon: Malka So, MD;  Location: [facility];  Service: Urology;  Laterality: Right;  . INNER EAR SURGERY  2000  . LITHOTRIPSY     x2  . TONSILLECTOMY     age 72 yrs.   . TYMPANOPLASTY W/ MASTOIDECTOMY     x2  . UPPER GI ENDOSCOPY     2014, 2019  . URETEROSCOPY  2014   Social History   Social History Narrative  . Not on file   Immunization History  Administered Date(s) Administered  . Pneumococcal Conjugate-13 09/27/2018     Objective: Vital Signs: There were no vitals taken for this visit.   Physical Exam   Musculoskeletal Exam: ***  CDAI Exam: CDAI Score: -- Patient Global: --; Provider Global: -- Swollen: --; Tender: -- Joint Exam 06/07/2019   No joint exam has been documented for this visit   There is currently no information documented on the homunculus. Go to the Rheumatology activity and complete the homunculus joint exam.  Investigation: No additional findings.  Imaging: No results found.  Recent Labs: Lab Results  Component Value Date   WBC 5.6 05/04/2019   HGB 13.1 05/04/2019   PLT 289 05/04/2019   NA 141 01/30/2019   K 4.1 01/30/2019   CL 103 01/30/2019   CO2 26 01/30/2019   GLUCOSE 98 01/30/2019   BUN 13 01/30/2019   CREATININE 0.71 01/30/2019   BILITOT 0.5 01/30/2019   ALKPHOS 73 01/30/2019   AST 20 01/30/2019   ALT 22 01/30/2019   PROT 6.9 01/30/2019   ALBUMIN 4.5 01/30/2019   CALCIUM 9.2 01/30/2019   GFRAA 114 01/30/2019    Speciality  Comments: No specialty comments available.  Procedures:  No procedures performed Allergies: Sulfa drugs cross reactors   Assessment / Plan:     Visit Diagnoses: No diagnosis found.  Orders: No orders of the defined types were placed in this encounter.  No orders of the defined types were placed in this encounter.   Face-to-face time spent with patient was *** minutes. Greater than 50% of time was spent in counseling and coordination of care.  Follow-Up Instructions: No follow-ups on file.   Gearldine Bienenstock, PA-C  Note - This record has been created using Dragon software.  Chart creation errors have been sought, but may not always  have been located.  Such creation errors do not reflect on  the standard of medical care.

## 2019-06-07 ENCOUNTER — Ambulatory Visit: Payer: No Typology Code available for payment source | Admitting: Rheumatology

## 2019-06-11 NOTE — Progress Notes (Addendum)
Office Visit Note  Patient: Frances Watkins             Date of Birth: Apr 24, 1965           MRN: [id number]             PCP: Greig Right, MD Referring: Greig Right, MD Visit Date: 06/19/2019 Occupation: @GUAROCC @  Subjective:  Pain in ankles and feet.   History of Present Illness: Frances Watkins is a 54 y.o. female with history of psoriatic arthritis, psoriasis and osteoarthritis.  She states she has been taking methotrexate 1 mL subcu weekly.  She continues to have some discomfort in her Achilles tendon and plantar fascia.  She has noticed some swelling intermittently in her feet.  She states her psoriasis is well controlled.  She also has some SI joint discomfort and lower back discomfort.  Activities of Daily Living:  Patient reports morning stiffness for 24 hours.   Patient Reports nocturnal pain.  Difficulty dressing/grooming: Denies Difficulty climbing stairs: Reports Difficulty getting out of chair: Reports Difficulty using hands for taps, buttons, cutlery, and/or writing: Denies  Review of Systems  Constitutional: Positive for fatigue. Negative for night sweats, weight gain and weight loss.  HENT: Negative for mouth sores, trouble swallowing, trouble swallowing, mouth dryness and nose dryness.   Eyes: Negative for pain, redness, visual disturbance and dryness.  Respiratory: Negative for cough, shortness of breath and difficulty breathing.   Cardiovascular: Positive for swelling in legs/feet. Negative for chest pain, palpitations, hypertension and irregular heartbeat.  Gastrointestinal: Negative for blood in stool, constipation and diarrhea.  Endocrine: Negative for excessive thirst and increased urination.  Genitourinary: Negative for vaginal dryness.  Musculoskeletal: Positive for arthralgias, joint pain, joint swelling and morning stiffness. Negative for myalgias, muscle weakness, muscle tenderness and myalgias.  Skin: Negative for color change, rash, hair loss, skin  tightness, ulcers and sensitivity to sunlight.  Allergic/Immunologic: Negative for susceptible to infections.  Neurological: Negative for dizziness, numbness, memory loss, night sweats and weakness.  Hematological: Negative for bruising/bleeding tendency and swollen glands.  Psychiatric/Behavioral: Negative for depressed mood and sleep disturbance. The patient is not nervous/anxious.     PMFS History:  Patient Active Problem List   Diagnosis Date Noted  . Psoriasis 09/21/2018  . Anxiety 09/21/2018  . Essential hypertension 09/21/2018  . History of kidney stones 09/21/2018    Past Medical History:  Diagnosis Date  . Heart murmur    no treatment  . History of kidney stones   . PONV (postoperative nausea and vomiting)    no problem  03/14/12  . Psoriasis     Family History  Problem Relation Age of Onset  . Arthritis Mother   . Diabetes Father   . Hypertension Father   . High Cholesterol Father   . Heart disease Father   . Heart attack Father   . Gout Father   . Healthy Sister   . Psoriasis Daughter        psoriatic arthritis  . Healthy Daughter    Past Surgical History:  Procedure Laterality Date  . COLONOSCOPY     2014, 2019  . HOLMIUM LASER APPLICATION Right 2/54/2706   Procedure: HOLMIUM LASER APPLICATION;  Surgeon: Malka So, MD;  Location: [facility];  Service: Urology;  Laterality: Right;  . INNER EAR SURGERY  2000  . LITHOTRIPSY     x2  . TONSILLECTOMY     age 72 yrs.  . TYMPANOPLASTY W/ MASTOIDECTOMY  x2  . UPPER GI ENDOSCOPY     2014, 2019  . URETEROSCOPY  2014   Social History   Social History Narrative  . Not on file   Immunization History  Administered Date(s) Administered  . Pneumococcal Conjugate-13 09/27/2018     Objective: Vital Signs: BP (!) 154/86 (BP Location: Left Arm, Patient Position: Sitting, Cuff Size: Normal)   Pulse (!) 47   Resp 14   Ht 5\' 3"  (1.6 m)   Wt 191 lb (86.6 kg)   BMI 33.83 kg/m    Physical  Exam Vitals and nursing note reviewed.  Constitutional:      Appearance: She is well-developed.  HENT:     Head: Normocephalic and atraumatic.  Eyes:     Conjunctiva/sclera: Conjunctivae normal.  Cardiovascular:     Rate and Rhythm: Normal rate and regular rhythm.     Heart sounds: Normal heart sounds.  Pulmonary:     Effort: Pulmonary effort is normal.     Breath sounds: Normal breath sounds.  Abdominal:     General: Bowel sounds are normal.     Palpations: Abdomen is soft.  Musculoskeletal:     Cervical back: Normal range of motion.  Lymphadenopathy:     Cervical: No cervical adenopathy.  Skin:    General: Skin is warm and dry.     Capillary Refill: Capillary refill takes less than 2 seconds.  Neurological:     Mental Status: She is alert and oriented to person, place, and time.  Psychiatric:        Behavior: Behavior normal.      Musculoskeletal Exam: C-spine thoracic and lumbar spine were in good range of motion.  She has some tenderness on palpation of her bilateral SI joints.  Shoulder joints, elbow joints, wrist joints with good range of motion.  She has some DIP thickening but no synovitis was noted.  Hip joints, knee joints and ankle joints with good range of motion.  She has tenderness on palpation of bilateral Achilles tendon.  She has mild tenderness of her plantar fascia.  No synovitis was noted over ankle joints or MTPs or PIPs or DIPs.  CDAI Exam: CDAI Score: -- Patient Global: --; Provider Global: -- Swollen: --; Tender: -- Joint Exam 06/19/2019   No joint exam has been documented for this visit   There is currently no information documented on the homunculus. Go to the Rheumatology activity and complete the homunculus joint exam.  Investigation: No additional findings.  Imaging: No results found.  Recent Labs: Lab Results  Component Value Date   WBC 5.6 05/04/2019   HGB 13.1 05/04/2019   PLT 289 05/04/2019   NA 141 01/30/2019   K 4.1 01/30/2019    CL 103 01/30/2019   CO2 26 01/30/2019   GLUCOSE 98 01/30/2019   BUN 13 01/30/2019   CREATININE 0.71 01/30/2019   BILITOT 0.5 01/30/2019   ALKPHOS 73 01/30/2019   AST 20 01/30/2019   ALT 22 01/30/2019   PROT 6.9 01/30/2019   ALBUMIN 4.5 01/30/2019   CALCIUM 9.2 01/30/2019   GFRAA 114 01/30/2019    Speciality Comments: No specialty comments available.  Procedures:  No procedures performed Allergies: Sulfa drugs cross reactors   Assessment / Plan:     Visit Diagnoses: Psoriatic arthritis (HCC)-patient had no synovitis on my examination today.  Although she continues to have Achilles tendinitis and plantar fasciitis.  She is on methotrexate 1 mL subcu weekly.  Her arthritis is not well controlled.  She is not getting patient assistance for Biologics.  She will continue current treatment.  She states she will consider changing her insurance in the future.  We had detailed discussion that methotrexate is not clinically helpful with enthesitis and she will have to go on Biologics for improving her symptoms.  Psoriasis-she has no active psoriasis patches.  She has some dryness over her elbows.  High risk medication use - MTX 1.0 ml sq once weekly and folic acid 2 mg po daily (started in November 2020). She does not qualify for pt assistance for biologics due to her current insur -her CBC was normal in April.  Was normal plan: COMPLETE METABOLIC PANEL WITH GFR  Chronic SI joint pain-she continues to have some SI joint discomfort.  Plantar fasciitis, bilateral-she had tenderness over plantar fascia.  Achilles tendinitis, left leg-she has tenderness over bilateral Achilles tendon and some swelling over left Achilles tendon.  Essential hypertension-her blood pressure is elevated.  History of kidney stones  Solar elastosis  Anxiety  Orders: Orders Placed This Encounter  Procedures  . COMPLETE METABOLIC PANEL WITH GFR   Meds ordered this encounter  Medications  . methotrexate 50  MG/2ML injection    Sig: inject 1 mL under the skin once weekly.    Dispense:  10 mL    Refill:  0    Please dispense 2 mL vials with preservative    Follow-Up Instructions: Return in about 5 months (around 11/19/2019) for Psoriatic arthritis.   Pollyann Savoy, MD  Note - This record has been created using Animal nutritionist.  Chart creation errors have been sought, but may not always  have been located. Such creation errors do not reflect on  the standard of medical care.

## 2019-06-19 ENCOUNTER — Other Ambulatory Visit: Payer: Self-pay

## 2019-06-19 ENCOUNTER — Encounter: Payer: Self-pay | Admitting: Rheumatology

## 2019-06-19 ENCOUNTER — Ambulatory Visit (INDEPENDENT_AMBULATORY_CARE_PROVIDER_SITE_OTHER): Payer: No Typology Code available for payment source | Admitting: Rheumatology

## 2019-06-19 VITALS — BP 154/86 | HR 47 | Resp 14 | Ht 63.0 in | Wt 191.0 lb

## 2019-06-19 DIAGNOSIS — M533 Sacrococcygeal disorders, not elsewhere classified: Secondary | ICD-10-CM | POA: Diagnosis not present

## 2019-06-19 DIAGNOSIS — Z87442 Personal history of urinary calculi: Secondary | ICD-10-CM

## 2019-06-19 DIAGNOSIS — M722 Plantar fascial fibromatosis: Secondary | ICD-10-CM

## 2019-06-19 DIAGNOSIS — L4050 Arthropathic psoriasis, unspecified: Secondary | ICD-10-CM | POA: Diagnosis not present

## 2019-06-19 DIAGNOSIS — L409 Psoriasis, unspecified: Secondary | ICD-10-CM | POA: Diagnosis not present

## 2019-06-19 DIAGNOSIS — F419 Anxiety disorder, unspecified: Secondary | ICD-10-CM

## 2019-06-19 DIAGNOSIS — I10 Essential (primary) hypertension: Secondary | ICD-10-CM

## 2019-06-19 DIAGNOSIS — L578 Other skin changes due to chronic exposure to nonionizing radiation: Secondary | ICD-10-CM

## 2019-06-19 DIAGNOSIS — Z79899 Other long term (current) drug therapy: Secondary | ICD-10-CM | POA: Diagnosis not present

## 2019-06-19 DIAGNOSIS — G8929 Other chronic pain: Secondary | ICD-10-CM

## 2019-06-19 DIAGNOSIS — M7662 Achilles tendinitis, left leg: Secondary | ICD-10-CM

## 2019-06-19 LAB — COMPLETE METABOLIC PANEL WITH GFR
AG Ratio: 1.6 (calc) (ref 1.0–2.5)
ALT: 14 U/L (ref 6–29)
AST: 17 U/L (ref 10–35)
Albumin: 4.1 g/dL (ref 3.6–5.1)
Alkaline phosphatase (APISO): 65 U/L (ref 37–153)
BUN: 13 mg/dL (ref 7–25)
CO2: 30 mmol/L (ref 20–32)
Calcium: 8.9 mg/dL (ref 8.6–10.4)
Chloride: 106 mmol/L (ref 98–110)
Creat: 0.72 mg/dL (ref 0.50–1.05)
GFR, Est African American: 112 mL/min/{1.73_m2} (ref >=60)
GFR, Est Non African American: 96 mL/min/{1.73_m2} (ref >=60)
Globulin: 2.5 g/dL (calc) (ref 1.9–3.7)
Glucose, Bld: 93 mg/dL (ref 65–99)
Potassium: 4.1 mmol/L (ref 3.5–5.3)
Sodium: 141 mmol/L (ref 135–146)
Total Bilirubin: 0.5 mg/dL (ref 0.2–1.2)
Total Protein: 6.6 g/dL (ref 6.1–8.1)

## 2019-06-19 MED ORDER — METHOTREXATE SODIUM CHEMO INJECTION 50 MG/2ML: INTRAMUSCULAR | 0 refills | Status: DC

## 2019-06-19 NOTE — Addendum Note (Signed)
Addended by: Pollyann Savoy on: 06/19/2019 12:28 PM   Modules accepted: Orders

## 2019-06-19 NOTE — Patient Instructions (Addendum)
Standing Labs We placed an order today for your standing lab work.    Please come back and get your standing labs (CBC with differential and CMP with GFR in August and every 3 months  We have open lab daily Monday through Thursday from 8:30-12:30 PM and 1:30-4:30 PM and Friday from 8:30-12:30 PM and 1:30-4:00 PM at the office of Dr. Pollyann Savoy.   You may experience shorter wait times on Monday and Friday afternoons. The office is located at [address], [address], [place], [place] [address] No appointment is necessary.   Labs are drawn by [facility].  You may receive a bill from [facility] for your lab work.  If you wish to have your labs drawn at another location, please call the office 24 hours in advance to send orders.  If you have any questions regarding directions or hours of operation,  please call [phone].   Just as a reminder please drink plenty of water prior to coming for your lab work. Thanks!

## 2019-06-20 NOTE — Progress Notes (Signed)
CMP is normal.

## 2019-08-17 ENCOUNTER — Other Ambulatory Visit: Payer: Self-pay | Admitting: *Deleted

## 2019-08-17 DIAGNOSIS — Z79899 Other long term (current) drug therapy: Secondary | ICD-10-CM

## 2019-08-17 DIAGNOSIS — L4050 Arthropathic psoriasis, unspecified: Secondary | ICD-10-CM

## 2019-08-17 MED ORDER — METHOTREXATE SODIUM CHEMO INJECTION 50 MG/2ML: INTRAMUSCULAR | 0 refills | Status: DC

## 2019-08-17 NOTE — Telephone Encounter (Signed)
Refill request received via fax  Last Visit: 06/19/2019 Next Visit: 11/19/2019 Labs: 05/04/2019 CBC WNL 06/19/2019 CMP WNL  Current Dose per office note 06/19/2019: methotrexate 1 mL subcu weekly DX: Psoriatic arthritis   Patient advised she is due to update labs. Patient states she will update with PCP next week. Lab Orders faxed.   Okay to refill MTX?

## 2019-08-20 ENCOUNTER — Telehealth: Payer: Self-pay | Admitting: Rheumatology

## 2019-08-20 NOTE — Telephone Encounter (Signed)
Patient called stating she spoke with you last week regarding her labwork orders faxed to her PCP.  Patient states she spoke with their office this morning and they have not received the orders.  Patient is requesting the orders be faxed again and please add Attention: Sissy to the cover sheet so it gets to the correct department.

## 2019-08-20 NOTE — Telephone Encounter (Signed)
Arline Asp from [facility] called stating she received labwork orders for patient for CMP and CBC.  Arline Asp stated patient had that labwork in June and will fax the results to the office.  If additional labwork is still needed, please call back at #[phone]

## 2019-08-20 NOTE — Telephone Encounter (Signed)
Lab Orders faxed again with Attn to Sissy.

## 2019-08-20 NOTE — Telephone Encounter (Signed)
Noted. CBC and CMP is only thing needed. Will review with provider once received.

## 2019-08-22 ENCOUNTER — Telehealth: Payer: Self-pay | Admitting: *Deleted

## 2019-08-22 NOTE — Telephone Encounter (Signed)
Lab results received from [facility]  Drawn on 07/19/2019 Reviewed by Sherron Ales, PA-C   Lipid Panel, CBC, CMP  All labs WNL.

## 2019-10-02 ENCOUNTER — Telehealth: Payer: Self-pay | Admitting: Rheumatology

## 2019-10-02 DIAGNOSIS — L4050 Arthropathic psoriasis, unspecified: Secondary | ICD-10-CM

## 2019-10-02 MED ORDER — METHOTREXATE SODIUM CHEMO INJECTION 50 MG/2ML: INTRAMUSCULAR | 0 refills | Status: DC

## 2019-10-02 NOTE — Telephone Encounter (Signed)
Last Visit: 06/19/2019 Next Visit: 11/19/2019 Labs: 07/19/2019 CBC, CMP WNL  Current Dose per office note 06/19/2019: methotrexate 1 mL subcu weekly DX: Psoriatic arthritis   Okay to refill per Dr. Corliss Skains

## 2019-10-02 NOTE — Telephone Encounter (Signed)
Patient called requesting prescription refill of Methotrexate to be sent to [facility].  Patient states she only has 1/2 vial remaining.

## 2019-10-31 ENCOUNTER — Other Ambulatory Visit: Payer: Self-pay

## 2019-10-31 DIAGNOSIS — L4050 Arthropathic psoriasis, unspecified: Secondary | ICD-10-CM

## 2019-10-31 MED ORDER — METHOTREXATE SODIUM CHEMO INJECTION 50 MG/2ML: INTRAMUSCULAR | 0 refills | Status: DC

## 2019-10-31 NOTE — Telephone Encounter (Signed)
Please clarify if she has had recent lab work at her PCPs office.  She is overdue to update CBC and CMP.

## 2019-10-31 NOTE — Telephone Encounter (Signed)
Last Visit: 06/19/2019 Next Visit: 11/19/2019 Labs: 06/19/2019 - CMP is normal. 05/04/2019 - CBC normal. Patient is having updated labs faxed from PCP.   Current Dose per office note 06/19/2019: Methotrexate 1 mL subcu weekly DX: Psoriatic arthritis   Okay to refill MTX?

## 2019-10-31 NOTE — Telephone Encounter (Signed)
Patient called requesting prescription refill of Methotrexate to be sent to [facility].  Patient states her next appointment is scheduled for 11/19/19 and doesn't have enough medication for 2 more injections.  Patient states she had labwork with her PCP at [facility] and will have them fax the results.

## 2019-10-31 NOTE — Telephone Encounter (Signed)
CBC/ CMP drawn on 10/19/2019 WNL

## 2019-11-01 ENCOUNTER — Telehealth: Payer: Self-pay | Admitting: *Deleted

## 2019-11-01 NOTE — Telephone Encounter (Signed)
Labs received from [facility] Reviewed by Dr. Corliss Skains  Drawn on 10/19/2019   CBC/CMP/Lipid Panel  All WNL.

## 2019-11-05 NOTE — Progress Notes (Signed)
Office Visit Note  Patient: Frances Watkins             Date of Birth: [birth year]           MRN: [id number]             PCP: Alinda Deem, MD Referring: Alinda Deem, MD Visit Date: 11/19/2019 Occupation: @GUAROCC @  Subjective:  Psoriasis   History of Present Illness: RUTHE ROEMER is a 54 y.o. female with history of psoriatic arthritis.  She is on MTX 1.0 ml sq injections once weekly and folic acid 2 mg po daily.  She has not missed any doses of methotrexate or folic acid recently.  She has been unable to start on Biologics due to her insurance plan and being a 46.  She reports that she continues to have recurrent flares.  She has ongoing plantar fasciitis and Achilles tendinitis bilaterally.  She has intermittent SI joint pain bilaterally.  She states that she recently was moving and developed severe pain and swelling in her left ankle joint.  She is currently taking a prednisone taper as prescribed which has alleviated a lot of her discomfort and inflammation.She has noticed a few patches of psoriasis behind both ears as well as along her hairline.    Activities of Daily Living:  Patient reports morning stiffness for several hours.   Patient Reports nocturnal pain.  Difficulty dressing/grooming: Reports Difficulty climbing stairs: Reports Difficulty getting out of chair: Reports Difficulty using hands for taps, buttons, cutlery, and/or writing: Denies  Review of Systems  Constitutional: Positive for fatigue.  HENT: Negative for mouth sores, mouth dryness and nose dryness.   Eyes: Negative for pain, itching and dryness.  Respiratory: Negative for shortness of breath and difficulty breathing.   Cardiovascular: Positive for palpitations. Negative for chest pain.  Gastrointestinal: Negative for blood in stool, constipation and diarrhea.  Endocrine: Negative for increased urination.  Genitourinary: Negative for difficulty urinating.  Musculoskeletal: Positive for  arthralgias, joint pain and morning stiffness. Negative for joint swelling, myalgias, muscle tenderness and myalgias.  Skin: Positive for rash. Negative for color change.  Allergic/Immunologic: Negative for susceptible to infections.  Neurological: Negative for dizziness, numbness, headaches, memory loss and weakness.  Hematological: Negative for bruising/bleeding tendency.  Psychiatric/Behavioral: Negative for confusion and sleep disturbance.    PMFS History:  Patient Active Problem List   Diagnosis Date Noted  . Psoriasis 09/21/2018  . Anxiety 09/21/2018  . Essential hypertension 09/21/2018  . History of kidney stones 09/21/2018    Past Medical History:  Diagnosis Date  . Heart murmur    no treatment  . History of kidney stones   . PONV (postoperative nausea and vomiting)    no problem  03/14/12  . Psoriasis     Family History  Problem Relation Age of Onset  . Arthritis Mother   . Diabetes Father   . Hypertension Father   . High Cholesterol Father   . Heart disease Father   . Heart attack Father   . Gout Father   . Healthy Sister   . Psoriasis Daughter        psoriatic arthritis  . Healthy Daughter    Past Surgical History:  Procedure Laterality Date  . COLONOSCOPY     2014, 2019  . HOLMIUM LASER APPLICATION Right 03/23/2012   Procedure: HOLMIUM LASER APPLICATION;  Surgeon: 03/25/2012, MD;  Location: [facility];  Service: Urology;  Laterality: Right;  . INNER EAR SURGERY  2000  . LITHOTRIPSY     x2  . TONSILLECTOMY     age 68 yrs.  . TYMPANOPLASTY W/ MASTOIDECTOMY     x2  . UPPER GI ENDOSCOPY     2014, 2019  . URETEROSCOPY  2014   Social History   Social History Narrative  . Not on file   Immunization History  Administered Date(s) Administered  . PFIZER SARS-COV-2 Vaccination 04/05/2019, 04/24/2019, 10/05/2019  . Pneumococcal Conjugate-13 09/27/2018     Objective: Vital Signs: BP (!) 161/82 (BP Location: Left Arm, Patient Position:  Sitting, Cuff Size: Normal)   Pulse (!) 43   Resp 14   Ht 5\' 3"  (1.6 m)   Wt 193 lb (87.5 kg)   BMI 34.19 kg/m    Physical Exam Vitals and nursing note reviewed.  Constitutional:      Appearance: She is well-developed.  HENT:     Head: Normocephalic and atraumatic.  Eyes:     Conjunctiva/sclera: Conjunctivae normal.  Pulmonary:     Effort: Pulmonary effort is normal.  Abdominal:     Palpations: Abdomen is soft.  Musculoskeletal:     Cervical back: Normal range of motion.  Skin:    General: Skin is warm and dry.     Capillary Refill: Capillary refill takes less than 2 seconds.  Neurological:     Mental Status: She is alert and oriented to person, place, and time.  Psychiatric:        Behavior: Behavior normal.      Musculoskeletal Exam: C-spine, thoracic spine, lumbar spine have good range of motion with no discomfort.  No midline spinal tenderness.  She has tenderness over both SI joints.  Shoulder joints, elbow joints, wrist joints, MCPs, PIPs, DIPs have good range of motion with no synovitis.  She is able to make a complete fist bilaterally.  She has DIP thickening consistent with osteoarthritis of both hands.  Tenderness of the right first DIP joint noted.  Left hip has good range of motion with no discomfort.  Right hip has slightly limited range of motion.  Knee joints have good range of motion with no warmth or effusion.  Tenderness of the left ankle joint noted.  Tenderness along the Achilles tendon and plantar fascia bilaterally.  No tenderness of MTP joints.   CDAI Exam: CDAI Score: -- Patient Global: --; Provider Global: -- Swollen: 0 ; Tender: 1  Joint Exam 11/19/2019      Right  Left  Ankle      Tender     Investigation: No additional findings.  Imaging: No results found.  Recent Labs: Lab Results  Component Value Date   WBC 5.6 05/04/2019   HGB 13.1 05/04/2019   PLT 289 05/04/2019   NA 141 06/19/2019   K 4.1 06/19/2019   CL 106 06/19/2019   CO2  30 06/19/2019   GLUCOSE 93 06/19/2019   BUN 13 06/19/2019   CREATININE 0.72 06/19/2019   BILITOT 0.5 06/19/2019   ALKPHOS 73 01/30/2019   AST 17 06/19/2019   ALT 14 06/19/2019   PROT 6.6 06/19/2019   ALBUMIN 4.5 01/30/2019   CALCIUM 8.9 06/19/2019   GFRAA 112 06/19/2019    Speciality Comments: No specialty comments available.  Procedures:  No procedures performed Allergies: Sulfa drugs cross reactors   Assessment / Plan:     Visit Diagnoses: Psoriatic arthritis (HCC): She continues to have recurrent psoriatic arthritis flares involving multiple joints.  She is currently on a prednisone taper which started  at 20 mg and she has been tapering by 5 mg every 4 days. The tenderness and swelling of the left ankle joint has improved significantly.  She has some residual left ankle joint tenderness on exam today.  She has persistent discomfort and intermittent inflammation of both achilles tendons and plantar fascia.  She experiences intermittent SI joint pain bilaterally.  She is currently on Methotrexate 1.0 ml sq injections once weekly and folic acid 2 mg po daily. Her insurance would not approve biologic therapy in the past.  She requires more aggressive treatment to control her psoriatic arthritis and psoriasis.  We will apply for the starter pack and prescription for Otezla 30 mg 1 tablet by mouth twice daily.  Indications, contraindications, and potential side effects of otezla were discussed today.  All questions were addressed and consent was obtained. She will continue on MTX and folic acid as prescribed.  A refill of MTX will be sent to the pharmacy.  She will follow up in 5 months or sooner if otezla is approved.  Counseled patient that [place] is a PDE 4 inhibitor that works to treat psoriasis and the joint pain and tenderness of psoriatic arthritis.  Counseled patient on purpose, proper use, and adverse effects of Otezla.  Reviewed the most common adverse effects of weight loss,  depression, nausea/diarrhea/vomiting, headaches, and nasal congestion.  Advised patient to notify office of any serious changes in mood and/or thoughts of suicide.  Provided patient with medication education material and answered all questions.  Patient consented to [place].    Patient dose will be Otezla starter titration pack and then 30 mg twice daily.  Prescription pending insurance approval and once approved patient may pick up sample for starter pack from our office  Psoriasis: She has a few small patches of psoriasis behind both ears and along her hairline.  She will continue on MTX 1.0 ml sq injections once weekly.  We will be applying for otezla 30 mg 1 tablet by mouth twice daily.   High risk medication use - MTX 1.0 ml sq injections once weekly and folic acid 2 mg po daily (started in November 2020). Applying for Otezla 30 mg 1 tablet by mouth BID.   CBC and CMP were drawn on 07/18/19.  She is due to update lab work. She was advised to update CBC and CMP ASAP at her PCPs office.  Orders were provided to the patient.  She was advised to have lab work drawn every 3 months to monitor for drug toxicity.  She has not had any recent infections.  She has received all 3 covid-19 vaccine doses.    Chronic SI joint pain: She has intermittent SI joint pain bilaterally.  Tenderness to palpation noted over both SI joints.   Plantar fasciitis, bilateral: She has ongoing tenderness and difficulty walking prolonged distances.    Achilles tendinitis, left leg: She has tenderness and mild inflammation on exam today.  Other medical conditions are listed as follows:   History of kidney stones  Essential hypertension  Solar elastosis  Anxiety  Orders: No orders of the defined types were placed in this encounter.  No orders of the defined types were placed in this encounter.     Follow-Up Instructions: Return in about 5 months (around 04/18/2020) for Psoriatic arthritis.   Gearldine Bienenstock,  PA-C  Note - This record has been created using Dragon software.  Chart creation errors have been sought, but may not always  have been located. Such  creation errors do not reflect on  the standard of medical care.

## 2019-11-09 ENCOUNTER — Other Ambulatory Visit: Payer: Self-pay

## 2019-11-09 MED ORDER — PREDNISONE 5 MG PO TABS
ORAL_TABLET | ORAL | 0 refills | Status: DC
Start: 2019-11-09 — End: 2020-01-15

## 2019-11-09 NOTE — Telephone Encounter (Signed)
Patient states she has not chosen a new insurance as of yet. Patient advised a prednisone taper will be sent to the pharmacy. We will further evaluate at her upcoming office visit on 11/19/19.

## 2019-11-09 NOTE — Telephone Encounter (Signed)
Patient called stating her left foot is swollen on the outside part of her foot.  Patient states she did a lot of walking and bending the last two weeks moving out of a house she has lived in for 22 years.  Patient states she doesn't remember twisting or hitting her foot.  Patient states she is not sure if the swelling and pain are from overuse or if she is having a flair.  Patient states when she rests, elevates, and uses ice it does feel better.  Patient is requesting a return call.

## 2019-11-09 NOTE — Telephone Encounter (Signed)
She is likely having a flare from overuse activities over the past 2 weeks.  Her psoriatic arthritis is uncontrolled on MTX 1.0 ml sq injections once weekly, but her insurance would not approve biologic therapy options.   Please ask if the patient has decided on new insurance yet.   Ok to send in a prednisone taper starting at 20 mg tapering by 5 mg every 4 days. We will further evaluate at her upcoming office visit on 11/19/19.

## 2019-11-19 ENCOUNTER — Other Ambulatory Visit: Payer: Self-pay

## 2019-11-19 ENCOUNTER — Encounter: Payer: Self-pay | Admitting: Physician Assistant

## 2019-11-19 ENCOUNTER — Ambulatory Visit (INDEPENDENT_AMBULATORY_CARE_PROVIDER_SITE_OTHER): Payer: No Typology Code available for payment source | Admitting: Physician Assistant

## 2019-11-19 ENCOUNTER — Telehealth: Payer: Self-pay

## 2019-11-19 VITALS — BP 161/82 | HR 43 | Resp 14 | Ht 63.0 in | Wt 193.0 lb

## 2019-11-19 DIAGNOSIS — Z87442 Personal history of urinary calculi: Secondary | ICD-10-CM

## 2019-11-19 DIAGNOSIS — F419 Anxiety disorder, unspecified: Secondary | ICD-10-CM

## 2019-11-19 DIAGNOSIS — Z79899 Other long term (current) drug therapy: Secondary | ICD-10-CM

## 2019-11-19 DIAGNOSIS — I10 Essential (primary) hypertension: Secondary | ICD-10-CM

## 2019-11-19 DIAGNOSIS — M533 Sacrococcygeal disorders, not elsewhere classified: Secondary | ICD-10-CM | POA: Diagnosis not present

## 2019-11-19 DIAGNOSIS — G8929 Other chronic pain: Secondary | ICD-10-CM

## 2019-11-19 DIAGNOSIS — L578 Other skin changes due to chronic exposure to nonionizing radiation: Secondary | ICD-10-CM

## 2019-11-19 DIAGNOSIS — L409 Psoriasis, unspecified: Secondary | ICD-10-CM | POA: Diagnosis not present

## 2019-11-19 DIAGNOSIS — M722 Plantar fascial fibromatosis: Secondary | ICD-10-CM

## 2019-11-19 DIAGNOSIS — L4050 Arthropathic psoriasis, unspecified: Secondary | ICD-10-CM | POA: Diagnosis not present

## 2019-11-19 DIAGNOSIS — M7662 Achilles tendinitis, left leg: Secondary | ICD-10-CM

## 2019-11-19 IMAGING — DX CHEST - 2 VIEW
2 series · 2 of 2 positions shown · non-contrast
Comparison: 10/04/2017

CLINICAL DATA: Immunosuppressive therapy

EXAM:
CHEST - 2 VIEW

[chest pa]
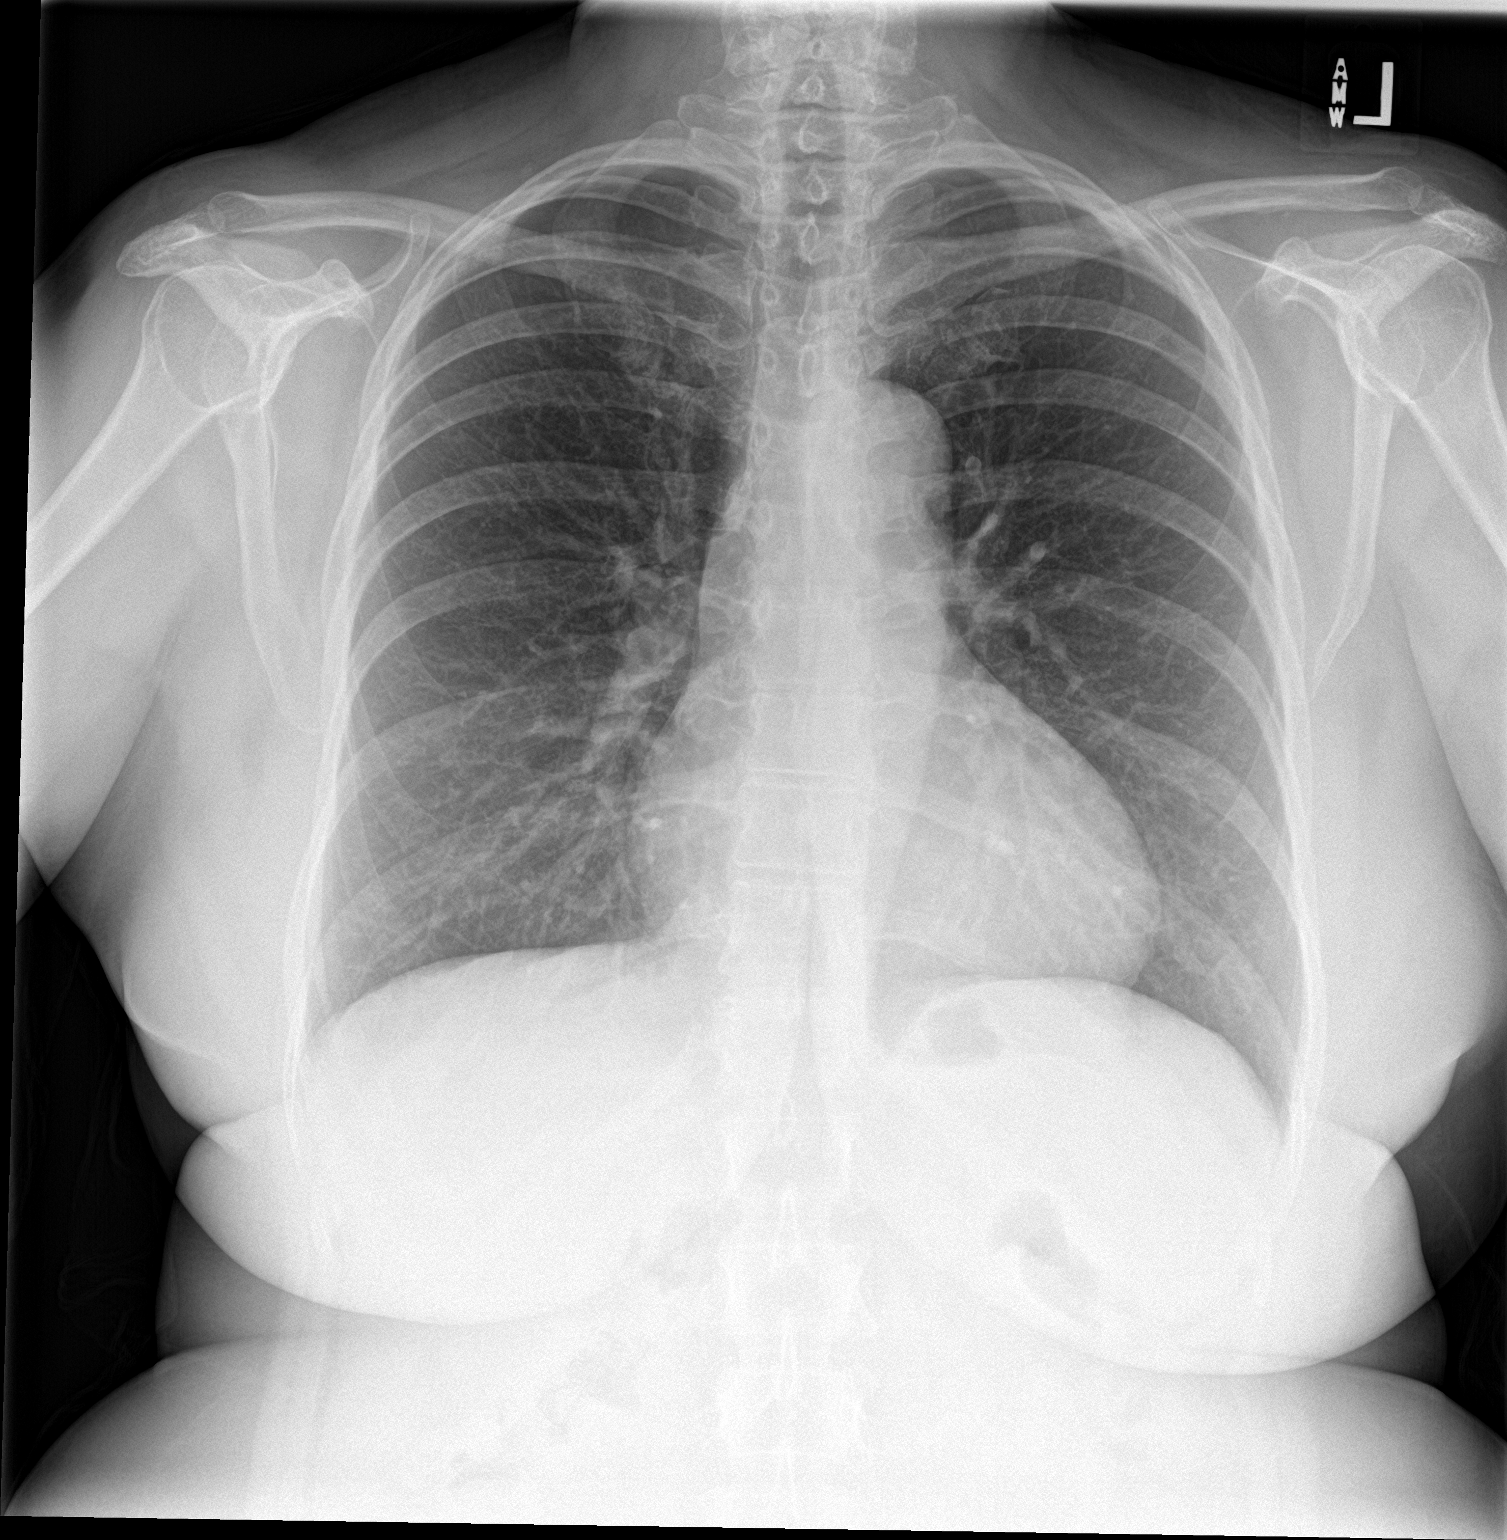

[chest lat]
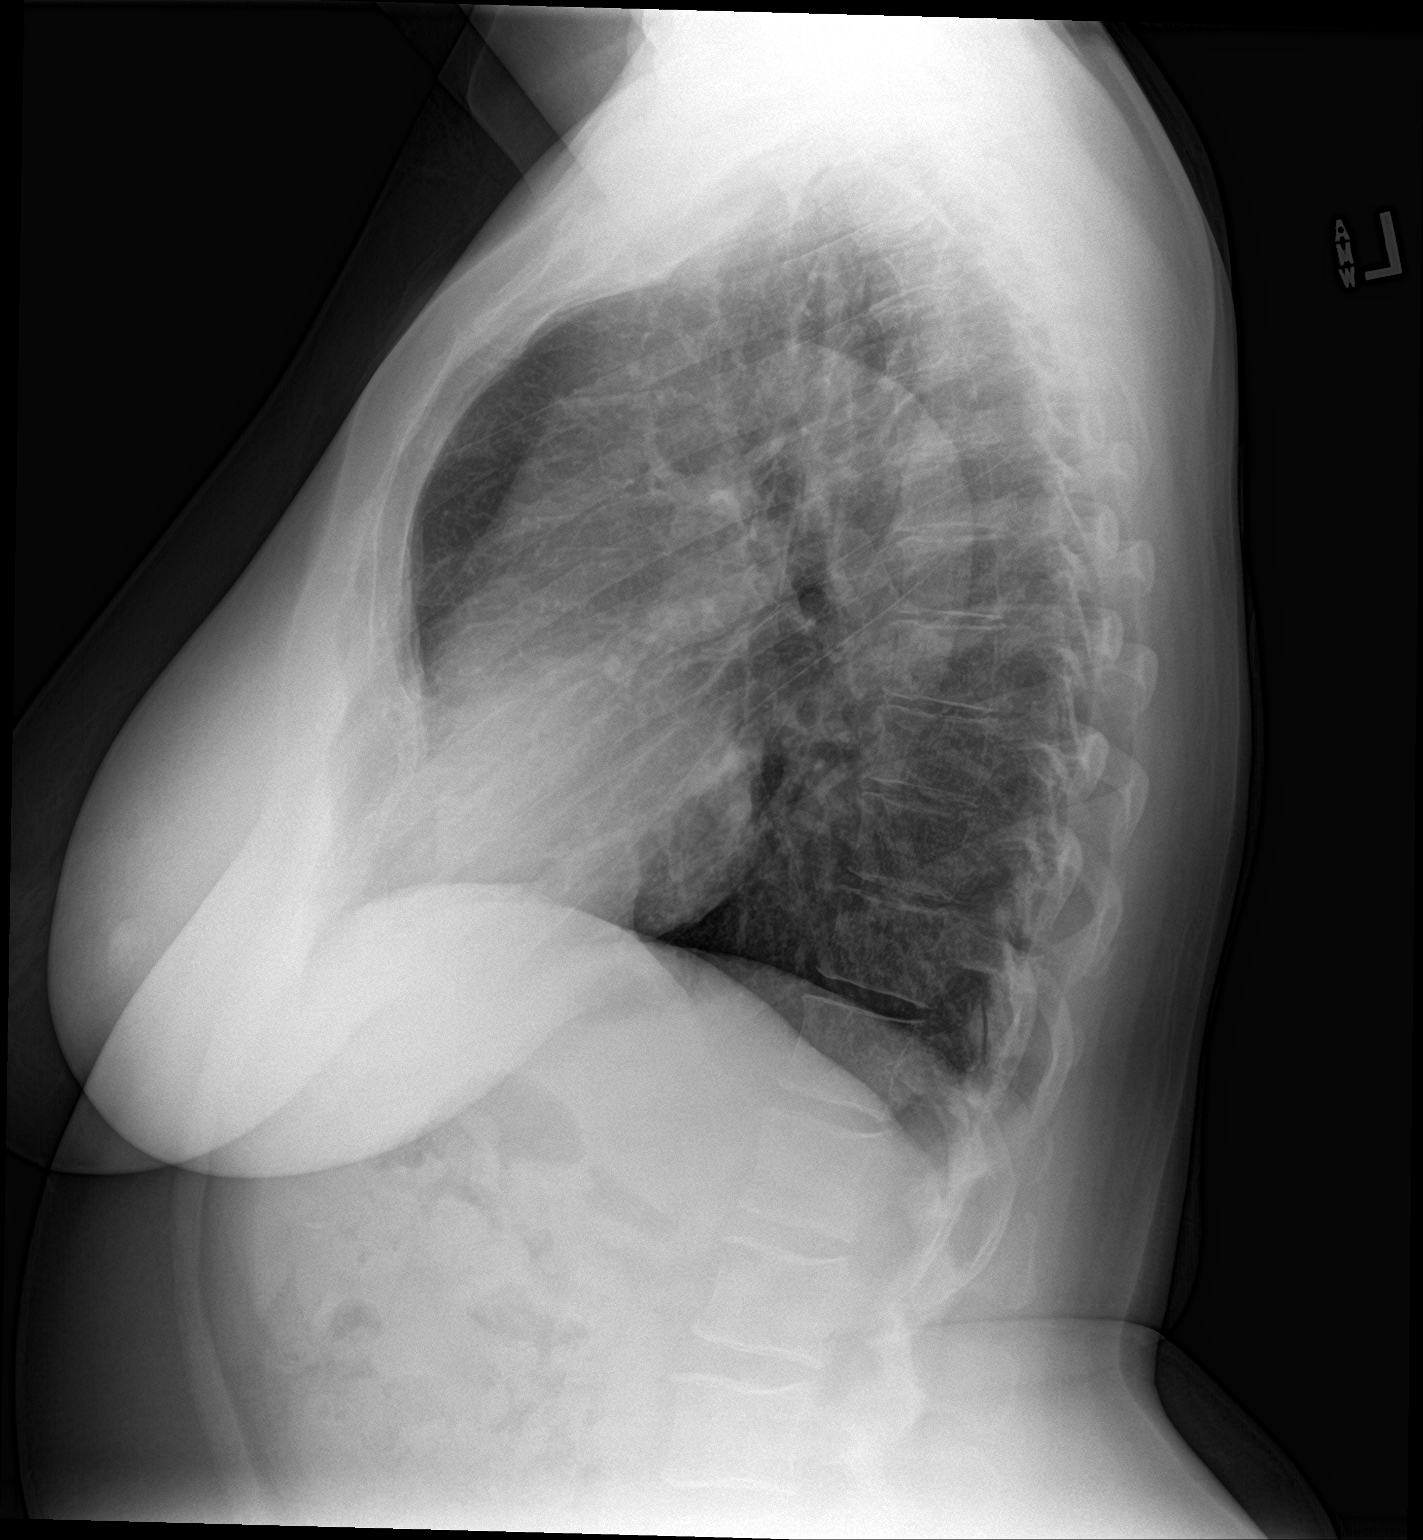

[2 of 2 positions shown; findings below may reference images not displayed]

FINDINGS: The heart size and mediastinal contours are within normal limits.
Both lungs are clear. The visualized skeletal structures are
unremarkable.
IMPRESSION: No active cardiopulmonary disease.

## 2019-11-19 NOTE — Telephone Encounter (Signed)
Call patient plan,not able to initiate plan's "pre-notification" over the phone. They will fax form to office for completion. Please fax to me once received.  Phone# [phone]

## 2019-11-19 NOTE — Telephone Encounter (Signed)
Please apply for otezla per Sherron Ales, PA-C. Thanks!   Consent obtained and sent the scan center. We will also reach out to Tim in regards to coverage program.

## 2019-11-19 NOTE — Patient Instructions (Addendum)
COVID-19 vaccine recommendations:   COVID-19 vaccine is recommended for everyone (unless you are allergic to a vaccine component), even if you are on a medication that suppresses your immune system.   If you are on Methotrexate, Cellcept (mycophenolate), Rinvoq, Harriette Ohara, and Olumiant- hold the medication for 1 week after each vaccine. Hold Methotrexate for 2 weeks after the single dose COVID-19 vaccine.   If you are on Orencia subcutaneous injection - hold medication one week prior to and one week after the first COVID-19 vaccine dose (only).   If you are on Orencia IV infusions- time vaccination administration so that the first COVID-19 vaccination will occur four weeks after the infusion and postpone the subsequent infusion by one week.   If you are on Cyclophosphamide or Rituxan infusions please contact your doctor prior to receiving the COVID-19 vaccine.   Do not take Tylenol or any anti-inflammatory medications (NSAIDs) 24 hours prior to the COVID-19 vaccination.   There is no direct evidence about the efficacy of the COVID-19 vaccine in individuals who are on medications that suppress the immune system.   Even if you are fully vaccinated, and you are on any medications that suppress your immune system, please continue to wear a mask, maintain at least six feet social distance and practice hand hygiene.   If you develop a COVID-19 infection, please contact your PCP or our office to determine if you need antibody infusion.  The booster vaccine is now available for immunocompromised patients. It is advised that if you had Pfizer vaccine you should get [employer] booster.  If you had a Moderna vaccine then you should get a Moderna booster. Johnson and Laural Benes does not have a booster vaccine at this time.  Please see the following web sites for updated information.    https://www.[email or web address].org/Portals/0/Files/COVID-19-Vaccination-Patient-Resources.pdf  https://www.[email or web address].org/About-Us/Newsroom/Press-Releases/ID/1159  Standing Labs We placed an order today for your standing lab work.   Please have your standing labs drawn in January and every 3 months   If possible, please have your labs drawn 2 weeks prior to your appointment so that the provider can discuss your results at your appointment.  We have open lab daily Monday through Thursday from 8:30-12:30 PM and 1:30-4:30 PM and Friday from 8:30-12:30 PM and 1:30-4:00 PM at the office of Dr. Pollyann Savoy, [facility].   Please be advised, patients with office appointments requiring lab work will take precedents over walk-in lab work.  If possible, please come for your lab work on Monday and Friday afternoons, as you may experience shorter wait times. The office is located at [address], [address], [place], [place] [address] No appointment is necessary.   Labs are drawn by [facility]. Please bring your co-pay at the time of your lab draw.  You may receive a bill from [facility] for your lab work.  If you wish to have your labs drawn at another location, please call the office 24 hours in advance to send orders.  If you have any questions regarding directions or hours of operation,  please call [phone].   As a reminder, please drink plenty of water prior to coming for your lab work. Thanks!    Apremilast oral tablets What is this medicine? APREMILAST (a PRE mil ast) is used to treat plaque psoriasis, psoriatic arthritis, and certain oral ulcers. This medicine may be used for other purposes; ask your health care provider or pharmacist if you have questions. COMMON BRAND NAME(S): Henderson Baltimore What should I tell my health care provider before I  take this medicine? They need to know if you have any of these conditions:  dehydration  kidney disease  mental illness  an  unusual or allergic reaction to apremilast, other medicines, foods, dyes, or preservatives  pregnant or trying to get pregnant  breast-feeding How should I use this medicine? Take this medicine by mouth with a glass of water. Follow the directions on the prescription label. Do not cut, crush or chew this medicine. You can take it with or without food. If it upsets your stomach, take it with food. Take your medicine at regular intervals. Do not take it more often than directed. Do not stop taking except on your doctor's advice. Talk to your pediatrician regarding the use of this medicine in children. Special care may be needed. Overdosage: If you think you have taken too much of this medicine contact a poison control center or emergency room at once. NOTE: This medicine is only for you. Do not share this medicine with others. What if I miss a dose? If you miss a dose, take it as soon as you can. If it is almost time for your next dose, take only that dose. Do not take double or extra doses. What may interact with this medicine? This medicine may interact with the following medications:  certain medicines for seizures like carbamazepine, phenobarbital, phenytoin  rifampin This list may not describe all possible interactions. Give your health care provider a list of all the medicines, herbs, non-prescription drugs, or dietary supplements you use. Also tell them if you smoke, drink alcohol, or use illegal drugs. Some items may interact with your medicine. What should I watch for while using this medicine? Tell your doctor or healthcare professional if your symptoms do not start to get better or if they get worse. Patients and their families should watch out for new or worsening depression or thoughts of suicide. Also watch out for sudden changes in feelings such as feeling anxious, agitated, panicky, irritable, hostile, aggressive, impulsive, severely restless, overly excited and hyperactive, or not  being able to sleep. If this happens, call your health care professional. Check with your doctor or health care professional if you get an attack of severe diarrhea, nausea and vomiting, or if you sweat a lot. The loss of too much body fluid can make it dangerous for you to take this medicine. What side effects may I notice from receiving this medicine? Side effects that you should report to your doctor or health care professional as soon as possible:  depressed mood  weight loss Side effects that usually do not require medical attention (report to your doctor or health care professional if they continue or are bothersome):  diarrhea  headache  nausea, vomiting This list may not describe all possible side effects. Call your doctor for medical advice about side effects. You may report side effects to FDA at 1-800-FDA-1088. Where should I keep my medicine? Keep out of the reach of children. Store below 30 degrees C (86 degrees F). Throw away any unused medicine after the expiration date. NOTE: This sheet is a summary. It may not cover all possible information. If you have questions about this medicine, talk to your doctor, pharmacist, or health care provider.  2020 Elsevier/Gold Standard (2017-08-22 12:47:14)

## 2019-11-20 ENCOUNTER — Telehealth: Payer: Self-pay | Admitting: Rheumatology

## 2019-11-20 NOTE — Telephone Encounter (Signed)
Patient had labs drawn October 19, 2019 at [facility]. Patient had requested those results be sent here at that time.  #1. Patient wants to confirm we received those results. If not, call patient to have them sent.  #2. Patient wants to know if she will still needs labs drawn for [place]? Please call to advise.

## 2019-11-20 NOTE — Telephone Encounter (Signed)
Left message to advise patient we did received the lab work from [facility]. Advised at this time I do not see where she will need any additional labs.

## 2019-11-20 NOTE — Telephone Encounter (Signed)
Submitted a Prior Authorization request to [employer] for [employer] via [employer]. Will update once we receive a response.   Fax# [phone]

## 2019-11-23 NOTE — Telephone Encounter (Signed)
Received Fax from [employer] regarding a prior notification for OTEZLA. Authorization has been APPROVED from 11/22/19 for [employer] (Subject to formulary changes).   Phone # [phone]  Ran test claim, patient's copay is $3,851.89 for 1 month supply.  Called plan, they advised the way the plan works is patients pay our of pocket and they submit to be reimbursed for cost-share by [employer] . She could not advise if patient would be reimbursed the full amount or a partial.   Preferred pharmacy [facility].  Patient has commercial plan, so she should be eligible to use a copay card. Unsure, how the copay card would work with a cost share plan scenario.

## 2019-11-26 NOTE — Telephone Encounter (Signed)
I think the patient would benefit from a sample trial of otezla.  Please call the patient and notify her of the plan.   Thank you for all the time and effort trying to figure out the best plan of action for [place].

## 2019-11-26 NOTE — Telephone Encounter (Signed)
I spoke with Frances Watkins, the [place] rep and patient is not eligible for co-pay program because the type of plan she has is legally not considered insurance. Patient is not eligible for patient assistance due to yearly income. One option for the patient would be utilizing sample medication for a trial period to see if the medication would be effective. Please advise.

## 2019-11-26 NOTE — Telephone Encounter (Addendum)
I spoke with patient and advised her of the co-pay for a 1 month supply. I advised patient that we could try a sample trial of Otezla to see if the medication will be effective. Patient verbalized understanding and would like to try the sample trial. Patient states she has been looking at alternatives and other avenues for health insurance. Patient will come by the office tomorrow to pick up a starter pack and sample pack of Otezla. Patient has been advised we can supply the samples on a monthly basis for the "sample trial". Patient verbalized understanding and was very appreciative. Samples have been reserved in the cabinet.

## 2019-11-27 MED ORDER — APREMILAST 30 MG PO TABS: 30.0000 mg | ORAL_TABLET | Freq: Two times a day (BID) | ORAL | 0 refills | Status: DC

## 2019-11-27 MED ORDER — APREMILAST 10 & 20 & 30 MG PO TBPK: ORAL_TABLET | ORAL | 0 refills | Status: DC

## 2019-11-27 NOTE — Telephone Encounter (Signed)
Otezla samples provided to patient today (1 starter pack and 1 maintenance dose pack).   Medication Samples have been provided to the patient.  Drug name: Otezla starter pack      Strength:        Qty: 1  LOT: [id number]  Exp.Date: 01/2021  Dosing instructions: Take as directed on starter pack.    Drug name: Henderson Baltimore    Strength: 30mg      Qty: 1 LOT  Exp.Date: 07/01/2021  Dosing instructions: Take 1 tablet by mouth twice daily after completion of starter pack.    07/03/2021 11:24 AM 11/27/2019   Advised patient she should take otezla in addition to the methotrexate for dual therapy. Patient is now scheduled to follow up on 01/15/2020. (please review and sign the sample rx that are pended)

## 2019-12-17 ENCOUNTER — Telehealth: Payer: Self-pay

## 2019-12-17 NOTE — Telephone Encounter (Signed)
Patient called stating she will run out of Otezla medication at the end of the week and is requesting a return call to let her know when she can stop by the office and pick up more samples.

## 2019-12-19 NOTE — Telephone Encounter (Signed)
Patient advised she may come by the office to pick up samples.  

## 2019-12-20 NOTE — Telephone Encounter (Signed)
Medication Samples have been provided to the patient.  Drug name: Henderson Baltimore   Strength: 30 mg      Qty: 2 LOT: [id number]  Exp.Date: 02/2021  Dosing instructions: Take on tablet by mouth twice daily.   The patient has been instructed regarding the correct time, dose, and frequency of taking this medication, including desired effects and most common side effects.   Dulcy Fanny 3:08 PM 12/20/2019

## 2020-01-02 NOTE — Progress Notes (Signed)
Office Visit Note  Patient: Frances Watkins             Date of Birth: 07/09/1965           MRN: [id number]             PCP: Alinda Deem, MD Referring: Alinda Deem, MD Visit Date: 01/15/2020 Occupation: @GUAROCC @  Subjective:  Pain in joints.   History of Present Illness: Frances Watkins is a 54 y.o. female with history of psoriatic arthritis and osteoarthritis.  She has been on on 44 since Tober 26 2021.  She has been trying the combination therapy of methotrexate and Otezla.  She states she has noticed some gradual improvement on 07-18-1998.  She had a stressful family situation about a week ago and had a flare of her arthritis with pain and swelling in her left knee joint.  The symptoms resolved after 24 hours.  Still have some discomfort in her hands and her ankle joints.  She denies any psoriasis flares.  She has noticed improvement in the SI joints.  The plantar fasciitis is still bothers her off and on.  Left Achilles tendinitis has resolved.  Activities of Daily Living:  Patient reports morning stiffness for less than 1  hour.   Patient Denies nocturnal pain.  Difficulty dressing/grooming: Reports Difficulty climbing stairs: Reports Difficulty getting out of chair: Reports Difficulty using hands for taps, buttons, cutlery, and/or writing: Reports  Review of Systems  Constitutional: Positive for fatigue.  HENT: Negative for mouth sores, mouth dryness and nose dryness.   Eyes: Negative for pain, itching and dryness.  Respiratory: Negative for shortness of breath and difficulty breathing.   Cardiovascular: Negative for chest pain and palpitations.  Gastrointestinal: Negative for blood in stool, constipation and diarrhea.  Endocrine: Negative for increased urination.  Genitourinary: Negative for difficulty urinating.  Musculoskeletal: Positive for arthralgias, joint pain, joint swelling and morning stiffness. Negative for myalgias, muscle tenderness and myalgias.  Skin:  Negative for color change, rash and redness.  Allergic/Immunologic: Negative for susceptible to infections.  Neurological: Negative for dizziness, numbness, headaches, memory loss and weakness.  Hematological: Negative for bruising/bleeding tendency.  Psychiatric/Behavioral: Negative for confusion.    PMFS History:  Patient Active Problem List   Diagnosis Date Noted  . Psoriasis 09/21/2018  . Anxiety 09/21/2018  . Essential hypertension 09/21/2018  . History of kidney stones 09/21/2018    Past Medical History:  Diagnosis Date  . Heart murmur    no treatment  . History of kidney stones   . PONV (postoperative nausea and vomiting)    no problem  03/14/12  . Psoriasis     Family History  Problem Relation Age of Onset  . Arthritis Mother   . Diabetes Father   . Hypertension Father   . High Cholesterol Father   . Heart disease Father   . Heart attack Father   . Gout Father   . Healthy Sister   . Psoriasis Daughter        psoriatic arthritis  . Healthy Daughter    Past Surgical History:  Procedure Laterality Date  . COLONOSCOPY     2014, 2019  . HOLMIUM LASER APPLICATION Right 03/23/2012   Procedure: HOLMIUM LASER APPLICATION;  Surgeon: 03/25/2012, MD;  [facility];  Service: Urology;  Laterality: Right;  . INNER EAR SURGERY  2000  . LITHOTRIPSY     x2  . TONSILLECTOMY     age 77 yrs.  2001  TYMPANOPLASTY W/ MASTOIDECTOMY     x2  . UPPER GI ENDOSCOPY     2014, 2019  . URETEROSCOPY  2014   Social History   Social History Narrative  . Not on file   Immunization History  Administered Date(s) Administered  . PFIZER SARS-COV-2 Vaccination 04/05/2019, 04/24/2019, 10/05/2019  . Pneumococcal Conjugate-13 09/27/2018     Objective: Vital Signs: BP (!) 192/95 (BP Location: Left Arm, Patient Position: Sitting, Cuff Size: Normal)   Pulse (!) 54   Resp 15   Ht 5\' 3"  (1.6 m)   Wt 192 lb 6.4 oz (87.3 kg)   BMI 34.08 kg/m    Physical  Exam Vitals and nursing note reviewed.  Constitutional:      Appearance: She is well-developed and well-nourished.  HENT:     Head: Normocephalic and atraumatic.  Eyes:     Extraocular Movements: EOM normal.     Conjunctiva/sclera: Conjunctivae normal.  Cardiovascular:     Rate and Rhythm: Normal rate and regular rhythm.     Pulses: Intact distal pulses.     Heart sounds: Normal heart sounds.  Pulmonary:     Effort: Pulmonary effort is normal.     Breath sounds: Normal breath sounds.  Abdominal:     General: Bowel sounds are normal.     Palpations: Abdomen is soft.  Musculoskeletal:     Cervical back: Normal range of motion.  Lymphadenopathy:     Cervical: No cervical adenopathy.  Skin:    General: Skin is warm and dry.     Capillary Refill: Capillary refill takes less than 2 seconds.  Neurological:     Mental Status: She is alert and oriented to person, place, and time.  Psychiatric:        Mood and Affect: Mood and affect normal.        Behavior: Behavior normal.      Musculoskeletal Exam: The spine thoracic and lumbar spine with good range of motion.  She had no SI joint tenderness.  Shoulder joints, elbow joints, wrist joints, MCPs PIPs and DIPs with good range of motion.  She has bilateral DIP thickening with no synovitis.  Hip joints and knee joints in good range of motion.  She had no Achilles tendinitis.  She had no tenderness over plantar fascia.  PIP and DIP thickening was noted.  CDAI Exam: CDAI Score: 0.6  Patient Global: 3 mm; Provider Global: 3 mm Swollen: 0 ; Tender: 0  Joint Exam 01/15/2020   No joint exam has been documented for this visit   There is currently no information documented on the homunculus. Go to the Rheumatology activity and complete the homunculus joint exam.  Investigation: No additional findings.  Imaging: No results found.  Recent Labs: Lab Results  Component Value Date   WBC 5.6 05/04/2019   HGB 13.1 05/04/2019   PLT 289  05/04/2019   NA 141 06/19/2019   K 4.1 06/19/2019   CL 106 06/19/2019   CO2 30 06/19/2019   GLUCOSE 93 06/19/2019   BUN 13 06/19/2019   CREATININE 0.72 06/19/2019   BILITOT 0.5 06/19/2019   ALKPHOS 73 01/30/2019   AST 17 06/19/2019   ALT 14 06/19/2019   PROT 6.6 06/19/2019   ALBUMIN 4.5 01/30/2019   CALCIUM 8.9 06/19/2019   GFRAA 112 06/19/2019    Speciality Comments: No specialty comments available.  Procedures:  No procedures performed Allergies: Sulfa drugs cross reactors   Assessment / Plan:     Visit Diagnoses:  Psoriatic arthritis (HCC)-she had no active synovitis on examination today.  She states she had a flare about a week ago when her father was very sick and was in ICU.  She had pain and swelling in her left knee joint which resolved after a day.  She has some stiffness in her joints but no increased swelling.  Psoriasis-she has no active psoriasis lesions.  High risk medication use - Otezla 30 mg 1 tablet by mouth BID.MTX 1.0 ml sq injections once weekly and folic acid 2 mg po daily.  She is getting Otezla samples from our office as she does not have insurance coverage.  We will give her Otezla samples today.  When she gets insurance then she can get [place] prescription.  Her labs from her PCPs office CBC and CMP in September 2021 was normal.  She will get repeat labs with her PCP which should include CBC with differential and CMP with GFR.  She is supposed to get labs every 3 months to monitor for drug toxicity while she is on methotrexate.  Chronic SI joint pain -she had no SI joint tenderness today.  Plantar fasciitis, bilateral-she states that plantar fasciitis flares has become less frequent.  Achilles tendinitis, left leg-resolved.  Essential hypertension-blood pressure is elevated today.  She states she has whitecoat syndrome.  Her blood pressure is normal at home.  Advised her to monitor blood pressure closely and if her blood pressure stays elevated she should  schedule an appointment with the PCP.  History of kidney stones  Anxiety  Solar elastosis  Educated but COVID-19 virus infection-patient is fully vaccinated against COVID-19.  She also received a booster.  Use of mask, social distancing and hand hygiene was discussed.  Orders: Orders Placed This Encounter  Procedures  . CBC with Differential/Platelet  . COMPLETE METABOLIC PANEL WITH GFR   No orders of the defined types were placed in this encounter.    Follow-Up Instructions: Return in about 3 months (around 04/14/2020) for Psoriatic arthritis.   Pollyann Savoy, MD  Note - This record has been created using [employer]ist.  Chart creation errors have been sought, but may not always  have been located. Such creation errors do not reflect on  the standard of medical care.

## 2020-01-07 ENCOUNTER — Telehealth: Payer: Self-pay | Admitting: Rheumatology

## 2020-01-07 NOTE — Telephone Encounter (Signed)
Patient states she was placed on Otezla at her last appointment. Patient has been on the [place] since 11/27/2019. Patient states she feels things have gotten worse. Patient states all of her joints are hurting more than normal. Patient states her left knee is swollen and really sore. Patient has a follow up scheduled for next week. Patient states she states she is limping. Patient would like to know if she continue the [place]. Patient does not recall any injury. Patient has been under increased stressed.  Please advise.

## 2020-01-07 NOTE — Telephone Encounter (Signed)
Patient has some concerns about continuing [place]. Patient experiencing some symptoms, and not sure if they are related to medication. Please call to discuss.

## 2020-01-08 NOTE — Telephone Encounter (Signed)
Called patient to schedule earlier appointment.  Patient states "her knee is feeling much better and the swelling has gone way down."  Patient states she wants to keep her existing appointment on 01/15/20 at 2:15 pm.

## 2020-01-08 NOTE — Telephone Encounter (Signed)
Please schedule sooner follow up this week to further evaluate.

## 2020-01-15 ENCOUNTER — Ambulatory Visit (INDEPENDENT_AMBULATORY_CARE_PROVIDER_SITE_OTHER): Payer: No Typology Code available for payment source | Admitting: Rheumatology

## 2020-01-15 ENCOUNTER — Other Ambulatory Visit: Payer: Self-pay

## 2020-01-15 ENCOUNTER — Encounter: Payer: Self-pay | Admitting: Rheumatology

## 2020-01-15 VITALS — BP 192/95 | HR 54 | Resp 15 | Ht 63.0 in | Wt 192.4 lb

## 2020-01-15 DIAGNOSIS — L409 Psoriasis, unspecified: Secondary | ICD-10-CM

## 2020-01-15 DIAGNOSIS — Z87442 Personal history of urinary calculi: Secondary | ICD-10-CM

## 2020-01-15 DIAGNOSIS — Z79899 Other long term (current) drug therapy: Secondary | ICD-10-CM

## 2020-01-15 DIAGNOSIS — L4050 Arthropathic psoriasis, unspecified: Secondary | ICD-10-CM

## 2020-01-15 DIAGNOSIS — M7662 Achilles tendinitis, left leg: Secondary | ICD-10-CM

## 2020-01-15 DIAGNOSIS — M533 Sacrococcygeal disorders, not elsewhere classified: Secondary | ICD-10-CM | POA: Diagnosis not present

## 2020-01-15 DIAGNOSIS — G8929 Other chronic pain: Secondary | ICD-10-CM

## 2020-01-15 DIAGNOSIS — F419 Anxiety disorder, unspecified: Secondary | ICD-10-CM

## 2020-01-15 DIAGNOSIS — M722 Plantar fascial fibromatosis: Secondary | ICD-10-CM

## 2020-01-15 DIAGNOSIS — I10 Essential (primary) hypertension: Secondary | ICD-10-CM

## 2020-01-15 DIAGNOSIS — L578 Other skin changes due to chronic exposure to nonionizing radiation: Secondary | ICD-10-CM

## 2020-01-15 NOTE — Progress Notes (Signed)
Medication Samples have been provided to the patient.  Drug name: Henderson Baltimore   Strength: 30mg        Qty: 2 boxes LOT:  Exp.Date: 07/01/2021  Dosing instructions: Take 1 tablet by mouth twice daily.   The patient has been instructed regarding the correct time, dose, and frequency of taking this medication, including desired effects and most common side effects.   Tamel Abel C Teyana Pierron 3:13 PM 01/15/2020

## 2020-01-23 ENCOUNTER — Other Ambulatory Visit: Payer: Self-pay | Admitting: *Deleted

## 2020-01-23 DIAGNOSIS — L4050 Arthropathic psoriasis, unspecified: Secondary | ICD-10-CM

## 2020-01-23 MED ORDER — FOLIC ACID 1 MG PO TABS: 2.0000 mg | ORAL_TABLET | Freq: Every day | ORAL | 3 refills | Status: DC

## 2020-01-23 NOTE — Telephone Encounter (Signed)
Last Visit: 01/15/2020 Next Visit: 04/21/2020  Okay to refill per Dr. Corliss Skains

## 2020-01-30 ENCOUNTER — Telehealth: Payer: Self-pay | Admitting: *Deleted

## 2020-01-30 NOTE — Telephone Encounter (Signed)
Labs received from [facility] on 01/18/2020 Reviewed by Sherron Ales, PA-C  Lipid Panel   Non- HDL 137. Rest of panel WNL

## 2020-02-19 ENCOUNTER — Telehealth: Payer: Self-pay | Admitting: Rheumatology

## 2020-02-19 NOTE — Telephone Encounter (Signed)
Patient advised she is able to come by the office to pick up a sample of Frances Watkins.

## 2020-02-19 NOTE — Telephone Encounter (Signed)
Patient left a message stating she is due to pick up a sample this week for [place]. Please call to advise if she is able to.

## 2020-02-21 ENCOUNTER — Telehealth: Payer: Self-pay

## 2020-02-21 NOTE — Telephone Encounter (Signed)
Medication Samples have been provided to the patient.  Drug name: Henderson Baltimore      Strength: 30 mg       Qty: 2 LOT: [id number]  Exp.Date: 07/01/2021  Dosing instructions: Take one tablet by mouth in the morning and at bedtime.

## 2020-02-21 NOTE — Telephone Encounter (Signed)
Patient called stating she will come in this morning (02/21/20) to pick up her sample of Otezla.

## 2020-02-21 NOTE — Telephone Encounter (Signed)
Sample reserved in the cabinet for patient .

## 2020-03-25 ENCOUNTER — Encounter: Payer: Self-pay | Admitting: Cardiology

## 2020-03-25 ENCOUNTER — Encounter: Payer: Self-pay | Admitting: *Deleted

## 2020-03-25 ENCOUNTER — Telehealth: Payer: Self-pay

## 2020-03-25 DIAGNOSIS — K219 Gastro-esophageal reflux disease without esophagitis: Secondary | ICD-10-CM | POA: Insufficient documentation

## 2020-03-25 DIAGNOSIS — R011 Cardiac murmur, unspecified: Secondary | ICD-10-CM | POA: Insufficient documentation

## 2020-03-25 NOTE — Telephone Encounter (Signed)
Patient advised she may come by the office to pick up samples. Samples reserved in the cabinet for patient.

## 2020-03-25 NOTE — Telephone Encounter (Signed)
Patient called stating she will be out of [place] tomorrow and is requesting to come by the office and pick up more samples.  Patient requested a return call.

## 2020-03-26 NOTE — Telephone Encounter (Signed)
Medication Samples have been provided to the patient.  Drug name: [place]     Strength: 30 mg        Qty: 2 LOT: [id number]  Exp.Date: 07/01/2021  Dosing instructions: Take on tablet by mouth twice daily.

## 2020-04-07 NOTE — Progress Notes (Signed)
Office Visit Note  Patient: Frances Watkins             Date of Birth: 1965-02-25           MRN: [id number]             PCP: Greig Right, MD Referring: Greig Right, MD Visit Date: 04/21/2020 Occupation: _0 @  Subjective:  Pain in both feet   History of Present Illness: Frances Watkins is a 55 y.o. female with history of psoriatic arthritis.  Patient is taking Otezla 30 mg 1 tablet by mouth twice daily, methotrexate 1 mL sq injections once weekly, and folic acid 2 mg by mouth daily.  She is tolerating these medications without any side effects.  She denies any recent psoriatic arthritis flares.  She states that she continues to have some discomfort in both ankles and both feet but her discomfort overall has been tolerable.  Her morning stiffness has been lasting about 1 hour which is a significant improvement.  She states that her Achilles tendinitis has gone down significantly.  She denies any joint swelling at this time.  She denies any active psoriasis.  She continues to have occasional SI joint pain bilaterally.  Overall she has felt that combination therapy has been managing her arthritis better than methotrexate as monotherapy.  Patient reports that she recently established care with Dr. Harriet Masson and has been undergoing a thorough work-up for palpitations.  She underwent an echocardiogram on 04/16/2020 and was found to have frequent PVCs with left ventricular thickening.  She is currently wearing a heart monitor for 14 days and will be following up with Dr. Harriet Masson on 05/29/20.  She denies any recent infections.   Activities of Daily Living:  Patient reports morning stiffness for 1 hour.   Patient Denies nocturnal pain.  Difficulty dressing/grooming: Denies Difficulty climbing stairs: Denies Difficulty getting out of chair: Denies Difficulty using hands for taps, buttons, cutlery, and/or writing: Denies  Review of Systems  Constitutional: Positive for fatigue.  HENT: Negative for  mouth sores, mouth dryness and nose dryness.   Eyes: Negative for pain, itching and dryness.  Respiratory: Negative for shortness of breath and difficulty breathing.   Cardiovascular: Positive for palpitations. Negative for chest pain.  Gastrointestinal: Negative for blood in stool, constipation and diarrhea.  Endocrine: Negative for increased urination.  Genitourinary: Negative for difficulty urinating.  Musculoskeletal: Positive for morning stiffness. Negative for arthralgias, joint pain, joint swelling, myalgias, muscle tenderness and myalgias.  Skin: Positive for rash. Negative for color change and redness.  Allergic/Immunologic: Negative for susceptible to infections.  Neurological: Negative for dizziness, numbness, headaches, memory loss and weakness.  Hematological: Negative for bruising/bleeding tendency.  Psychiatric/Behavioral: Negative for confusion.    PMFS History:  Patient Active Problem List   Diagnosis Date Noted  . Palpitations 04/09/2020  . PAF (paroxysmal atrial fibrillation) ([place]) 04/09/2020  . Primary hypertension 04/09/2020  . GERD (gastroesophageal reflux disease)   . Heart murmur   . Psoriasis 09/21/2018  . Anxiety 09/21/2018  . Essential hypertension 09/21/2018  . History of kidney stones 09/21/2018  . Eustachian tube dysfunction 10/05/2011  . History of cholesteatoma 10/05/2011    Past Medical History:  Diagnosis Date  . Anxiety 09/21/2018  . Essential hypertension 09/21/2018  . Eustachian tube dysfunction 10/05/2011  . GERD (gastroesophageal reflux disease)   . Heart murmur    no treatment  . History of cholesteatoma 10/05/2011  . History of kidney stones   . History of stomach ulcers   .  PONV (postoperative nausea and vomiting)    no problem  03/14/12  . Psoriasis   . Tachycardia     Family History  Problem Relation Age of Onset  . Arthritis Mother   . Diabetes Father   . Hypertension Father   . High Cholesterol Father   . Heart disease Father    . Heart attack Father   . Gout Father   . Cirrhosis Father   . Healthy Sister   . Psoriasis Daughter        psoriatic arthritis  . Healthy Daughter   . Lymphoma Maternal Grandmother   . Cancer Paternal Grandfather    Past Surgical History:  Procedure Laterality Date  . COLONOSCOPY     2014, 2019  . HOLMIUM LASER APPLICATION Right 4/76/5465   Procedure: HOLMIUM LASER APPLICATION;  Surgeon: Malka So, MD;  [facility];  Service: Urology;  Laterality: Right;  . INNER EAR SURGERY  2000  . LITHOTRIPSY     x2  . TONSILLECTOMY     age 19 yrs.  . TYMPANOPLASTY W/ MASTOIDECTOMY     x2  . UPPER GI ENDOSCOPY     2014, 2019  . URETEROSCOPY  2014   Social History   Social History Narrative  . Not on file   Immunization History  Administered Date(s) Administered  . PFIZER(Purple Top)SARS-COV-2 Vaccination 04/05/2019, 04/24/2019, 10/05/2019  . Pneumococcal Conjugate-13 09/27/2018     Objective: Vital Signs: BP 129/85 (BP Location: Left Arm, Patient Position: Sitting, Cuff Size: Normal)   Pulse (!) 55   Resp 13   Ht 5' 3" (1.6 m)   Wt 169 lb 9.6 oz (76.9 kg)   BMI 30.04 kg/m    Physical Exam Vitals and nursing note reviewed.  Constitutional:      Appearance: She is well-developed.  HENT:     Head: Normocephalic and atraumatic.  Eyes:     Conjunctiva/sclera: Conjunctivae normal.  Pulmonary:     Effort: Pulmonary effort is normal.  Abdominal:     Palpations: Abdomen is soft.  Musculoskeletal:     Cervical back: Normal range of motion.  Skin:    General: Skin is warm and dry.     Capillary Refill: Capillary refill takes less than 2 seconds.  Neurological:     Mental Status: She is alert and oriented to person, place, and time.  Psychiatric:        Behavior: Behavior normal.      Musculoskeletal Exam: C-spine, thoracic spine, lumbar spine have good range of motion.  Tenderness to palpation over bilateral SI joints and midline spinal  tenderness in the lumbar region.  Shoulder joints, elbow joints, wrist joints, MCPs, PIPs, DIPs have good range of motion with no synovitis.  She is able to make a complete fist bilaterally.  Hip joints have good range of motion with no discomfort.  Knee joints have good range of motion with no warmth or effusion.  Ankle joints have good range of motion with no tenderness or inflammation.  No Achilles tendinitis.  Tenderness along the plantar fascial bilaterally.  No tenderness of MTP joints.  Some tenderness palpation over the left trochanteric bursa.  CDAI Exam: CDAI Score: - Patient Global: -; Provider Global: - Swollen: 0 ; Tender: 2  Joint Exam 04/21/2020      Right  Left  Sacroiliac   Tender   Tender     Investigation: No additional findings.  Imaging: ECHOCARDIOGRAM COMPLETE  Result Date: 04/16/2020  ECHOCARDIOGRAM REPORT   Patient Name:   LINDSEA OLIVAR Date of Exam: 04/16/2020 Medical Rec #:  [id number]     Height:       63.0 in Accession #:    [id number]    Weight:       173.0 lb Date of Birth:  May 02, 1965     BSA:          1.818 m Patient Age:    75 years      BP:           124/98 mmHg Patient Gender: F             HR:           54 bpm. Exam Location:  [place] Procedure: 2D Echo Indications:    PAF (paroxysmal atrial fibrillation) (HCC) [I48.0 (ICD-10-CM)]  History:        Patient has no prior history of Echocardiogram examinations.                 Arrythmias:Palpitations, Signs/Symptoms:Murmur; Risk                 Factors:Hypertension.  Sonographer:    Luane School Referring Phys: [id number] [place]  1. Frequent PVC's. Left ventricular ejection fraction, by estimation, is 60 to 65%. The left ventricle has normal function. The left ventricle has no regional wall motion abnormalities. There is mild concentric left ventricular hypertrophy. Left ventricular diastolic parameters were normal.  2. Right ventricular systolic function is normal. The right ventricular size is normal.  There is normal pulmonary artery systolic pressure.  3. The mitral valve is normal in structure. No evidence of mitral valve regurgitation. No evidence of mitral stenosis.  4. The aortic valve is tricuspid. Aortic valve regurgitation is not visualized. No aortic stenosis is present.  5. The inferior vena cava is normal in size with greater than 50% respiratory variability, suggesting right atrial pressure of 3 mmHg. FINDINGS  Left Ventricle: Frequent PVC's. Left ventricular ejection fraction, by estimation, is 60 to 65%. The left ventricle has normal function. The left ventricle has no regional wall motion abnormalities. The left ventricular internal cavity size was normal in size. There is mild concentric left ventricular hypertrophy. Left ventricular diastolic parameters were normal. Normal left ventricular filling pressure. Right Ventricle: The right ventricular size is normal. No increase in right ventricular wall thickness. Right ventricular systolic function is normal. There is normal pulmonary artery systolic pressure. The tricuspid regurgitant velocity is 2.16 m/s, and  with an assumed right atrial pressure of 3 mmHg, the estimated right ventricular systolic pressure is 03.4 mmHg. Left Atrium: Left atrial size was normal in size. Right Atrium: Right atrial size was normal in size. Pericardium: There is no evidence of pericardial effusion. Mitral Valve: The mitral valve is normal in structure. No evidence of mitral valve regurgitation. No evidence of mitral valve stenosis. Tricuspid Valve: The tricuspid valve is normal in structure. Tricuspid valve regurgitation is trivial. No evidence of tricuspid stenosis. Aortic Valve: The aortic valve is tricuspid. Aortic valve regurgitation is not visualized. No aortic stenosis is present. Pulmonic Valve: The pulmonic valve was normal in structure. Pulmonic valve regurgitation is not visualized. No evidence of pulmonic stenosis. Aorta: The aortic root and ascending aorta  are structurally normal, with no evidence of dilitation and the ascending aorta was not well visualized. Venous: The inferior vena cava is normal in size with greater than 50% respiratory variability, suggesting right atrial pressure of 3 mmHg. IAS/Shunts: No  atrial level shunt detected by color flow Doppler.  LEFT VENTRICLE PLAX 2D LVIDd:         5.20 cm  Diastology LVIDs:         2.90 cm  LV e' medial:    7.40 cm/s LV PW:         1.20 cm  LV E/e' medial:  12.2 LV IVS:        1.30 cm  LV e' lateral:   9.14 cm/s LVOT diam:     1.90 cm  LV E/e' lateral: 9.8 LV SV:         74 LV SV Index:   41       2D Longitudinal Strain LVOT Area:     2.84 cm 2D Strain GLS Avg:     -10.3 %  RIGHT VENTRICLE             IVC RV S prime:     15.10 cm/s  IVC diam: 1.70 cm TAPSE (M-mode): 3.5 cm LEFT ATRIUM             Index       RIGHT ATRIUM           Index LA diam:        3.20 cm 1.76 cm/m  RA Area:     10.90 cm LA Vol (A2C):   36.9 ml 20.30 ml/m RA Volume:   21.50 ml  11.83 ml/m LA Vol (A4C):   48.7 ml 26.79 ml/m LA Biplane Vol: 45.3 ml 24.92 ml/m  AORTIC VALVE LVOT Vmax:   107.00 cm/s LVOT Vmean:  70.300 cm/s LVOT VTI:    0.261 m  AORTA Ao Root diam: 2.50 cm Ao Asc diam:  3.50 cm Ao Desc diam: 2.30 cm MITRAL VALVE               TRICUSPID VALVE MV Area (PHT): 3.26 cm    TR Peak grad:   18.7 mmHg MV Decel Time: 233 msec    TR Vmax:        216.00 cm/s MV E velocity: 90.00 cm/s MV A velocity: 78.80 cm/s  SHUNTS MV E/A ratio:  1.14        Systemic VTI:  0.26 m                            Systemic Diam: 1.90 cm Shirlee More MD Electronically signed by Shirlee More MD Signature Date/Time: 04/16/2020/12:50:10 PM    Final     Recent Labs: Lab Results  Component Value Date   WBC 5.6 05/04/2019   HGB 13.1 05/04/2019   PLT 289 05/04/2019   NA 141 06/19/2019   K 4.1 06/19/2019   CL 106 06/19/2019   CO2 30 06/19/2019   GLUCOSE 93 06/19/2019   BUN 13 06/19/2019   CREATININE 0.72 06/19/2019   BILITOT 0.5 06/19/2019   ALKPHOS  73 01/30/2019   AST 17 06/19/2019   ALT 14 06/19/2019   PROT 6.6 06/19/2019   ALBUMIN 4.5 01/30/2019   CALCIUM 8.9 06/19/2019   GFRAA 112 06/19/2019    Speciality Comments: No specialty comments available.  Procedures:  No procedures performed Allergies: Sulfa drugs cross reactors   Assessment / Plan:     Visit Diagnoses: Psoriatic arthritis ([place]): She has no synovitis or dactylitis on exam.  Her symptoms have improved significantly on combination therapy.  She is currently taking Otezla 30 mg 1 tablet by mouth twice daily,  methotrexate 1.0 mL sq injections once weekly, and folic acid 2 mg by mouth daily.  She has been tolerating these medications without any side effects and has not had any recent infections.  She continues to experience some pain and stiffness in both ankles and both feet but overall her discomfort has improved significantly. No evidence of achilles tendonitis.  She continues to have some symptoms of plantar fasciitis first thing in the morning.  She has occasional SI joint discomfort and has tenderness to palpation on examination today.  She has not developed any new patches of psoriasis.  She has noticed a significant improvement on combination therapy.  She would like to try reducing the dose of methotrexate from 1.0 ml to 0.8 ml sq injections once weekly.  She will continue on otezla as prescribed. A sample of otezla was provided to the patient. She was advised to notify us if she develops increased joint pain or joint swelling.  She will follow-up in the office in 5 months.  Psoriasis: She has 1 small patch of chronic psoriasis behind her left ear.  No new patches of psoriasis.  High risk medication use - Otezla 30 mg 1 tablet by mouth twice daily, Methotrexate 0.8 ml sq injections once weekly and folic acid 2 mg po daily. CBC, lipid panel, and CMP are drawn on 04/16/2020.  LFTs within normal limits.  Creatinine and GFR within normal limits.  Her next lab work will be due  in June and every 3 months to monitor for drug toxicity.  Standing orders for CBC and CMP remain in place. She has not had any recent infections.  She was advised to hold methotrexate if she develops signs or symptoms of an infection and to resume once the infection has completely cleared.  She has received 3 pfizer covid-19 vaccine doses. She would be eligible to receive the 4th dose at any time. Advised to hold MTX for 1 week after the 4th vaccine dose.   - Plan: CMP14+EGFR  Chronic SI joint pain: She has tenderness over both SI joints.  She continues to experience intermittent discomfort in both SI joints.  She was advised to notify us if she starts developing increased stiffness or nocturnal pain.  Plantar fasciitis, bilateral: She continues to have intermittent symptoms of plantar fasciitis.  Her discomfort has improved significantly.  She has mild tenderness palpation on examination today.  Achilles tendinitis, left leg - Resolved.  Essential hypertension: BP was 129/85 today in the office.  PVC's (premature ventricular contractions): Followed by Dr. Harriet Masson.  She had an echocardiogram performed on 04/16/2020 which revealed mild left ventricular thickening.  Ejection fraction has been preserved.  She is currently wearing a Holter monitor.  She will be following up with Dr. Harriet Masson on 05/29/2020.  Other medical conditions are listed as follows:   History of kidney stones  Solar elastosis  Anxiety  Orders: Orders Placed This Encounter  Procedures  . CMP14+EGFR   No orders of the defined types were placed in this encounter.    Follow-Up Instructions: Return in about 5 months (around 09/21/2020) for Psoriatic arthritis.   Ofilia Neas, PA-C  Note - This record has been created using Dragon software.  Chart creation errors have been sought, but may not always  have been located. Such creation errors do not reflect on  the standard of medical care.

## 2020-04-09 ENCOUNTER — Encounter: Payer: Self-pay | Admitting: Cardiology

## 2020-04-09 ENCOUNTER — Ambulatory Visit (INDEPENDENT_AMBULATORY_CARE_PROVIDER_SITE_OTHER): Payer: No Typology Code available for payment source | Admitting: Cardiology

## 2020-04-09 ENCOUNTER — Other Ambulatory Visit: Payer: Self-pay

## 2020-04-09 ENCOUNTER — Ambulatory Visit (INDEPENDENT_AMBULATORY_CARE_PROVIDER_SITE_OTHER): Payer: No Typology Code available for payment source

## 2020-04-09 VITALS — BP 124/98 | HR 58 | Ht 63.0 in | Wt 173.0 lb

## 2020-04-09 DIAGNOSIS — R002 Palpitations: Secondary | ICD-10-CM

## 2020-04-09 DIAGNOSIS — I10 Essential (primary) hypertension: Secondary | ICD-10-CM

## 2020-04-09 DIAGNOSIS — I480 Paroxysmal atrial fibrillation: Secondary | ICD-10-CM

## 2020-04-09 HISTORY — DX: Paroxysmal atrial fibrillation: I48.0

## 2020-04-09 HISTORY — DX: Palpitations: R00.2

## 2020-04-09 HISTORY — DX: Essential (primary) hypertension: I10

## 2020-04-09 MED ORDER — HYDROCHLOROTHIAZIDE 12.5 MG PO CAPS: 12.5000 mg | ORAL_CAPSULE | Freq: Every day | ORAL | 3 refills | Status: DC

## 2020-04-09 MED ORDER — ATENOLOL 25 MG PO TABS: 25.0000 mg | ORAL_TABLET | Freq: Every day | ORAL | 3 refills | Status: DC

## 2020-04-09 NOTE — Progress Notes (Signed)
Cardiology Office Note:    Date:  04/09/2020   ID:  Frances Watkins, DOB [birth year], MRN [id number]  PCP:  Alinda Deem, MD  Cardiologist:  Thomasene Ripple, DO  Electrophysiologist:  None   Referring MD: Karilyn Cota, NP  Have been having fluttering of my heart  History of Present Illness:    Frances Watkins is a 55 y.o. female with a hx of psoriatic arthritis, osteoarthritis, hypertension, is here today at the request of her PCP to be evaluated for palpitations.  Patient tells me that over the last several months mostly since 2019 she has been experiencing episodes of palpitations.  She notes that her heart feels erratic and during this time she feels lightheaded.  Last for minutes at a time and then resolved.  She notes that her most recent episode she was with her husband sitting down when she fell her heartbeat going significantly fast she is felt lightheaded.  She took her husband's apple watch and worried she does have the paper with her here today which showed that actually the patient was in atrial fibrillation.  She mentioned that it went for a few minutes and then stopped.  She has not had this episode since.  Past Medical History:  Diagnosis Date  . Anxiety 09/21/2018  . Essential hypertension 09/21/2018  . Eustachian tube dysfunction 10/05/2011  . GERD (gastroesophageal reflux disease)   . Heart murmur    no treatment  . History of cholesteatoma 10/05/2011  . History of kidney stones   . History of stomach ulcers   . PONV (postoperative nausea and vomiting)    no problem  03/14/12  . Psoriasis   . Tachycardia     Past Surgical History:  Procedure Laterality Date  . COLONOSCOPY     2014, 2019  . HOLMIUM LASER APPLICATION Right 03/23/2012   Procedure: HOLMIUM LASER APPLICATION;  Surgeon: Anner Crete, MD;  Location: [facility];  Service: Urology;  Laterality: Right;  . INNER EAR SURGERY  2000  . LITHOTRIPSY     x2  . TONSILLECTOMY     age 20 yrs.  .  TYMPANOPLASTY W/ MASTOIDECTOMY     x2  . UPPER GI ENDOSCOPY     2014, 2019  . URETEROSCOPY  2014    Current Medications: Current Meds  Medication Sig  . Apremilast 30 MG TABS Take 1 tablet (30 mg total) by mouth in the morning and at bedtime. After completion of starter pack.  Marland Kitchen atenolol (TENORMIN) 25 MG tablet Take 1 tablet (25 mg total) by mouth daily.  . Chorionic Gonadotropin (HCG IJ) Inject as directed 2 (two) times a week.  . escitalopram (LEXAPRO) 10 MG tablet Take 10 mg by mouth daily.  . folic acid (FOLVITE) 1 MG tablet Take 2 tablets (2 mg total) by mouth daily.  . hydrochlorothiazide (MICROZIDE) 12.5 MG capsule Take 1 capsule (12.5 mg total) by mouth daily.  Marland Kitchen levonorgestrel (MIRENA) 20 MCG/24HR IUD 1 each by Intrauterine route once.  . Liraglutide -Weight Management (SAXENDA [place]) Inject 1.8 mLs into the skin daily.  . methotrexate 50 MG/2ML injection inject 1 mL under the skin once weekly.  . Multiple Vitamin (MULTIVITAMIN WITH MINERALS) TABS tablet Take 1 tablet by mouth daily.  Marland Kitchen olmesartan (BENICAR) 40 MG tablet Take 40 mg by mouth daily.  . TUBERCULIN SYR 1CC/27GX1/2" (B-D TB SYRINGE 1CC/27GX1/2") 27G X 1/2" 1 ML MISC 12 Syringes by Does not apply route once a week.  . [  DISCONTINUED] atenolol (TENORMIN) 50 MG tablet Take 50 mg by mouth daily.     Allergies:   Sulfa drugs cross reactors   Social History   Socioeconomic History  . Marital status: Married    Spouse name: Not on file  . Number of children: Not on file  . Years of education: Not on file  . Highest education level: Not on file  Occupational History  . Not on file  Tobacco Use  . Smoking status: Never Smoker  . Smokeless tobacco: Never Used  Vaping Use  . Vaping Use: Never used  Substance and Sexual Activity  . Alcohol use: Yes    Comment: occasional  . Drug use: No  . Sexual activity: Not on file  Other Topics Concern  . Not on file  Social History Narrative  . Not on file   Social  Determinants of Health   Financial Resource Strain: Not on file  Food Insecurity: Not on file  Transportation Needs: Not on file  Physical Activity: Not on file  Stress: Not on file  Social Connections: Not on file     Family History: The patient's family history includes Arthritis in her mother; Cancer in her paternal grandfather; Diabetes in her father; Gout in her father; Healthy in her daughter and sister; Heart attack in her father; Heart disease in her father; High Cholesterol in her father; Hypertension in her father; Lymphoma in her maternal grandmother; Psoriasis in her daughter.  ROS:   Review of Systems  Constitution: Negative for decreased appetite, fever and weight gain.  HENT: Negative for congestion, ear discharge, hoarse voice and sore throat.   Eyes: Negative for discharge, redness, vision loss in right eye and visual halos.  Cardiovascular: Negative for chest pain, dyspnea on exertion, leg swelling, orthopnea and palpitations.  Respiratory: Negative for cough, hemoptysis, shortness of breath and snoring.   Endocrine: Negative for heat intolerance and polyphagia.  Hematologic/Lymphatic: Negative for bleeding problem. Does not bruise/bleed easily.  Skin: Negative for flushing, nail changes, rash and suspicious lesions.  Musculoskeletal: Negative for arthritis, joint pain, muscle cramps, myalgias, neck pain and stiffness.  Gastrointestinal: Negative for abdominal pain, bowel incontinence, diarrhea and excessive appetite.  Genitourinary: Negative for decreased libido, genital sores and incomplete emptying.  Neurological: Negative for brief paralysis, focal weakness, headaches and loss of balance.  Psychiatric/Behavioral: Negative for altered mental status, depression and suicidal ideas.  Allergic/Immunologic: Negative for HIV exposure and persistent infections.    EKGs/Labs/Other Studies Reviewed:    The following studies were reviewed today:   EKG:  The ekg ordered  today demonstrates sinus bradycardia, heart rate 88 bpm.  Recent Labs: 05/04/2019: Hemoglobin 13.1; Platelets 289 06/19/2019: ALT 14; BUN 13; Creat 0.72; Potassium 4.1; Sodium 141  Recent Lipid Panel No results found for: CHOL, TRIG, HDL, CHOLHDL, VLDL, LDLCALC, LDLDIRECT  Physical Exam:    VS:  BP (!) 124/98 (BP Location: Right Arm)   Pulse (!) 58   Ht 5\' 3"  (1.6 m)   Wt 173 lb (78.5 kg)   SpO2 98%   BMI 30.65 kg/m     Wt Readings from Last 3 Encounters:  04/09/20 173 lb (78.5 kg)  03/18/20 177 lb (80.3 kg)  01/15/20 192 lb 6.4 oz (87.3 kg)     GEN: Well nourished, well developed in no acute distress HEENT: Normal NECK: No JVD; No carotid bruits LYMPHATICS: No lymphadenopathy CARDIAC: S1S2 noted,RRR, no murmurs, rubs, gallops RESPIRATORY:  Clear to auscultation without rales, wheezing or rhonchi  ABDOMEN: Soft, non-tender, non-distended, +bowel sounds, no guarding. EXTREMITIES: No edema, No cyanosis, no clubbing MUSCULOSKELETAL:  No deformity  SKIN: Warm and dry NEUROLOGIC:  Alert and oriented x 3, non-focal PSYCHIATRIC:  Normal affect, good insight  ASSESSMENT:    1. Palpitations   2. PAF (paroxysmal atrial fibrillation) (HCC)   3. Primary hypertension    PLAN:    She does have a paper recording of when she reports to be her reading of her husband's apple watch and at that time she is in atrial fibrillation with rapid ventricular rate.  We will like to do is place a monitor and patient understand more about this.  If indeed that is her report we will need to discuss rhythm control rate control and stroke prevention.  For now CHA2DS2-VASc was 2 we will continue to monitor.  Discussed with the patient we may consider anticoagulation depending on the results from her ZIO monitor. In the meantime she is hypertensive for diastolic hypertension today were going to cut back on the atenolol as she is bradycardic as well and add hydrochlorothiazide to her current  regimen.  Echocardiogram will also be done to assess LV/RV function and any other structure abnormalities.  The patient is in agreement with the above plan. The patient left the office in stable condition.  The patient will follow up in 6 weeks or sooner if needed.   Medication Adjustments/Labs and Tests Ordered: Current medicines are reviewed at length with the patient today.  Concerns regarding medicines are outlined above.  Orders Placed This Encounter  Procedures  . LONG TERM MONITOR (3-14 DAYS)  . EKG 12-Lead  . ECHOCARDIOGRAM COMPLETE   Meds ordered this encounter  Medications  . atenolol (TENORMIN) 25 MG tablet    Sig: Take 1 tablet (25 mg total) by mouth daily.    Dispense:  90 tablet    Refill:  3  . hydrochlorothiazide (MICROZIDE) 12.5 MG capsule    Sig: Take 1 capsule (12.5 mg total) by mouth daily.    Dispense:  90 capsule    Refill:  3    Patient Instructions   Medication Instructions:  Your physician has recommended you make the following change in your medication: DECREASE: Atenolol 25 mg daily START: Hydrochlorothiazide 12.5 mg daily *If you need a refill on your cardiac medications before your next appointment, please call your pharmacy*   Lab Work: None If you have labs (blood work) drawn today and your tests are completely normal, you will receive your results only by: Marland Kitchen MyChart Message (if you have MyChart) OR . A paper copy in the mail If you have any lab test that is abnormal or we need to change your treatment, we will call you to review the results.   Testing/Procedures: Your physician has requested that you have an echocardiogram. Echocardiography is a painless test that uses sound waves to create images of your heart. It provides your doctor with information about the size and shape of your heart and how well your heart's chambers and valves are working. This procedure takes approximately one hour. There are no restrictions for this  procedure.  A zio monitor was ordered today. It will remain on for 14 days. You will then return monitor and event diary in provided box. It takes 1-2 weeks for report to be downloaded and returned to [place]. We will call you with the results. If monitor falls off or has orange flashing light, please call Zio for further instructions.  Follow-Up: At [facility], you and your health needs are our priority.  As part of our continuing mission to provide you with exceptional heart care, we have created designated Provider Care Teams.  These Care Teams include your primary Cardiologist (physician) and Advanced Practice Providers (APPs -  Physician Assistants and Nurse Practitioners) who all work together to provide you with the care you need, when you need it.  We recommend signing up for the patient portal called "MyChart".  Sign up information is provided on this After Visit Summary.  MyChart is used to connect with patients for Virtual Visits (Telemedicine).  Patients are able to view lab/test results, encounter notes, upcoming appointments, etc.  Non-urgent messages can be sent to your provider as well.   To learn more about what you can do with MyChart, go to [email or web address].    Your next appointment:   6 week(s)  The format for your next appointment:   In Person  Provider:   Thomasene Ripple, DO   Other Instructions  Echocardiogram An echocardiogram is a test that uses sound waves (ultrasound) to produce images of the heart. Images from an echocardiogram can provide important information about:  Heart size and shape.  The size and thickness and movement of your heart's walls.  Heart muscle function and strength.  Heart valve function or if you have stenosis. Stenosis is when the heart valves are too narrow.  If blood is flowing backward through the heart valves (regurgitation).  A tumor or infectious growth around the heart valves.  Areas of heart muscle that are not  working well because of poor blood flow or injury from a heart attack.  Aneurysm detection. An aneurysm is a weak or damaged part of an artery wall. The wall bulges out from the normal force of blood pumping through the body. Tell a health care provider about:  Any allergies you have.  All medicines you are taking, including vitamins, herbs, eye drops, creams, and over-the-counter medicines.  Any blood disorders you have.  Any surgeries you have had.  Any medical conditions you have.  Whether you are pregnant or may be pregnant. What are the risks? Generally, this is a safe test. However, problems may occur, including an allergic reaction to dye (contrast) that may be used during the test. What happens before the test? No specific preparation is needed. You may eat and drink normally. What happens during the test?  You will take off your clothes from the waist up and put on a hospital gown.  Electrodes or electrocardiogram (ECG)patches may be placed on your chest. The electrodes or patches are then connected to a device that monitors your heart rate and rhythm.  You will lie down on a table for an ultrasound exam. A gel will be applied to your chest to help sound waves pass through your skin.  A handheld device, called a transducer, will be pressed against your chest and moved over your heart. The transducer produces sound waves that travel to your heart and bounce back (or "echo" back) to the transducer. These sound waves will be captured in real-time and changed into images of your heart that can be viewed on a video monitor. The images will be recorded on a computer and reviewed by your health care provider.  You may be asked to change positions or hold your breath for a short time. This makes it easier to get different views or better views of your heart.  In some cases, you may  receive contrast through an IV in one of your veins. This can improve the quality of the pictures from  your heart. The procedure may vary among health care providers and hospitals.   What can I expect after the test? You may return to your normal, everyday life, including diet, activities, and medicines, unless your health care provider tells you not to do that. Follow these instructions at home:  It is up to you to get the results of your test. Ask your health care provider, or the department that is doing the test, when your results will be ready.  Keep all follow-up visits. This is important. Summary  An echocardiogram is a test that uses sound waves (ultrasound) to produce images of the heart.  Images from an echocardiogram can provide important information about the size and shape of your heart, heart muscle function, heart valve function, and other possible heart problems.  You do not need to do anything to prepare before this test. You may eat and drink normally.  After the echocardiogram is completed, you may return to your normal, everyday life, unless your health care provider tells you not to do that. This information is not intended to replace advice given to you by your health care provider. Make sure you discuss any questions you have with your health care provider. Document Revised: 09/11/2019 Document Reviewed: 09/11/2019 Elsevier Patient Education  2021 Elsevier Inc.      Adopting a Healthy Lifestyle.  Know what a healthy weight is for you (roughly BMI <25) and aim to maintain this   Aim for 7+ servings of fruits and vegetables daily   65-80+ fluid ounces of water or unsweet tea for healthy kidneys   Limit to max 1 drink of alcohol per day; avoid smoking/tobacco   Limit animal fats in diet for cholesterol and heart health - choose grass fed whenever available   Avoid highly processed foods, and foods high in saturated/trans fats   Aim for low stress - take time to unwind and care for your mental health   Aim for 150 min of moderate intensity exercise weekly  for heart health, and weights twice weekly for bone health   Aim for 7-9 hours of sleep daily   When it comes to diets, agreement about the perfect plan isnt easy to find, even among the experts. Experts at the [facility] [employer] developed an idea known as the Healthy Eating Plate. Just imagine a plate divided into logical, healthy portions.   The emphasis is on diet quality:   Load up on vegetables and fruits - one-half of your plate: Aim for color and variety, and remember that potatoes dont count.   Go for whole grains - one-quarter of your plate: Whole wheat, barley, wheat berries, quinoa, oats, brown rice, and foods made with them. If you want pasta, go with whole wheat pasta.   Protein power - one-quarter of your plate: Fish, chicken, beans, and nuts are all healthy, versatile protein sources. Limit red meat.   The diet, however, does go beyond the plate, offering a few other suggestions.   Use healthy plant oils, such as olive, canola, soy, corn, sunflower and peanut. Check the labels, and avoid partially hydrogenated oil, which have unhealthy trans fats.   If youre thirsty, drink water. Coffee and tea are good in moderation, but skip sugary drinks and limit milk and dairy products to one or two daily servings.   The type of carbohydrate in the diet  is more important than the amount. Some sources of carbohydrates, such as vegetables, fruits, whole grains, and beans-are healthier than others.   Finally, stay active  Signed, Thomasene RippleKardie Franca Stakes, DO  04/09/2020 9:40 AM    [facility]

## 2020-04-09 NOTE — Patient Instructions (Signed)
Medication Instructions:  Your physician has recommended you make the following change in your medication: DECREASE: Atenolol 25 mg daily START: Hydrochlorothiazide 12.5 mg daily *If you need a refill on your cardiac medications before your next appointment, please call your pharmacy*   Lab Work: None If you have labs (blood work) drawn today and your tests are completely normal, you will receive your results only by: Marland Kitchen MyChart Message (if you have MyChart) OR . A paper copy in the mail If you have any lab test that is abnormal or we need to change your treatment, we will call you to review the results.   Testing/Procedures: Your physician has requested that you have an echocardiogram. Echocardiography is a painless test that uses sound waves to create images of your heart. It provides your doctor with information about the size and shape of your heart and how well your heart's chambers and valves are working. This procedure takes approximately one hour. There are no restrictions for this procedure.  A zio monitor was ordered today. It will remain on for 14 days. You will then return monitor and event diary in provided box. It takes 1-2 weeks for report to be downloaded and returned to [place]. We will call you with the results. If monitor falls off or has orange flashing light, please call Zio for further instructions.     Follow-Up: At [facility], you and your health needs are our priority.  As part of our continuing mission to provide you with exceptional heart care, we have created designated Provider Care Teams.  These Care Teams include your primary Cardiologist (physician) and Advanced Practice Providers (APPs -  Physician Assistants and Nurse Practitioners) who all work together to provide you with the care you need, when you need it.  We recommend signing up for the patient portal called "MyChart".  Sign up information is provided on this After Visit Summary.  MyChart is used to  connect with patients for Virtual Visits (Telemedicine).  Patients are able to view lab/test results, encounter notes, upcoming appointments, etc.  Non-urgent messages can be sent to your provider as well.   To learn more about what you can do with MyChart, go to [email or web address].    Your next appointment:   6 week(s)  The format for your next appointment:   In Person  Provider:   Thomasene Ripple, DO   Other Instructions  Echocardiogram An echocardiogram is a test that uses sound waves (ultrasound) to produce images of the heart. Images from an echocardiogram can provide important information about:  Heart size and shape.  The size and thickness and movement of your heart's walls.  Heart muscle function and strength.  Heart valve function or if you have stenosis. Stenosis is when the heart valves are too narrow.  If blood is flowing backward through the heart valves (regurgitation).  A tumor or infectious growth around the heart valves.  Areas of heart muscle that are not working well because of poor blood flow or injury from a heart attack.  Aneurysm detection. An aneurysm is a weak or damaged part of an artery wall. The wall bulges out from the normal force of blood pumping through the body. Tell a health care provider about:  Any allergies you have.  All medicines you are taking, including vitamins, herbs, eye drops, creams, and over-the-counter medicines.  Any blood disorders you have.  Any surgeries you have had.  Any medical conditions you have.  Whether you are pregnant or may  be pregnant. What are the risks? Generally, this is a safe test. However, problems may occur, including an allergic reaction to dye (contrast) that may be used during the test. What happens before the test? No specific preparation is needed. You may eat and drink normally. What happens during the test?  You will take off your clothes from the waist up and put on a hospital  gown.  Electrodes or electrocardiogram (ECG)patches may be placed on your chest. The electrodes or patches are then connected to a device that monitors your heart rate and rhythm.  You will lie down on a table for an ultrasound exam. A gel will be applied to your chest to help sound waves pass through your skin.  A handheld device, called a transducer, will be pressed against your chest and moved over your heart. The transducer produces sound waves that travel to your heart and bounce back (or "echo" back) to the transducer. These sound waves will be captured in real-time and changed into images of your heart that can be viewed on a video monitor. The images will be recorded on a computer and reviewed by your health care provider.  You may be asked to change positions or hold your breath for a short time. This makes it easier to get different views or better views of your heart.  In some cases, you may receive contrast through an IV in one of your veins. This can improve the quality of the pictures from your heart. The procedure may vary among health care providers and hospitals.   What can I expect after the test? You may return to your normal, everyday life, including diet, activities, and medicines, unless your health care provider tells you not to do that. Follow these instructions at home:  It is up to you to get the results of your test. Ask your health care provider, or the department that is doing the test, when your results will be ready.  Keep all follow-up visits. This is important. Summary  An echocardiogram is a test that uses sound waves (ultrasound) to produce images of the heart.  Images from an echocardiogram can provide important information about the size and shape of your heart, heart muscle function, heart valve function, and other possible heart problems.  You do not need to do anything to prepare before this test. You may eat and drink normally.  After the  echocardiogram is completed, you may return to your normal, everyday life, unless your health care provider tells you not to do that. This information is not intended to replace advice given to you by your health care provider. Make sure you discuss any questions you have with your health care provider. Document Revised: 09/11/2019 Document Reviewed: 09/11/2019 Elsevier Patient Education  2021 [employer].

## 2020-04-16 ENCOUNTER — Other Ambulatory Visit: Payer: Self-pay

## 2020-04-16 ENCOUNTER — Ambulatory Visit (INDEPENDENT_AMBULATORY_CARE_PROVIDER_SITE_OTHER): Payer: No Typology Code available for payment source

## 2020-04-16 DIAGNOSIS — I480 Paroxysmal atrial fibrillation: Secondary | ICD-10-CM

## 2020-04-16 LAB — ECHOCARDIOGRAM COMPLETE
Area-P 1/2: 3.26 cm2
S' Lateral: 2.9 cm

## 2020-04-16 NOTE — Progress Notes (Signed)
Complete echocardiogram performed.  Jimmy Janelle Culton RDCS, RVT  

## 2020-04-17 ENCOUNTER — Telehealth: Payer: Self-pay

## 2020-04-17 NOTE — Telephone Encounter (Signed)
Spoke with patient regarding results and recommendation.  Patient verbalizes understanding and is agreeable to plan of care. Advised patient to call back with any issues or concerns.  

## 2020-04-17 NOTE — Telephone Encounter (Signed)
-----   Message from Thomasene Ripple, DO sent at 04/17/2020  3:26 PM EDT ----- Your echo showed that your left ventricle wall is slightly thickened.  This usually happened in the setting of hypertension.  Otherwise normal echo.

## 2020-04-21 ENCOUNTER — Encounter: Payer: Self-pay | Admitting: Physician Assistant

## 2020-04-21 ENCOUNTER — Ambulatory Visit (INDEPENDENT_AMBULATORY_CARE_PROVIDER_SITE_OTHER): Payer: No Typology Code available for payment source | Admitting: Physician Assistant

## 2020-04-21 ENCOUNTER — Other Ambulatory Visit: Payer: Self-pay

## 2020-04-21 ENCOUNTER — Ambulatory Visit: Payer: No Typology Code available for payment source | Admitting: Rheumatology

## 2020-04-21 VITALS — BP 129/85 | HR 55 | Resp 13 | Ht 63.0 in | Wt 169.6 lb

## 2020-04-21 DIAGNOSIS — L409 Psoriasis, unspecified: Secondary | ICD-10-CM

## 2020-04-21 DIAGNOSIS — M533 Sacrococcygeal disorders, not elsewhere classified: Secondary | ICD-10-CM

## 2020-04-21 DIAGNOSIS — L578 Other skin changes due to chronic exposure to nonionizing radiation: Secondary | ICD-10-CM

## 2020-04-21 DIAGNOSIS — Z79899 Other long term (current) drug therapy: Secondary | ICD-10-CM | POA: Diagnosis not present

## 2020-04-21 DIAGNOSIS — M7662 Achilles tendinitis, left leg: Secondary | ICD-10-CM

## 2020-04-21 DIAGNOSIS — F419 Anxiety disorder, unspecified: Secondary | ICD-10-CM

## 2020-04-21 DIAGNOSIS — L4050 Arthropathic psoriasis, unspecified: Secondary | ICD-10-CM | POA: Diagnosis not present

## 2020-04-21 DIAGNOSIS — G8929 Other chronic pain: Secondary | ICD-10-CM

## 2020-04-21 DIAGNOSIS — Z87442 Personal history of urinary calculi: Secondary | ICD-10-CM

## 2020-04-21 DIAGNOSIS — I10 Essential (primary) hypertension: Secondary | ICD-10-CM

## 2020-04-21 DIAGNOSIS — I493 Ventricular premature depolarization: Secondary | ICD-10-CM

## 2020-04-21 DIAGNOSIS — M722 Plantar fascial fibromatosis: Secondary | ICD-10-CM

## 2020-04-21 NOTE — Patient Instructions (Signed)
Standing Labs We placed an order today for your standing lab work.   Please have your standing labs drawn in June and every 3 months   If possible, please have your labs drawn 2 weeks prior to your appointment so that the provider can discuss your results at your appointment.  We have open lab daily Monday through Thursday from 1:30-4:30 PM and Friday from 1:30-4:00 PM at the office of Dr. Shaili Deveshwar, [facility].   Please be advised, all patients with office appointments requiring lab work will take precedents over walk-in lab work.  If possible, please come for your lab work on Monday and Friday afternoons, as you may experience shorter wait times. The office is located at [address], [address], [place], [place] [address] No appointment is necessary.   Labs are drawn by [facility]. Please bring your co-pay at the time of your lab draw.  You may receive a bill from [facility] for your lab work.  If you wish to have your labs drawn at another location, please call the office 24 hours in advance to send orders.  If you have any questions regarding directions or hours of operation,  please call [phone].   As a reminder, please drink plenty of water prior to coming for your lab work. Thanks!   

## 2020-04-21 NOTE — Progress Notes (Signed)
Medication Samples have been provided to the patient.  Drug name: [place]    Strength: 30mg        Qty: 2 boxes  LOT:  Exp.Date: 07/01/2021  Dosing instructions: Take 1 tablet by mouth twice daily.

## 2020-04-22 ENCOUNTER — Telehealth: Payer: Self-pay | Admitting: *Deleted

## 2020-04-22 NOTE — Telephone Encounter (Signed)
Labs received from: [facility] Drawn on: 04/16/2020 Reviewed by:Hazel Sams, PA-C  Labs drawn:Lipid panel, CBC w/Diff, CMP, eGFR  Results: neutrophils 77.9

## 2020-05-09 ENCOUNTER — Ambulatory Visit: Payer: No Typology Code available for payment source | Admitting: Cardiology

## 2020-05-14 ENCOUNTER — Other Ambulatory Visit: Payer: Self-pay | Admitting: *Deleted

## 2020-05-14 DIAGNOSIS — L4050 Arthropathic psoriasis, unspecified: Secondary | ICD-10-CM

## 2020-05-14 MED ORDER — METHOTREXATE SODIUM CHEMO INJECTION 50 MG/2ML
INTRAMUSCULAR | 0 refills | Status: DC
Start: 2020-05-14 — End: 2020-10-23

## 2020-05-14 NOTE — Telephone Encounter (Signed)
RX faxed from [facility]  Next Visit: 09/23/2020  Last Visit: 04/22/2020  Last Fill: 10/31/2019  DX:  Psoriatic arthritis   Current Dose per office note 04/21/2020, Methotrexate 0.8 ml sq injections once weekly  Labs: Labs received from: [facility] on: 04/16/2020 Reviewed by:Hazel Sams, PA-C  Labs drawn:Lipid panel, CBC w/Diff, CMP, eGFR  Results: neutrophils 77.9   Okay to refill MTX?

## 2020-05-26 DIAGNOSIS — R112 Nausea with vomiting, unspecified: Secondary | ICD-10-CM | POA: Insufficient documentation

## 2020-05-26 DIAGNOSIS — R000 Tachycardia, unspecified: Secondary | ICD-10-CM | POA: Insufficient documentation

## 2020-05-26 DIAGNOSIS — Z9889 Other specified postprocedural states: Secondary | ICD-10-CM | POA: Insufficient documentation

## 2020-05-26 DIAGNOSIS — Z8711 Personal history of peptic ulcer disease: Secondary | ICD-10-CM | POA: Insufficient documentation

## 2020-05-29 ENCOUNTER — Encounter: Payer: Self-pay | Admitting: Cardiology

## 2020-05-29 ENCOUNTER — Other Ambulatory Visit: Payer: Self-pay

## 2020-05-29 ENCOUNTER — Ambulatory Visit (INDEPENDENT_AMBULATORY_CARE_PROVIDER_SITE_OTHER): Payer: No Typology Code available for payment source | Admitting: Cardiology

## 2020-05-29 VITALS — BP 110/62 | HR 60 | Ht 64.0 in | Wt 165.4 lb

## 2020-05-29 DIAGNOSIS — I10 Essential (primary) hypertension: Secondary | ICD-10-CM | POA: Diagnosis not present

## 2020-05-29 DIAGNOSIS — I471 Supraventricular tachycardia: Secondary | ICD-10-CM | POA: Diagnosis not present

## 2020-05-29 DIAGNOSIS — I493 Ventricular premature depolarization: Secondary | ICD-10-CM | POA: Insufficient documentation

## 2020-05-29 MED ORDER — METOPROLOL SUCCINATE ER 25 MG PO TB24: 25.0000 mg | ORAL_TABLET | Freq: Every day | ORAL | 3 refills | Status: DC

## 2020-05-29 NOTE — Progress Notes (Signed)
Cardiology Office Note:    Date:  05/29/2020   ID:  Frances Watkins, DOB 1965-08-08, MRN [id number]  PCP:  Karilyn Cota, NP  Cardiologist:  Thomasene Ripple, DO  Electrophysiologist:  None   Referring MD: Alinda Deem, MD   " I am still having palpitations"  History of Present Illness:    Frances Watkins is a 55 y.o. female with a hx of psoriatic arthritis, osteoarthritis, hypertension, AVNRT, symptomatic PVCs seen on ZIO monitor is here today for follow-up visit.  I saw the patient for the first time on April 09, 2020 at which time she was concerned given the fact that she had her husband apple watch reported atrial fibrillation and rapid ventricular rate.  She did have some prescription for this.  I placed a monitor on the patient.  She did wear a monitor and she is here today to discuss the result of her monitor as well as her echocardiogram.  Past Medical History:  Diagnosis Date  . Anxiety 09/21/2018  . Essential hypertension 09/21/2018  . Eustachian tube dysfunction 10/05/2011  . GERD (gastroesophageal reflux disease)   . Heart murmur    no treatment  . History of cholesteatoma 10/05/2011  . History of kidney stones   . History of stomach ulcers   . PAF (paroxysmal atrial fibrillation) (HCC) 04/09/2020  . Palpitations 04/09/2020  . PONV (postoperative nausea and vomiting)    no problem  03/14/12  . Primary hypertension 04/09/2020  . Psoriasis   . Tachycardia     Past Surgical History:  Procedure Laterality Date  . COLONOSCOPY     2014, 2019  . HOLMIUM LASER APPLICATION Right 03/23/2012   Procedure: HOLMIUM LASER APPLICATION;  Surgeon: Anner Crete, MD;  Location: [facility];  Service: Urology;  Laterality: Right;  . INNER EAR SURGERY  2000  . LITHOTRIPSY     x2  . TONSILLECTOMY     age 33 yrs.  . TYMPANOPLASTY W/ MASTOIDECTOMY     x2  . UPPER GI ENDOSCOPY     2014, 2019  . URETEROSCOPY  2014    Current Medications: Current Meds  Medication Sig  .  Apremilast 30 MG TABS Take 1 tablet (30 mg total) by mouth in the morning and at bedtime. After completion of starter pack.  . Chorionic Gonadotropin (HCG IJ) Inject as directed 2 (two) times a week.  . escitalopram (LEXAPRO) 10 MG tablet Take 10 mg by mouth daily.  . folic acid (FOLVITE) 1 MG tablet Take 2 tablets (2 mg total) by mouth daily.  . hydrochlorothiazide (MICROZIDE) 12.5 MG capsule Take 1 capsule (12.5 mg total) by mouth daily.  Marland Kitchen levonorgestrel (MIRENA) 20 MCG/24HR IUD 1 each by Intrauterine route once.  . Liraglutide -Weight Management (SAXENDA [place]) Inject 1.8 mLs into the skin daily.  . methotrexate 50 MG/2ML injection Inject 0.8 mL under the skin once weekly.  . metoprolol succinate (TOPROL XL) 25 MG 24 hr tablet Take 1 tablet (25 mg total) by mouth daily.  . Multiple Vitamin (MULTIVITAMIN WITH MINERALS) TABS tablet Take 1 tablet by mouth daily.  Marland Kitchen olmesartan (BENICAR) 40 MG tablet Take 40 mg by mouth daily.  . TUBERCULIN SYR 1CC/27GX1/2" (B-D TB SYRINGE 1CC/27GX1/2") 27G X 1/2" 1 ML MISC 12 Syringes by Does not apply route once a week.  . [DISCONTINUED] atenolol (TENORMIN) 25 MG tablet Take 1 tablet (25 mg total) by mouth daily.     Allergies:   Sulfa drugs cross reactors  Social History   Socioeconomic History  . Marital status: Married    Spouse name: Not on file  . Number of children: Not on file  . Years of education: Not on file  . Highest education level: Not on file  Occupational History  . Not on file  Tobacco Use  . Smoking status: Never Smoker  . Smokeless tobacco: Never Used  Vaping Use  . Vaping Use: Never used  Substance and Sexual Activity  . Alcohol use: Yes    Comment: occasional  . Drug use: No  . Sexual activity: Not on file  Other Topics Concern  . Not on file  Social History Narrative  . Not on file   Social Determinants of Health   Financial Resource Strain: Not on file  Food Insecurity: Not on file  Transportation Needs: Not on file   Physical Activity: Not on file  Stress: Not on file  Social Connections: Not on file     Family History: The patient's family history includes Arthritis in her mother; Cancer in her paternal grandfather; Cirrhosis in her father; Diabetes in her father; Gout in her father; Healthy in her daughter and sister; Heart attack in her father; Heart disease in her father; High Cholesterol in her father; Hypertension in her father; Lymphoma in her maternal grandmother; Psoriasis in her daughter.  ROS:   Review of Systems  Constitution: Negative for decreased appetite, fever and weight gain.  HENT: Negative for congestion, ear discharge, hoarse voice and sore throat.   Eyes: Negative for discharge, redness, vision loss in right eye and visual halos.  Cardiovascular: Negative for chest pain, dyspnea on exertion, leg swelling, orthopnea and palpitations.  Respiratory: Negative for cough, hemoptysis, shortness of breath and snoring.   Endocrine: Negative for heat intolerance and polyphagia.  Hematologic/Lymphatic: Negative for bleeding problem. Does not bruise/bleed easily.  Skin: Negative for flushing, nail changes, rash and suspicious lesions.  Musculoskeletal: Negative for arthritis, joint pain, muscle cramps, myalgias, neck pain and stiffness.  Gastrointestinal: Negative for abdominal pain, bowel incontinence, diarrhea and excessive appetite.  Genitourinary: Negative for decreased libido, genital sores and incomplete emptying.  Neurological: Negative for brief paralysis, focal weakness, headaches and loss of balance.  Psychiatric/Behavioral: Negative for altered mental status, depression and suicidal ideas.  Allergic/Immunologic: Negative for HIV exposure and persistent infections.    EKGs/Labs/Other Studies Reviewed:    The following studies were reviewed today:   EKG: None today  Transthoracic echocardiogram April 16, 2020 IMPRESSIONS  1. Frequent PVC's. Left ventricular ejection  fraction, by estimation, is  60 to 65%. The left ventricle has normal function. The left ventricle has  no regional wall motion abnormalities. There is mild concentric left  ventricular hypertrophy. Left  ventricular diastolic parameters were normal.  2. Right ventricular systolic function is normal. The right ventricular  size is normal. There is normal pulmonary artery systolic pressure.  3. The mitral valve is normal in structure. No evidence of mitral valve  regurgitation. No evidence of mitral stenosis.  4. The aortic valve is tricuspid. Aortic valve regurgitation is not  visualized. No aortic stenosis is present.  5. The inferior vena cava is normal in size with greater than 50%  respiratory variability, suggesting right atrial pressure of 3 mmHg.   FINDINGS  Left Ventricle: Frequent PVC's. Left ventricular ejection fraction, by  estimation, is 60 to 65%. The left ventricle has normal function. The left  ventricle has no regional wall motion abnormalities. The left ventricular  internal cavity  size was normal  in size. There is mild concentric left ventricular hypertrophy. Left  ventricular diastolic parameters were normal. Normal left ventricular  filling pressure.   Right Ventricle: The right ventricular size is normal. No increase in  right ventricular wall thickness. Right ventricular systolic function is  normal. There is normal pulmonary artery systolic pressure. The tricuspid  regurgitant velocity is 2.16 m/s, and  with an assumed right atrial pressure of 3 mmHg, the estimated right  ventricular systolic pressure is 21.7 mmHg.   Left Atrium: Left atrial size was normal in size.   Right Atrium: Right atrial size was normal in size.   Pericardium: There is no evidence of pericardial effusion.   Mitral Valve: The mitral valve is normal in structure. No evidence of  mitral valve regurgitation. No evidence of mitral valve stenosis.   Tricuspid Valve: The tricuspid  valve is normal in structure. Tricuspid  valve regurgitation is trivial. No evidence of tricuspid stenosis.   Aortic Valve: The aortic valve is tricuspid. Aortic valve regurgitation is  not visualized. No aortic stenosis is present.   Pulmonic Valve: The pulmonic valve was normal in structure. Pulmonic valve  regurgitation is not visualized. No evidence of pulmonic stenosis.   Aorta: The aortic root and ascending aorta are structurally normal, with  no evidence of dilitation and the ascending aorta was not well visualized.   Venous: The inferior vena cava is normal in size with greater than 50%  respiratory variability, suggesting right atrial pressure of 3 mmHg.   IAS/Shunts: No atrial level shunt detected by color flow Doppler.    ZIO monitor The patient wore the monitor for 13 days 22 hours study April 09, 2020.   Indication: Palpitations  The minimum heart rate was 38 bpm, maximum heart rate was 203 bpm, and average heart rate was 60 bpm. Predominant underlying rhythm was Sinus Rhythm.  23 Supraventricular Tachycardia runs occurred, the run with the fastest interval lasting 12 beats with a maximum rate of 203 bpm, the longest lasting 16.7 secs with an average rate of 146 bpm.    Premature atrial complexes were rare less than 1% Premature Ventricular complexes were rare less than 1%  No atrial tachycardia, no pauses, No AV block and no atrial fibrillation present.  24 patient triggered events 6 associated with supraventricular tachycardia, the also associated with premature ventricular complex.  21 diary events 5 associated with supraventricular tachycardia in the others associated with premature ventricular and premature atrial complex   Conclusion: This study is is remarkable for:            1.  Paroxysmal symptomatic supraventricular tachycardia (one episode that appear to be AVNRT with multiple others appears to be atrial tachycardia).            2.  Rare symptomatic  premature ventricular complex.   Recent Labs: 06/19/2019: ALT 14; BUN 13; Creat 0.72; Potassium 4.1; Sodium 141  Recent Lipid Panel No results found for: CHOL, TRIG, HDL, CHOLHDL, VLDL, LDLCALC, LDLDIRECT  Physical Exam:    VS:  BP 110/62   Pulse 60   Ht 5\' 4"  (1.626 m)   Wt 165 lb 6.4 oz (75 kg)   SpO2 98%   BMI 28.39 kg/m     Wt Readings from Last 3 Encounters:  05/29/20 165 lb 6.4 oz (75 kg)  04/21/20 169 lb 9.6 oz (76.9 kg)  04/09/20 173 lb (78.5 kg)     GEN: Well nourished, well developed in no acute  distress HEENT: Normal NECK: No JVD; No carotid bruits LYMPHATICS: No lymphadenopathy CARDIAC: S1S2 noted,RRR, no murmurs, rubs, gallops RESPIRATORY:  Clear to auscultation without rales, wheezing or rhonchi  ABDOMEN: Soft, non-tender, non-distended, +bowel sounds, no guarding. EXTREMITIES: No edema, No cyanosis, no clubbing MUSCULOSKELETAL:  No deformity  SKIN: Warm and dry NEUROLOGIC:  Alert and oriented x 3, non-focal PSYCHIATRIC:  Normal affect, good insight  ASSESSMENT:    1. Symptomatic PVCs   2. AVNRT (AV nodal re-entry tachycardia) (HCC)   3. Hypertension, unspecified type    PLAN:     Today is her birthday and I did wish the patient happy birthday.  I explained to the patient about the findings of her ZIO monitor which show evidence of AVNRT as well as symptomatic PVCs.  There were no evidence of atrial fibrillation.  We still will continue to monitor and make sure that atrial fibrillation is not a factor here.  For now given the fact that we are not able to prove atrial fibrillation no anticoagulation will be started.  I stopped the atenolol.  Start the patient on metoprolol succinate 25 mg daily.  Her blood pressure deceptively in the office today.  The patient is in agreement with the above plan. The patient left the office in stable condition.  The patient will follow up in 6 weeks due to medication change.   Medication Adjustments/Labs and Tests  Ordered: Current medicines are reviewed at length with the patient today.  Concerns regarding medicines are outlined above.  No orders of the defined types were placed in this encounter.  Meds ordered this encounter  Medications  . metoprolol succinate (TOPROL XL) 25 MG 24 hr tablet    Sig: Take 1 tablet (25 mg total) by mouth daily.    Dispense:  90 tablet    Refill:  3    Patient Instructions  Medication Instructions:  Your physician has recommended you make the following change in your medication:   Stop Atenolol Start Metoprolol succinate 25 mg daily.  *If you need a refill on your cardiac medications before your next appointment, please call your pharmacy*   Lab Work: None ordered If you have labs (blood work) drawn today and your tests are completely normal, you will receive your results only by: Marland Kitchen. MyChart Message (if you have MyChart) OR . A paper copy in the mail If you have any lab test that is abnormal or we need to change your treatment, we will call you to review the results.   Testing/Procedures: None ordered   Follow-Up: At [facility], you and your health needs are our priority.  As part of our continuing mission to provide you with exceptional heart care, we have created designated Provider Care Teams.  These Care Teams include your primary Cardiologist (physician) and Advanced Practice Providers (APPs -  Physician Assistants and Nurse Practitioners) who all work together to provide you with the care you need, when you need it.  We recommend signing up for the patient portal called "MyChart".  Sign up information is provided on this After Visit Summary.  MyChart is used to connect with patients for Virtual Visits (Telemedicine).  Patients are able to view lab/test results, encounter notes, upcoming appointments, etc.  Non-urgent messages can be sent to your provider as well.   To learn more about what you can do with MyChart, go to [email or web address].     Your next appointment:   6 week(s)  The format for your next appointment:  In Person  Provider:   Thomasene Ripple, DO   Other Instructions Metoprolol Extended-Release Tablets What is this medicine? METOPROLOL (me TOE proe lole) is a beta blocker. It decreases the amount of work your heart has to do and helps your heart beat regularly. It treats high blood pressure and/or prevent chest pain (also called angina). It also treats heart failure. This medicine may be used for other purposes; ask your health care provider or pharmacist if you have questions. COMMON BRAND NAME(S): toprol, Toprol XL What should I tell my health care provider before I take this medicine? They need to know if you have any of these conditions:  diabetes  heart or vessel disease like slow heart rate, worsening heart failure, heart block, sick sinus syndrome or Raynaud's disease  kidney disease  liver disease  lung or breathing disease, like asthma or emphysema  pheochromocytoma  thyroid disease  an unusual or allergic reaction to metoprolol, other beta-blockers, medicines, foods, dyes, or preservatives  pregnant or trying to get pregnant  breast-feeding How should I use this medicine? Take this drug by mouth. Take it as directed on the prescription label at the same time every day. Take it with food. You may cut the tablet in half if it is scored (has a line in the middle of it). This may help you swallow the tablet if the whole tablet is too big. Be sure to take both halves. Do not take just one-half of the tablet. Keep taking it unless your health care provider tells you to stop. Talk to your health care provider about the use of this drug in children. While it may be prescribed for children as young as 6 for selected conditions, precautions do apply. Overdosage: If you think you have taken too much of this medicine contact a poison control center or emergency room at once. NOTE: This medicine is only  for you. Do not share this medicine with others. What if I miss a dose? If you miss a dose, take it as soon as you can. If it is almost time for your next dose, take only that dose. Do not take double or extra doses. What may interact with this medicine? This medicine may interact with the following medications:  certain medicines for blood pressure, heart disease, irregular heart beat  certain medicines for depression, like monoamine oxidase (MAO) inhibitors, fluoxetine, or paroxetine  clonidine  dobutamine  epinephrine  isoproterenol  reserpine This list may not describe all possible interactions. Give your health care provider a list of all the medicines, herbs, non-prescription drugs, or dietary supplements you use. Also tell them if you smoke, drink alcohol, or use illegal drugs. Some items may interact with your medicine. What should I watch for while using this medicine? Visit your doctor or health care professional for regular check ups. Contact your doctor right away if your symptoms worsen. Check your blood pressure and pulse rate regularly. Ask your health care professional what your blood pressure and pulse rate should be, and when you should contact them. You may get drowsy or dizzy. Do not drive, use machinery, or do anything that needs mental alertness until you know how this medicine affects you. Do not sit or stand up quickly, especially if you are an older patient. This reduces the risk of dizzy or fainting spells. Contact your doctor if these symptoms continue. Alcohol may interfere with the effect of this medicine. Avoid alcoholic drinks. This medicine may increase blood sugar. Ask your healthcare  provider if changes in diet or medicines are needed if you have diabetes. What side effects may I notice from receiving this medicine? Side effects that you should report to your doctor or health care professional as soon as possible:  allergic reactions like skin rash,  itching or hives  cold or numb hands or feet  depression  difficulty breathing  faint  fever with sore throat  irregular heartbeat, chest pain  rapid weight gain  signs and symptoms of high blood sugar such as being more thirsty or hungry or having to urinate more than normal. You may also feel very tired or have blurry vision.  swollen legs or ankles Side effects that usually do not require medical attention (report to your doctor or health care professional if they continue or are bothersome):  anxiety or nervousness  change in sex drive or performance  dry skin  headache  nightmares or trouble sleeping  short term memory loss  stomach upset or diarrhea This list may not describe all possible side effects. Call your doctor for medical advice about side effects. You may report side effects to FDA at 1-800-FDA-1088. Where should I keep my medicine? Keep out of the reach of children and pets. Store at room temperature between 20 and 25 degrees C (68 and 77 degrees F). Throw away any unused drug after the expiration date. NOTE: This sheet is a summary. It may not cover all possible information. If you have questions about this medicine, talk to your doctor, pharmacist, or health care provider.  2021 Elsevier/Gold Standard (2018-08-31 18:23:00)      Adopting a Healthy Lifestyle.  Know what a healthy weight is for you (roughly BMI <25) and aim to maintain this   Aim for 7+ servings of fruits and vegetables daily   65-80+ fluid ounces of water or unsweet tea for healthy kidneys   Limit to max 1 drink of alcohol per day; avoid smoking/tobacco   Limit animal fats in diet for cholesterol and heart health - choose grass fed whenever available   Avoid highly processed foods, and foods high in saturated/trans fats   Aim for low stress - take time to unwind and care for your mental health   Aim for 150 min of moderate intensity exercise weekly for heart health, and  weights twice weekly for bone health   Aim for 7-9 hours of sleep daily   When it comes to diets, agreement about the perfect plan isnt easy to find, even among the experts. Experts at the [facility] of [employer] developed an idea known as the Healthy Eating Plate. Just imagine a plate divided into logical, healthy portions.   The emphasis is on diet quality:   Load up on vegetables and fruits - one-half of your plate: Aim for color and variety, and remember that potatoes dont count.   Go for whole grains - one-quarter of your plate: Whole wheat, barley, wheat berries, quinoa, oats, brown rice, and foods made with them. If you want pasta, go with whole wheat pasta.   Protein power - one-quarter of your plate: Fish, chicken, beans, and nuts are all healthy, versatile protein sources. Limit red meat.   The diet, however, does go beyond the plate, offering a few other suggestions.   Use healthy plant oils, such as olive, canola, soy, corn, sunflower and peanut. Check the labels, and avoid partially hydrogenated oil, which have unhealthy trans fats.   If youre thirsty, drink water. Coffee and tea are  good in moderation, but skip sugary drinks and limit milk and dairy products to one or two daily servings.   The type of carbohydrate in the diet is more important than the amount. Some sources of carbohydrates, such as vegetables, fruits, whole grains, and beans-are healthier than others.   Finally, stay active  Signed, Thomasene Ripple, DO  05/29/2020 4:53 PM    [facility]

## 2020-05-29 NOTE — Patient Instructions (Signed)
Medication Instructions:  Your physician has recommended you make the following change in your medication:   Stop Atenolol Start Metoprolol succinate 25 mg daily.  *If you need a refill on your cardiac medications before your next appointment, please call your pharmacy*   Lab Work: None ordered If you have labs (blood work) drawn today and your tests are completely normal, you will receive your results only by: Frances Watkins MyChart Message (if you have MyChart) OR . A paper copy in the mail If you have any lab test that is abnormal or we need to change your treatment, we will call you to review the results.   Testing/Procedures: None ordered   Follow-Up: At [facility], you and your health needs are our priority.  As part of our continuing mission to provide you with exceptional heart care, we have created designated Provider Care Teams.  These Care Teams include your primary Cardiologist (physician) and Advanced Practice Providers (APPs -  Physician Assistants and Nurse Practitioners) who all work together to provide you with the care you need, when you need it.  We recommend signing up for the patient portal called "MyChart".  Sign up information is provided on this After Visit Summary.  MyChart is used to connect with patients for Virtual Visits (Telemedicine).  Patients are able to view lab/test results, encounter notes, upcoming appointments, etc.  Non-urgent messages can be sent to your provider as well.   To learn more about what you can do with MyChart, go to [email or web address].    Your next appointment:   6 week(s)  The format for your next appointment:   In Person  Provider:   Thomasene Ripple, DO   Other Instructions Metoprolol Extended-Release Tablets What is this medicine? METOPROLOL (me TOE proe lole) is a beta blocker. It decreases the amount of work your heart has to do and helps your heart beat regularly. It treats high blood pressure and/or prevent chest pain (also  called angina). It also treats heart failure. This medicine may be used for other purposes; ask your health care provider or pharmacist if you have questions. COMMON BRAND NAME(S): toprol, Toprol XL What should I tell my health care provider before I take this medicine? They need to know if you have any of these conditions:  diabetes  heart or vessel disease like slow heart rate, worsening heart failure, heart block, sick sinus syndrome or Raynaud's disease  kidney disease  liver disease  lung or breathing disease, like asthma or emphysema  pheochromocytoma  thyroid disease  an unusual or allergic reaction to metoprolol, other beta-blockers, medicines, foods, dyes, or preservatives  pregnant or trying to get pregnant  breast-feeding How should I use this medicine? Take this drug by mouth. Take it as directed on the prescription label at the same time every day. Take it with food. You may cut the tablet in half if it is scored (has a line in the middle of it). This may help you swallow the tablet if the whole tablet is too big. Be sure to take both halves. Do not take just one-half of the tablet. Keep taking it unless your health care provider tells you to stop. Talk to your health care provider about the use of this drug in children. While it may be prescribed for children as young as 6 for selected conditions, precautions do apply. Overdosage: If you think you have taken too much of this medicine contact a poison control center or emergency room at once. NOTE: This  medicine is only for you. Do not share this medicine with others. What if I miss a dose? If you miss a dose, take it as soon as you can. If it is almost time for your next dose, take only that dose. Do not take double or extra doses. What may interact with this medicine? This medicine may interact with the following medications:  certain medicines for blood pressure, heart disease, irregular heart beat  certain  medicines for depression, like monoamine oxidase (MAO) inhibitors, fluoxetine, or paroxetine  clonidine  dobutamine  epinephrine  isoproterenol  reserpine This list may not describe all possible interactions. Give your health care provider a list of all the medicines, herbs, non-prescription drugs, or dietary supplements you use. Also tell them if you smoke, drink alcohol, or use illegal drugs. Some items may interact with your medicine. What should I watch for while using this medicine? Visit your doctor or health care professional for regular check ups. Contact your doctor right away if your symptoms worsen. Check your blood pressure and pulse rate regularly. Ask your health care professional what your blood pressure and pulse rate should be, and when you should contact them. You may get drowsy or dizzy. Do not drive, use machinery, or do anything that needs mental alertness until you know how this medicine affects you. Do not sit or stand up quickly, especially if you are an older patient. This reduces the risk of dizzy or fainting spells. Contact your doctor if these symptoms continue. Alcohol may interfere with the effect of this medicine. Avoid alcoholic drinks. This medicine may increase blood sugar. Ask your healthcare provider if changes in diet or medicines are needed if you have diabetes. What side effects may I notice from receiving this medicine? Side effects that you should report to your doctor or health care professional as soon as possible:  allergic reactions like skin rash, itching or hives  cold or numb hands or feet  depression  difficulty breathing  faint  fever with sore throat  irregular heartbeat, chest pain  rapid weight gain  signs and symptoms of high blood sugar such as being more thirsty or hungry or having to urinate more than normal. You may also feel very tired or have blurry vision.  swollen legs or ankles Side effects that usually do not  require medical attention (report to your doctor or health care professional if they continue or are bothersome):  anxiety or nervousness  change in sex drive or performance  dry skin  headache  nightmares or trouble sleeping  short term memory loss  stomach upset or diarrhea This list may not describe all possible side effects. Call your doctor for medical advice about side effects. You may report side effects to FDA at 1-800-FDA-1088. Where should I keep my medicine? Keep out of the reach of children and pets. Store at room temperature between 20 and 25 degrees C (68 and 77 degrees F). Throw away any unused drug after the expiration date. NOTE: This sheet is a summary. It may not cover all possible information. If you have questions about this medicine, talk to your doctor, pharmacist, or health care provider.  2021 Elsevier/Gold Standard (2018-08-31 18:23:00)

## 2020-05-30 ENCOUNTER — Telehealth: Payer: Self-pay

## 2020-05-30 NOTE — Telephone Encounter (Signed)
Attempted to contact patient and left message on machine to advise patient samples have been reserved for her.   Medication Samples have been provided to the patient.  Drug name: Henderson [place]       Strength: 30mg         Qty: 2 boxes  LOT:  Exp.Date: 07/01/2021  Dosing instructions: Take 1 tablet by mouth twice daily.   Samples have been signed out in sample book as well.

## 2020-05-30 NOTE — Telephone Encounter (Signed)
Patient called requesting a return call to let her know if she can stop by the office to pick up samples of Otezla.

## 2020-06-25 ENCOUNTER — Telehealth: Payer: Self-pay

## 2020-06-25 NOTE — Telephone Encounter (Signed)
I called patient, we will give a 30 day supply (2 boxes)

## 2020-06-25 NOTE — Telephone Encounter (Signed)
Patient left a voicemail stating she is going out of town and requested a return call to let her know if she can pick up samples of her medication.

## 2020-07-03 ENCOUNTER — Ambulatory Visit: Payer: No Typology Code available for payment source | Admitting: Cardiology

## 2020-09-10 NOTE — Progress Notes (Deleted)
Office Visit Note  Patient: Frances Watkins             Date of Birth: 06/14/1965           MRN: [id number]             PCP: Karilyn Cota, NP Referring: Alinda Deem, MD Visit Date: 09/23/2020 Occupation: @GUAROCC @  Subjective:  No chief complaint on file.   History of Present Illness: Frances Watkins is a 55 y.o. female ***   Activities of Daily Living:  Patient reports morning stiffness for *** {minute/hour:19697}.   Patient {ACTIONS;DENIES/REPORTS:21021675::"Denies"} nocturnal pain.  Difficulty dressing/grooming: {ACTIONS;DENIES/REPORTS:21021675::"Denies"} Difficulty climbing stairs: {ACTIONS;DENIES/REPORTS:21021675::"Denies"} Difficulty getting out of chair: {ACTIONS;DENIES/REPORTS:21021675::"Denies"} Difficulty using hands for taps, buttons, cutlery, and/or writing: {ACTIONS;DENIES/REPORTS:21021675::"Denies"}  No Rheumatology ROS completed.   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   Hypertension 05/29/2020   History of stomach ulcers    PONV (postoperative nausea and vomiting)    Tachycardia    Palpitations 04/09/2020   PAF (paroxysmal atrial fibrillation) (HCC) 04/09/2020   Primary hypertension 04/09/2020   GERD (gastroesophageal reflux disease)    Heart murmur    Psoriasis 09/21/2018   Anxiety 09/21/2018   Essential hypertension 09/21/2018   History of kidney stones 09/21/2018   Eustachian tube dysfunction 10/05/2011   History of cholesteatoma 10/05/2011    Past Medical History:  Diagnosis Date   Anxiety 09/21/2018   Essential hypertension 09/21/2018   Eustachian tube dysfunction 10/05/2011   GERD (gastroesophageal reflux disease)    Heart murmur    no treatment   History of cholesteatoma 10/05/2011   History of kidney stones    History of stomach ulcers    PAF (paroxysmal atrial fibrillation) (HCC) 04/09/2020   Palpitations 04/09/2020   PONV  (postoperative nausea and vomiting)    no problem  03/14/12   Primary hypertension 04/09/2020   Psoriasis    Tachycardia     Family History  Problem Relation Age of Onset   Arthritis Mother    Diabetes Father    Hypertension Father    High Cholesterol Father    Heart disease Father    Heart attack Father    Gout Father    Cirrhosis Father    Healthy Sister    Psoriasis Daughter        psoriatic arthritis   Healthy Daughter    Lymphoma Maternal Grandmother    Cancer Paternal Grandfather    Past Surgical History:  Procedure Laterality Date   COLONOSCOPY     2014, 2019   HOLMIUM LASER APPLICATION Right 03/23/2012   Procedure: HOLMIUM LASER APPLICATION;  Surgeon: 03/25/2012, MD;  Location: [facility];  Service: Urology;  Laterality: Right;   INNER EAR SURGERY  2000   LITHOTRIPSY     x2   TONSILLECTOMY     age 63 yrs.   TYMPANOPLASTY W/ MASTOIDECTOMY     x2   UPPER GI ENDOSCOPY     2014, 2019   URETEROSCOPY  2014   Social History   Social History Narrative   Not on file   Immunization History  Administered Date(s) Administered   PFIZER(Purple Top)SARS-COV-2 Vaccination 04/05/2019, 04/24/2019, 10/05/2019   Pneumococcal Conjugate-13 09/27/2018     Objective: Vital Signs: There were no vitals taken for this visit.   Physical Exam   Musculoskeletal Exam: ***  CDAI Exam: CDAI Score: --  Patient Global: --; Provider Global: -- Swollen: --; Tender: -- Joint Exam 09/23/2020   No joint exam has been documented for this visit   There is currently no information documented on the homunculus. Go to the Rheumatology activity and complete the homunculus joint exam.  Investigation: No additional findings.  Imaging: No results found.  Recent Labs: Lab Results  Component Value Date   WBC 5.6 05/04/2019   HGB 13.1 05/04/2019   PLT 289 05/04/2019   NA 141 06/19/2019   K 4.1 06/19/2019   CL 106 06/19/2019   CO2 30 06/19/2019   GLUCOSE 93  06/19/2019   BUN 13 06/19/2019   CREATININE 0.72 06/19/2019   BILITOT 0.5 06/19/2019   ALKPHOS 73 01/30/2019   AST 17 06/19/2019   ALT 14 06/19/2019   PROT 6.6 06/19/2019   ALBUMIN 4.5 01/30/2019   CALCIUM 8.9 06/19/2019   GFRAA 112 06/19/2019    Speciality Comments: No specialty comments available.  Procedures:  No procedures performed Allergies: Sulfa drugs cross reactors   Assessment / Plan:     Visit Diagnoses: No diagnosis found.  Orders: No orders of the defined types were placed in this encounter.  No orders of the defined types were placed in this encounter.   Face-to-face time spent with patient was *** minutes. Greater than 50% of time was spent in counseling and coordination of care.  Follow-Up Instructions: No follow-ups on file.   Ellen Henri, CMA  Note - This record has been created using Animal nutritionist.  Chart creation errors have been sought, but may not always  have been located. Such creation errors do not reflect on  the standard of medical care.

## 2020-09-23 ENCOUNTER — Ambulatory Visit: Payer: No Typology Code available for payment source | Admitting: Rheumatology

## 2020-09-23 DIAGNOSIS — L409 Psoriasis, unspecified: Secondary | ICD-10-CM

## 2020-09-23 DIAGNOSIS — Z87442 Personal history of urinary calculi: Secondary | ICD-10-CM

## 2020-09-23 DIAGNOSIS — G8929 Other chronic pain: Secondary | ICD-10-CM

## 2020-09-23 DIAGNOSIS — L4050 Arthropathic psoriasis, unspecified: Secondary | ICD-10-CM

## 2020-09-23 DIAGNOSIS — F419 Anxiety disorder, unspecified: Secondary | ICD-10-CM

## 2020-09-23 DIAGNOSIS — I493 Ventricular premature depolarization: Secondary | ICD-10-CM

## 2020-09-23 DIAGNOSIS — M722 Plantar fascial fibromatosis: Secondary | ICD-10-CM

## 2020-09-23 DIAGNOSIS — I10 Essential (primary) hypertension: Secondary | ICD-10-CM

## 2020-09-23 DIAGNOSIS — M7662 Achilles tendinitis, left leg: Secondary | ICD-10-CM

## 2020-09-23 DIAGNOSIS — L578 Other skin changes due to chronic exposure to nonionizing radiation: Secondary | ICD-10-CM

## 2020-09-23 DIAGNOSIS — Z79899 Other long term (current) drug therapy: Secondary | ICD-10-CM

## 2020-10-02 NOTE — Progress Notes (Signed)
Office Visit Note  Patient: Frances Watkins             Date of Birth: [birth year]           MRN: [id number]             PCP: Karilyn Cota, NP Referring: Alinda Deem, MD Visit Date: 10/07/2020 Occupation: @GUAROCC @  Subjective:  Joint stiffness.   History of Present Illness: Frances Watkins is a 55 y.o. female with a history of psoriasis and psoriatic arthritis.  She states she has been taking 40 and methotrexate on a regular basis.  She has not had any psoriasis flare.  She works on [place] for several hours.  She experiences some thoracic discomfort.  She denies any SI joint pain.  She denies any history of joint swelling.  She is occasional discomfort in her left Achilles tendon.  She has not had any episodes of plantar fasciitis.  Activities of Daily Living:  Patient reports morning stiffness for 5-10 minutes.   Patient Denies nocturnal pain.  Difficulty dressing/grooming: Denies Difficulty climbing stairs: Reports Difficulty getting out of chair: Denies Difficulty using hands for taps, buttons, cutlery, and/or writing: Denies  Review of Systems  Constitutional:  Positive for fatigue.  HENT:  Negative for mouth sores, mouth dryness and nose dryness.   Eyes:  Negative for pain, itching and dryness.  Respiratory:  Negative for shortness of breath and difficulty breathing.   Cardiovascular:  Positive for palpitations. Negative for chest pain.  Gastrointestinal:  Negative for blood in stool, constipation and diarrhea.  Endocrine: Negative for increased urination.  Genitourinary:  Negative for difficulty urinating.  Musculoskeletal:  Positive for joint pain, joint pain and morning stiffness. Negative for joint swelling, myalgias, muscle tenderness and myalgias.  Skin:  Negative for color change, rash, redness and sensitivity to sunlight.  Allergic/Immunologic: Negative for susceptible to infections.  Neurological:  Negative for dizziness, numbness, headaches, memory loss  and weakness.  Hematological:  Negative for bruising/bleeding tendency.  Psychiatric/Behavioral:  Negative for depressed mood, confusion and sleep disturbance. The patient is not nervous/anxious.    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   Hypertension 05/29/2020   History of stomach ulcers    PONV (postoperative nausea and vomiting)    Tachycardia    Palpitations 04/09/2020   PAF (paroxysmal atrial fibrillation) (HCC) 04/09/2020   Primary hypertension 04/09/2020   GERD (gastroesophageal reflux disease)    Heart murmur    Psoriasis 09/21/2018   Anxiety 09/21/2018   Essential hypertension 09/21/2018   History of kidney stones 09/21/2018   Eustachian tube dysfunction 10/05/2011   History of cholesteatoma 10/05/2011    Past Medical History:  Diagnosis Date   Anxiety 09/21/2018   Essential hypertension 09/21/2018   Eustachian tube dysfunction 10/05/2011   GERD (gastroesophageal reflux disease)    Heart murmur    no treatment   History of cholesteatoma 10/05/2011   History of kidney stones    History of stomach ulcers    PAF (paroxysmal atrial fibrillation) (HCC) 04/09/2020   Palpitations 04/09/2020   PONV (postoperative nausea and vomiting)    no problem  03/14/12   Primary hypertension 04/09/2020   Psoriasis    Tachycardia     Family History  Problem Relation Age of Onset   Arthritis Mother    Diabetes Father    Hypertension Father    High  Cholesterol Father    Heart disease Father    Heart attack Father    Gout Father    Cirrhosis Father    Healthy Sister    Psoriasis Daughter        psoriatic arthritis   Healthy Daughter    Lymphoma Maternal Grandmother    Cancer Paternal Grandfather    Past Surgical History:  Procedure Laterality Date   COLONOSCOPY     2014, 2019   HOLMIUM LASER APPLICATION Right 03/23/2012   Procedure: HOLMIUM LASER APPLICATION;   Surgeon: Anner Crete, MD;  Location[facility];  Service: Urology;  Laterality: Right;   INNER EAR SURGERY  2000   LITHOTRIPSY     x2   TONSILLECTOMY     age 21 yrs.   TYMPANOPLASTY W/ MASTOIDECTOMY     x2   UPPER GI ENDOSCOPY     2014, 2019   URETEROSCOPY  2014   Social History   Social History Narrative   Not on file   Immunization History  Administered Date(s) Administered   PFIZER(Purple Top)SARS-COV-2 Vaccination 04/05/2019, 04/24/2019, 10/05/2019   Pneumococcal Conjugate-13 09/27/2018     Objective: Vital Signs: BP (!) 137/91 (BP Location: Left Arm, Patient Position: Sitting, Cuff Size: Normal)   Pulse (!) 49   Ht 5\' 3"  (1.6 m)   Wt 177 lb (80.3 kg)   BMI 31.35 kg/m    Physical Exam Vitals and nursing note reviewed.  Constitutional:      Appearance: She is well-developed.  HENT:     Head: Normocephalic and atraumatic.  Eyes:     Conjunctiva/sclera: Conjunctivae normal.  Cardiovascular:     Rate and Rhythm: Normal rate and regular rhythm.     Heart sounds: Normal heart sounds.  Pulmonary:     Effort: Pulmonary effort is normal.     Breath sounds: Normal breath sounds.  Abdominal:     General: Bowel sounds are normal.     Palpations: Abdomen is soft.  Musculoskeletal:     Cervical back: Normal range of motion.  Lymphadenopathy:     Cervical: No cervical adenopathy.  Skin:    General: Skin is warm and dry.     Capillary Refill: Capillary refill takes less than 2 seconds.  Neurological:     Mental Status: She is alert and oriented to person, place, and time.  Psychiatric:        Behavior: Behavior normal.     Musculoskeletal Exam: C-spine, thoracic and lumbar spine were in good range of motion.  She had no point tenderness over the thoracic region.  She had no SI joint tenderness.  Shoulder joints, elbow joints, wrist joints, MCPs PIPs and DIPs with good range of motion with no synovitis.  Hip joints, knee joints, ankles, MTPs and PIPs  with good range of motion with no synovitis.  She had bilateral dorsal spurs and PIP and DIP thickening consistent with osteoarthritis.  She has bilateral pes cavus.  CDAI Exam: CDAI Score: -- Patient Global: --; Provider Global: -- Swollen: --; Tender: -- Joint Exam 10/07/2020   No joint exam has been documented for this visit   There is currently no information documented on the homunculus. Go to the Rheumatology activity and complete the homunculus joint exam.  Investigation: No additional findings.  Imaging: No results found.  Recent Labs: Lab Results  Component Value Date   WBC 5.6 05/04/2019   HGB 13.1 05/04/2019   PLT 289 05/04/2019   NA 141 06/19/2019  K 4.1 06/19/2019   CL 106 06/19/2019   CO2 30 06/19/2019   GLUCOSE 93 06/19/2019   BUN 13 06/19/2019   CREATININE 0.72 06/19/2019   BILITOT 0.5 06/19/2019   ALKPHOS 73 01/30/2019   AST 17 06/19/2019   ALT 14 06/19/2019   PROT 6.6 06/19/2019   ALBUMIN 4.5 01/30/2019   CALCIUM 8.9 06/19/2019   GFRAA 112 06/19/2019    Speciality Comments: No specialty comments available.  Procedures:  No procedures performed Allergies: Sulfa drugs cross reactors   Assessment / Plan:     Visit Diagnoses: Psoriatic arthritis (HCC)-she has done well on the combination therapy with Otezla and methotrexate.  She had no synovitis on my examination today.  She gives history of intermittent discomfort in her thoracic region in her left foot which she describes over the Achilles tendon.  She had no tenderness over the Achilles tendon or plantar fascia today.  Psoriasis-she had no active psoriasis lesions.  High risk medication use - Otezla 30 mg 1 tablet by mouth twice daily, Methotrexate 0.8 ml sq injections once weekly and folic acid 2 mg po daily.  The last labs we have are from March 2022 from her PCPs office.  She is supposed to get labs every 3 months to monitor for drug toxicity.  Need for getting the labs to prevent any organ  damage on a regular basis was emphasized.  She does not want to get labs in our office today.  She states she will get labs from her PCPs office.  I advised her to get labs as soon as possible to get further refills of methotrexate.  Otezla samples were given to the patient today.  She was also advised to stop methotrexate in case she develops an infection.  She may resume methotrexate once infection resolves.  She is planning to get a COVID-19 booster.  Advised her to hold methotrexate for a week after the COVID-19 booster.  Information regarding other immunization were also placed in the AVS.  Pain in thoracic spine-she complains of some thoracic spine pain.  Is probably coming from prolonged sitting on the computer.  She had no point tenderness.  Stretching exercises were discussed in the office.  Pes cavus-she has bilateral pes cavus and dorsal spurs.  Proper fitting shoes with arch support were discussed.  Plantar fasciitis, bilateral-  Achilles tendinitis, left leg -she gives history of off-and-on discomfort in her left Achilles tendon.  She had no tenderness on my examination today.  Chronic SI joint pain-she had no SI joint discomfort.  Essential hypertension-blood pressures are still elevated.  She is on metoprolol and hydrochlorothiazide.  I advised her to monitor blood pressure closely.  Increased risk of heart disease with psoriatic arthritis was discussed.  Dietary modifications and exercise was emphasized.  Handout was placed in the AVS.  Solar elastosis  History of kidney stones  Anxiety  PVC's (premature ventricular contractions) - Followed by Dr. Servando Salina.  She had an echocardiogram performed on 04/16/2020 which revealed mild left ventricular thickening.    Orders: No orders of the defined types were placed in this encounter.  No orders of the defined types were placed in this encounter.    Follow-Up Instructions: Return in about 5 months (around 03/09/2021) for Psoriatic  arthritis.   Pollyann Savoy, MD  Note - This record has been created using Animal nutritionist.  Chart creation errors have been sought, but may not always  have been located. Such creation errors do not reflect on  the standard of medical care.

## 2020-10-07 ENCOUNTER — Encounter: Payer: Self-pay | Admitting: Rheumatology

## 2020-10-07 ENCOUNTER — Ambulatory Visit (INDEPENDENT_AMBULATORY_CARE_PROVIDER_SITE_OTHER): Payer: No Typology Code available for payment source | Admitting: Rheumatology

## 2020-10-07 ENCOUNTER — Other Ambulatory Visit: Payer: Self-pay

## 2020-10-07 VITALS — BP 137/91 | HR 49 | Ht 63.0 in | Wt 177.0 lb

## 2020-10-07 DIAGNOSIS — M533 Sacrococcygeal disorders, not elsewhere classified: Secondary | ICD-10-CM

## 2020-10-07 DIAGNOSIS — L4050 Arthropathic psoriasis, unspecified: Secondary | ICD-10-CM | POA: Diagnosis not present

## 2020-10-07 DIAGNOSIS — L578 Other skin changes due to chronic exposure to nonionizing radiation: Secondary | ICD-10-CM

## 2020-10-07 DIAGNOSIS — L409 Psoriasis, unspecified: Secondary | ICD-10-CM

## 2020-10-07 DIAGNOSIS — M7662 Achilles tendinitis, left leg: Secondary | ICD-10-CM

## 2020-10-07 DIAGNOSIS — M546 Pain in thoracic spine: Secondary | ICD-10-CM | POA: Diagnosis not present

## 2020-10-07 DIAGNOSIS — I10 Essential (primary) hypertension: Secondary | ICD-10-CM

## 2020-10-07 DIAGNOSIS — I493 Ventricular premature depolarization: Secondary | ICD-10-CM

## 2020-10-07 DIAGNOSIS — F419 Anxiety disorder, unspecified: Secondary | ICD-10-CM

## 2020-10-07 DIAGNOSIS — M722 Plantar fascial fibromatosis: Secondary | ICD-10-CM

## 2020-10-07 DIAGNOSIS — Q667 Congenital pes cavus, unspecified foot: Secondary | ICD-10-CM

## 2020-10-07 DIAGNOSIS — G8929 Other chronic pain: Secondary | ICD-10-CM

## 2020-10-07 DIAGNOSIS — Z79899 Other long term (current) drug therapy: Secondary | ICD-10-CM | POA: Diagnosis not present

## 2020-10-07 DIAGNOSIS — Z87442 Personal history of urinary calculi: Secondary | ICD-10-CM

## 2020-10-07 NOTE — Progress Notes (Signed)
Medication Samples have been provided to the patient.  Drug name: Henderson [place]       Strength: 30mg         Qty: 6 packs  LOT:  Exp.Date: 07/01/2021    Dosing instructions: Take 1 tablet by mouth twice daily.

## 2020-10-07 NOTE — Patient Instructions (Signed)
Standing Labs We placed an order today for your standing lab work.   Please have your standing labs (CBC with diff, CMP with GFR)drawn in September and every 3 months  If possible, please have your labs drawn 2 weeks prior to your appointment so that the provider can discuss your results at your appointment.  Please note that you may see your imaging and lab results in MyChart before we have reviewed them. We may be awaiting multiple results to interpret others before contacting you. Please allow our office up to 72 hours to thoroughly review all of the results before contacting the office for clarification of your results.  We have open lab daily: Monday through Thursday from 1:30-4:30 PM and Friday from 1:30-4:00 PM at the office of Dr. Pollyann Savoy, [facility].   Please be advised, all patients with office appointments requiring lab work will take precedent over walk-in lab work.  If possible, please come for your lab work on Monday and Friday afternoons, as you may experience shorter wait times. The office is located at [address], [place], [place] [address] No appointment is necessary.   Labs are drawn by [facility]. Please bring your co-pay at the time of your lab draw.  You may receive a bill from [facility] for your lab work.  If you wish to have your labs drawn at another location, please call the office 24 hours in advance to send orders.  If you have any questions regarding directions or hours of operation,  please call [phone].   As a reminder, please drink plenty of water prior to coming for your lab work. Thanks!   Vaccines You are taking a medication(s) that can suppress your immune system.  The following immunizations are recommended: Flu annually Covid-19  Td/Tdap (tetanus, diphtheria, pertussis) every 10 years Pneumonia (Prevnar 15 then Pneumovax 23 at least 1 year apart.  Alternatively, can take Prevnar 20 without needing additional  dose) Shingrix: 2 doses from 4 weeks to 6 months apart  Please check with your PCP to make sure you are up to date.   If you test POSITIVE for COVID19 and have MILD to MODERATE symptoms: First, call your PCP if you would like to receive COVID19 treatment AND Hold your medications during the infection and for at least 1 week after your symptoms have resolved: Injectable medication (Benlysta, Cimzia, Cosentyx, Enbrel, Humira, Orencia, Remicade, Simponi, Stelara, Taltz, Tremfya) Methotrexate Leflunomide (Arava) Azathioprine Mycophenolate (Cellcept) Osborne [place], or Rinvoq Otezla If you take Actemra or Kevzara, you DO NOT need to hold these for COVID19 infection.  If you test POSITIVE for COVID19 and have NO symptoms: First, call your PCP if you would like to receive COVID19 treatment AND Hold your medications for at least 10 days after the day that you tested positive Injectable medication (Benlysta, Cimzia, Cosentyx, Enbrel, Humira, Orencia, Remicade, Simponi, Stelara, Taltz, Tremfya) Methotrexate Leflunomide (Arava) Azathioprine Mycophenolate (Cellcept) Osborne [place], or Rinvoq Otezla If you take Actemra or Kevzara, you DO NOT need to hold these for COVID19 infection.  If you have signs or symptoms of an infection or start antibiotics: First, call your PCP for workup of your infection. Hold your medication through the infection, until you complete your antibiotics, and until symptoms resolve if you take the following: Injectable medication (Actemra, Benlysta, Cimzia, Cosentyx, Enbrel, Humira, Kevzara, Orencia, Remicade, Simponi, Stelara, Taltz, Tremfya) Methotrexate Leflunomide (Arava) Mycophenolate (Cellcept) Osborne [place], or Rinvoq  Heart Disease Prevention   Your inflammatory disease increases your  risk of heart disease which includes heart attack, stroke, atrial fibrillation (irregular heartbeats), high blood pressure, heart failure and atherosclerosis (plaque  in the arteries).  It is important to reduce your risk by:   Keep blood pressure, cholesterol, and blood sugar at healthy levels   Smoking Cessation   Maintain a healthy weight  BMI 20-25   Eat a healthy diet  Plenty of fresh fruit, vegetables, and whole grains  Limit saturated fats, foods high in sodium, and added sugars  DASH and Mediterranean diet   Increase physical activity  Recommend moderate physically activity for 150 minutes per week/ 30 minutes a day for five days a week These can be broken up into three separate ten-minute sessions during the day.   Reduce Stress  Meditation, slow breathing exercises, yoga, coloring books  Dental visits twice a year

## 2020-10-14 ENCOUNTER — Other Ambulatory Visit: Payer: Self-pay | Admitting: *Deleted

## 2020-10-14 NOTE — Telephone Encounter (Signed)
error 

## 2020-10-16 ENCOUNTER — Ambulatory Visit (INDEPENDENT_AMBULATORY_CARE_PROVIDER_SITE_OTHER): Payer: Self-pay | Admitting: Cardiology

## 2020-10-16 ENCOUNTER — Other Ambulatory Visit: Payer: Self-pay

## 2020-10-16 ENCOUNTER — Encounter: Payer: Self-pay | Admitting: Cardiology

## 2020-10-16 VITALS — BP 188/100 | HR 98 | Ht 64.0 in | Wt 179.4 lb

## 2020-10-16 DIAGNOSIS — I471 Supraventricular tachycardia: Secondary | ICD-10-CM

## 2020-10-16 DIAGNOSIS — I4719 Other supraventricular tachycardia: Secondary | ICD-10-CM

## 2020-10-16 DIAGNOSIS — I493 Ventricular premature depolarization: Secondary | ICD-10-CM

## 2020-10-16 DIAGNOSIS — E669 Obesity, unspecified: Secondary | ICD-10-CM

## 2020-10-16 DIAGNOSIS — I10 Essential (primary) hypertension: Secondary | ICD-10-CM

## 2020-10-16 NOTE — Progress Notes (Signed)
Cardiology Office Note:    Date:  10/16/2020   ID:  Frances Watkins, DOB [birth year], MRN [id number]  PCP:  Karilyn Cota, NP  Cardiologist:  Thomasene Ripple, DO  Electrophysiologist:  None   Referring MD: Karilyn Cota, NP   Chief Complaint  Patient presents with   Follow-up    History of Present Illness:    Frances Watkins is a 55 y.o. female with a hx of of psoriatic arthritis, osteoarthritis, hypertension, AVNRT, symptomatic PVCs seen on ZIO monitor is here today for follow-up visit.  I saw the patient for the first time on April 09, 2020 at which time she was concerned given the fact that she had her husband apple watch reported atrial fibrillation and rapid ventricular rate.  She did have some prescription for this.  I placed a monitor on the patient.  She did wear a monitor and she is here today to discuss the result of her monitor as well as her echocardiogram.  I saw the patient on May 29, 2020 at that time we discussed her monitor results.  I stop the atenolol and started the patient on metoprolol succinate 25 mg daily.  She is here today for follow-up visit.  She tells me that she has been doing well.  She offers no complaints at this time.  She tells me the metoprolol is working.   Past Medical History:  Diagnosis Date   Anxiety 09/21/2018   Essential hypertension 09/21/2018   Eustachian tube dysfunction 10/05/2011   GERD (gastroesophageal reflux disease)    Heart murmur    no treatment   History of cholesteatoma 10/05/2011   History of kidney stones    History of stomach ulcers    PAF (paroxysmal atrial fibrillation) (HCC) 04/09/2020   Palpitations 04/09/2020   PONV (postoperative nausea and vomiting)    no problem  03/14/12   Primary hypertension 04/09/2020   Psoriasis    Tachycardia     Past Surgical History:  Procedure Laterality Date   COLONOSCOPY     2014, 2019   HOLMIUM LASER APPLICATION Right 03/23/2012   Procedure: HOLMIUM LASER APPLICATION;  Surgeon: Anner Crete, MD;  Location: [facility];  Service: Urology;  Laterality: Right;   INNER EAR SURGERY  2000   LITHOTRIPSY     x2   TONSILLECTOMY     age 67 yrs.   TYMPANOPLASTY W/ MASTOIDECTOMY     x2   UPPER GI ENDOSCOPY     2014, 2019   URETEROSCOPY  2014    Current Medications: Current Meds  Medication Sig   Apremilast 30 MG TABS Take 1 tablet (30 mg total) by mouth in the morning and at bedtime. After completion of starter pack.   escitalopram (LEXAPRO) 10 MG tablet Take 10 mg by mouth daily.   folic acid (FOLVITE) 1 MG tablet Take 2 tablets (2 mg total) by mouth daily.   hydrochlorothiazide (MICROZIDE) 12.5 MG capsule Take 1 capsule (12.5 mg total) by mouth daily.   Liraglutide -Weight Management (SAXENDA Ten Sleep) Inject 1.8 mLs into the skin daily.   methotrexate 50 MG/2ML injection Inject 0.8 mL under the skin once weekly. (Patient taking differently: Inject 50 mg/m2 into the skin once a week. Inject 0.8 mL under the skin once weekly.)   metoprolol succinate (TOPROL XL) 25 MG 24 hr tablet Take 1 tablet (25 mg total) by mouth daily.   Multiple Vitamin (MULTIVITAMIN WITH MINERALS) TABS tablet Take 1 tablet by mouth  daily. Unknown strength   olmesartan (BENICAR) 40 MG tablet Take 40 mg by mouth daily.   TUBERCULIN SYR 1CC/27GX1/2" (B-D TB SYRINGE 1CC/27GX1/2") 27G X 1/2" 1 ML MISC 12 Syringes by Does not apply route once a week. (Patient taking differently: 12 Syringes by Does not apply route once a week. To be used for injectable meds)     Allergies:   Sulfa drugs cross reactors   Social History   Socioeconomic History   Marital status: Married    Spouse name: Not on file   Number of children: Not on file   Years of education: Not on file   Highest education level: Not on file  Occupational History   Not on file  Tobacco Use   Smoking status: Never   Smokeless tobacco: Never  Vaping Use   Vaping Use: Never used  Substance and Sexual Activity   Alcohol use: Yes     Comment: occasional   Drug use: No   Sexual activity: Not on file  Other Topics Concern   Not on file  Social History Narrative   Not on file   Social Determinants of Health   Financial Resource Strain: Not on file  Food Insecurity: Not on file  Transportation Needs: Not on file  Physical Activity: Not on file  Stress: Not on file  Social Connections: Not on file     Family History: The patient's family history includes Arthritis in her mother; Cancer in her paternal grandfather; Cirrhosis in her father; Diabetes in her father; Gout in her father; Healthy in her daughter and sister; Heart attack in her father; Heart disease in her father; High Cholesterol in her father; Hypertension in her father; Lymphoma in her maternal grandmother; Psoriasis in her daughter.  ROS:   Review of Systems  Constitution: Negative for decreased appetite, fever and weight gain.  HENT: Negative for congestion, ear discharge, hoarse voice and sore throat.   Eyes: Negative for discharge, redness, vision loss in right eye and visual halos.  Cardiovascular: Negative for chest pain, dyspnea on exertion, leg swelling, orthopnea and palpitations.  Respiratory: Negative for cough, hemoptysis, shortness of breath and snoring.   Endocrine: Negative for heat intolerance and polyphagia.  Hematologic/Lymphatic: Negative for bleeding problem. Does not bruise/bleed easily.  Skin: Negative for flushing, nail changes, rash and suspicious lesions.  Musculoskeletal: Negative for arthritis, joint pain, muscle cramps, myalgias, neck pain and stiffness.  Gastrointestinal: Negative for abdominal pain, bowel incontinence, diarrhea and excessive appetite.  Genitourinary: Negative for decreased libido, genital sores and incomplete emptying.  Neurological: Negative for brief paralysis, focal weakness, headaches and loss of balance.  Psychiatric/Behavioral: Negative for altered mental status, depression and suicidal ideas.   Allergic/Immunologic: Negative for HIV exposure and persistent infections.    EKGs/Labs/Other Studies Reviewed:    The following studies were reviewed today:   EKG: None today  Transthoracic echocardiogram April 16, 2020 IMPRESSIONS   1. Frequent PVC's. Left ventricular ejection fraction, by estimation, is  60 to 65%. The left ventricle has normal function. The left ventricle has  no regional wall motion abnormalities. There is mild concentric left  ventricular hypertrophy. Left  ventricular diastolic parameters were normal.   2. Right ventricular systolic function is normal. The right ventricular  size is normal. There is normal pulmonary artery systolic pressure.   3. The mitral valve is normal in structure. No evidence of mitral valve  regurgitation. No evidence of mitral stenosis.   4. The aortic valve is tricuspid. Aortic valve  regurgitation is not  visualized. No aortic stenosis is present.   5. The inferior vena cava is normal in size with greater than 50%  respiratory variability, suggesting right atrial pressure of 3 mmHg.   FINDINGS   Left Ventricle: Frequent PVC's. Left ventricular ejection fraction, by  estimation, is 60 to 65%. The left ventricle has normal function. The left  ventricle has no regional wall motion abnormalities. The left ventricular  internal cavity size was normal  in size. There is mild concentric left ventricular hypertrophy. Left  ventricular diastolic parameters were normal. Normal left ventricular  filling pressure.   Right Ventricle: The right ventricular size is normal. No increase in  right ventricular wall thickness. Right ventricular systolic function is  normal. There is normal pulmonary artery systolic pressure. The tricuspid  regurgitant velocity is 2.16 m/s, and   with an assumed right atrial pressure of 3 mmHg, the estimated right  ventricular systolic pressure is 21.7 mmHg.   Left Atrium: Left atrial size was normal in size.    Right Atrium: Right atrial size was normal in size.   Pericardium: There is no evidence of pericardial effusion.   Mitral Valve: The mitral valve is normal in structure. No evidence of  mitral valve regurgitation. No evidence of mitral valve stenosis.   Tricuspid Valve: The tricuspid valve is normal in structure. Tricuspid  valve regurgitation is trivial. No evidence of tricuspid stenosis.   Aortic Valve: The aortic valve is tricuspid. Aortic valve regurgitation is  not visualized. No aortic stenosis is present.   Pulmonic Valve: The pulmonic valve was normal in structure. Pulmonic valve  regurgitation is not visualized. No evidence of pulmonic stenosis.   Aorta: The aortic root and ascending aorta are structurally normal, with  no evidence of dilitation and the ascending aorta was not well visualized.   Venous: The inferior vena cava is normal in size with greater than 50%  respiratory variability, suggesting right atrial pressure of 3 mmHg.   IAS/Shunts: No atrial level shunt detected by color flow Doppler.     ZIO monitor The patient wore the monitor for 13 days 22 hours study April 09, 2020.   Indication: Palpitations   The minimum heart rate was 38 bpm, maximum heart rate was 203 bpm, and average heart rate was 60 bpm. Predominant underlying rhythm was Sinus Rhythm.   23 Supraventricular Tachycardia runs occurred, the run with the fastest interval lasting 12 beats with a maximum rate of 203 bpm, the longest lasting 16.7 secs with an average rate of 146 bpm.     Premature atrial complexes were rare less than 1% Premature Ventricular complexes were rare less than 1%   No atrial tachycardia, no pauses, No AV block and no atrial fibrillation present.   24 patient triggered events 6 associated with supraventricular tachycardia, the also associated with premature ventricular complex.  21 diary events 5 associated with supraventricular tachycardia in the others associated with  premature ventricular and premature atrial complex     Conclusion: This study is is remarkable for:            1.  Paroxysmal symptomatic supraventricular tachycardia (one episode that appear to be AVNRT with multiple others appears to be atrial tachycardia).            2.  Rare symptomatic premature ventricular complex.  Recent Labs: No results found for requested labs within last 8760 hours.  Recent Lipid Panel No results found for: CHOL, TRIG, HDL, CHOLHDL, VLDL, LDLCALC,  LDLDIRECT  Physical Exam:    VS:  BP (!) 188/100 (BP Location: Right Arm, Patient Position: Sitting)   Pulse 98   Ht 5\' 4"  (1.626 m)   Wt 179 lb 6.4 oz (81.4 kg)   SpO2 95%   BMI 30.79 kg/m     Wt Readings from Last 3 Encounters:  10/16/20 179 lb 6.4 oz (81.4 kg)  10/07/20 177 lb (80.3 kg)  05/29/20 165 lb 6.4 oz (75 kg)     GEN: Well nourished, well developed in no acute distress HEENT: Normal NECK: No JVD; No carotid bruits LYMPHATICS: No lymphadenopathy CARDIAC: S1S2 noted,RRR, no murmurs, rubs, gallops RESPIRATORY:  Clear to auscultation without rales, wheezing or rhonchi  ABDOMEN: Soft, non-tender, non-distended, +bowel sounds, no guarding. EXTREMITIES: No edema, No cyanosis, no clubbing MUSCULOSKELETAL:  No deformity  SKIN: Warm and dry NEUROLOGIC:  Alert and oriented x 3, non-focal PSYCHIATRIC:  Normal affect, good insight  ASSESSMENT:    1. Essential hypertension   2. AVNRT (AV nodal re-entry tachycardia) (HCC)   3. Symptomatic PVCs   4. Obesity (BMI 30-39.9)    PLAN:     1.  She is hypertensive in the office today.  She tells me that her blood pressure has recently not been this high.  She just had some notes with salt.  I have asked the patient to take her blood pressure this evening or tomorrow morning and if it is greater than 140/80 I would like to increase her hydrochlorothiazide to 25 mg daily.  The patient understands the need to lose weight with diet and exercise. We have  discussed specific strategies for this.  The patient is in agreement with the above plan. The patient left the office in stable condition.  The patient will follow up in 6 weeks virtually   Medication Adjustments/Labs and Tests Ordered: Current medicines are reviewed at length with the patient today.  Concerns regarding medicines are outlined above.  No orders of the defined types were placed in this encounter.  No orders of the defined types were placed in this encounter.   Patient Instructions  Medication Instructions:  Your physician recommends that you continue on your current medications as directed. Please refer to the Current Medication list given to you today. Take your blood pressure tomorrow if its greater then 140/80 start taking Hydrochlorothiazide 25 mg once daily. (Send 05/31/20 a message letting us know).  *If you need a refill on your cardiac medications before your next appointment, please call your pharmacy*   Lab Work: None If you have labs (blood work) drawn today and your tests are completely normal, you will receive your results only by: MyChart Message (if you have MyChart) OR A paper copy in the mail If you have any lab test that is abnormal or we need to change your treatment, we will call you to review the results.   Testing/Procedures: None   Follow-Up: At [facility], you and your health needs are our priority.  As part of our continuing mission to provide you with exceptional heart care, we have created designated Provider Care Teams.  These Care Teams include your primary Cardiologist (physician) and Advanced Practice Providers (APPs -  Physician Assistants and Nurse Practitioners) who all work together to provide you with the care you need, when you need it.  We recommend signing up for the patient portal called "MyChart".  Sign up information is provided on this After Visit Summary.  MyChart is used to connect with patients  for Virtual Visits  (Telemedicine).  Patients are able to view lab/test results, encounter notes, upcoming appointments, etc.  Non-urgent messages can be sent to your provider as well.   To learn more about what you can do with MyChart, go to [email or web address].    Your next appointment:   6 week(s)  The format for your next appointment:   Virtual Visit   Provider:   Thomasene Ripple, DO [address], [place], [place] [address]    Other Instructions     Adopting a Healthy Lifestyle.  Know what a healthy weight is for you (roughly BMI <25) and aim to maintain this   Aim for 7+ servings of fruits and vegetables daily   65-80+ fluid ounces of water or unsweet tea for healthy kidneys   Limit to max 1 drink of alcohol per day; avoid smoking/tobacco   Limit animal fats in diet for cholesterol and heart health - choose grass fed whenever available   Avoid highly processed foods, and foods high in saturated/trans fats   Aim for low stress - take time to unwind and care for your mental health   Aim for 150 min of moderate intensity exercise weekly for heart health, and weights twice weekly for bone health   Aim for 7-9 hours of sleep daily   When it comes to diets, agreement about the perfect plan isnt easy to find, even among the experts. Experts at the [facility] [employer] developed an idea known as the Healthy Eating Plate. Just imagine a plate divided into logical, healthy portions.   The emphasis is on diet quality:   Load up on vegetables and fruits - one-half of your plate: Aim for color and variety, and remember that potatoes dont count.   Go for whole grains - one-quarter of your plate: Whole wheat, barley, wheat berries, quinoa, oats, brown rice, and foods made with them. If you want pasta, go with whole wheat pasta.   Protein power - one-quarter of your plate: Fish, chicken, beans, and nuts are all healthy, versatile protein sources. Limit red meat.   The diet,  however, does go beyond the plate, offering a few other suggestions.   Use healthy plant oils, such as olive, canola, soy, corn, sunflower and peanut. Check the labels, and avoid partially hydrogenated oil, which have unhealthy trans fats.   If youre thirsty, drink water. Coffee and tea are good in moderation, but skip sugary drinks and limit milk and dairy products to one or two daily servings.   The type of carbohydrate in the diet is more important than the amount. Some sources of carbohydrates, such as vegetables, fruits, whole grains, and beans-are healthier than others.   Finally, stay active  Signed, Thomasene Ripple, DO  10/16/2020 4:08 PM    [facility]

## 2020-10-16 NOTE — Patient Instructions (Signed)
Medication Instructions:  Your physician recommends that you continue on your current medications as directed. Please refer to the Current Medication list given to you today. Take your blood pressure tomorrow if its greater then 140/80 start taking Hydrochlorothiazide 25 mg once daily. (Send [place] a message letting us know).  *If you need a refill on your cardiac medications before your next appointment, please call your pharmacy*   Lab Work: None If you have labs (blood work) drawn today and your tests are completely normal, you will receive your results only by: MyChart Message (if you have MyChart) OR A paper copy in the mail If you have any lab test that is abnormal or we need to change your treatment, we will call you to review the results.   Testing/Procedures: None   Follow-Up: At [facility], you and your health needs are our priority.  As part of our continuing mission to provide you with exceptional heart care, we have created designated Provider Care Teams.  These Care Teams include your primary Cardiologist (physician) and Advanced Practice Providers (APPs -  Physician Assistants and Nurse Practitioners) who all work together to provide you with the care you need, when you need it.  We recommend signing up for the patient portal called "MyChart".  Sign up information is provided on this After Visit Summary.  MyChart is used to connect with patients for Virtual Visits (Telemedicine).  Patients are able to view lab/test results, encounter notes, upcoming appointments, etc.  Non-urgent messages can be sent to your provider as well.   To learn more about what you can do with MyChart, go to [email or web address].    Your next appointment:   6 week(s)  The format for your next appointment:   Virtual Visit   Provider:   Thomasene Ripple, DO [address], [place], [place] [address]    Other Instructions

## 2020-10-17 ENCOUNTER — Telehealth: Payer: Self-pay | Admitting: Cardiology

## 2020-10-17 NOTE — Telephone Encounter (Signed)
Pt c/o BP issue: STAT if pt c/o blurred vision, one-sided weakness or slurred speech  1. What are your last 5 BP readings? 10/17/20  7 am BP 144/69 9 am BP 130/73 10 am BP 136/70 12 pm BP 118/79  2. Are you having any other symptoms (ex. Dizziness, headache, blurred vision, passed out)? No   3. What is your BP issue? Dr. Servando Salina wanted her to report

## 2020-10-23 ENCOUNTER — Other Ambulatory Visit: Payer: Self-pay

## 2020-10-23 DIAGNOSIS — L4050 Arthropathic psoriasis, unspecified: Secondary | ICD-10-CM

## 2020-10-23 MED ORDER — METHOTREXATE SODIUM CHEMO INJECTION 50 MG/2ML: INTRAMUSCULAR | 0 refills | Status: DC

## 2020-10-23 NOTE — Telephone Encounter (Signed)
Patient called to confirm Dr. Corliss Skains received her labwork results from [facility] which Sue Lush confirmed.  Patient requested prescription refill of Methotrexate to be sent to [facility].  Patient states she is out of medication and due to take her injection on Tuesday, 10/28/20.

## 2020-10-23 NOTE — Telephone Encounter (Signed)
Next Visit: 03/10/2021  Last Visit: 10/07/2020  Last Fill: 05/14/2020  DX: Psoriatic arthritis  Current Dose per office note 10/07/2020: Methotrexate 0.8 ml sq injections once weekly   Labs: 10/15/2020 WNL  Okay to refill MTX?

## 2020-10-24 ENCOUNTER — Telehealth: Payer: Self-pay | Admitting: *Deleted

## 2020-10-24 NOTE — Telephone Encounter (Signed)
Labs received from:[facility]  Drawn on:10/15/2020  Reviewed by:Sherron Ales, PA-C  Labs drawn:Lipid Panel, CBC, CMP  Results:WNL

## 2020-11-11 ENCOUNTER — Telehealth: Payer: Self-pay | Admitting: Cardiology

## 2020-11-11 MED ORDER — METOPROLOL SUCCINATE ER 25 MG PO TB24: 50.0000 mg | ORAL_TABLET | Freq: Every day | ORAL | 3 refills | Status: DC

## 2020-11-11 NOTE — Telephone Encounter (Signed)
Returned call to patient-patient reports waking up this morning and feeling a fast/irregular heart beat.   She has an apple watch and took some EKGs-report possible afib.   HR 130-150s.   She states usually increased sodium or caffeine triggers this but did not have either yesterday.  She denies SOB, chest pain.  Reports when she stands up to walk around she becomes lightheaded.  Checked BP and HR while on the phone-BP 181/90, HR 66-94.   She has not taken her AM medications yet as she takes this with food.    Patient sent EKGs via Mychart message.      Per chart review: hx of htn, AVNRT, PVCS.  Advised to take AM meds, hydrate.  Will review with DOD.

## 2020-11-11 NOTE — Telephone Encounter (Signed)
Discussed with patient, aware of recommendations. Appt scheduled tomorrow with Gillian Shields NP at [facility].  ER precautions discussed and patient verbalized understanding.    Routed to MD and NP to make aware.

## 2020-11-11 NOTE — Telephone Encounter (Signed)
Patient c/o Palpitations:  High priority if patient c/o lightheadedness, shortness of breath, or chest pain  How long have you had palpitations/irregular HR/ Afib? Are you having the symptoms now? Since 6:00 am today   Are you currently experiencing lightheadedness, SOB or CP? Only when standing   Do you have a history of afib (atrial fibrillation) or irregular heart rhythm? yes  Have you checked your BP or HR? (document readings if available): Patient wears an apple watch and her watch alerted her of possible  Are you experiencing any other symptoms? Patient said she experiences lightheadedness when she stands up. She feels better when she lays down

## 2020-11-12 ENCOUNTER — Ambulatory Visit (INDEPENDENT_AMBULATORY_CARE_PROVIDER_SITE_OTHER): Payer: Self-pay | Admitting: Family

## 2020-11-12 ENCOUNTER — Encounter (HOSPITAL_BASED_OUTPATIENT_CLINIC_OR_DEPARTMENT_OTHER): Payer: Self-pay | Admitting: Family

## 2020-11-12 ENCOUNTER — Other Ambulatory Visit: Payer: Self-pay

## 2020-11-12 VITALS — BP 144/100 | HR 49 | Ht 63.0 in | Wt 181.0 lb

## 2020-11-12 DIAGNOSIS — I480 Paroxysmal atrial fibrillation: Secondary | ICD-10-CM

## 2020-11-12 DIAGNOSIS — I10 Essential (primary) hypertension: Secondary | ICD-10-CM

## 2020-11-12 DIAGNOSIS — D6869 Other thrombophilia: Secondary | ICD-10-CM

## 2020-11-12 DIAGNOSIS — I4891 Unspecified atrial fibrillation: Secondary | ICD-10-CM

## 2020-11-12 MED ORDER — METOPROLOL SUCCINATE ER 25 MG PO TB24: 37.5000 mg | ORAL_TABLET | Freq: Every day | ORAL | 3 refills | Status: DC

## 2020-11-12 MED ORDER — WARFARIN SODIUM 5 MG PO TABS: 5.0000 mg | ORAL_TABLET | Freq: Every day | ORAL | 2 refills | Status: DC

## 2020-11-12 NOTE — Patient Instructions (Addendum)
Medication Instructions:  Your physician has recommended you make the following change in your medication:    START Warfarin one 5mg  tablet daily TOMORROW in the evening with dinner  CHANGE Metoprolol 1.5 tablets (37.5mg ) daily  *If you need a refill on your cardiac medications before your next appointment, please call your pharmacy*   Lab Work: Your physician recommends that you return for lab work at primary care provider's office for TSH, BMP, CBC. We will reach out to them and ask them to facilitate scheduling.  Testing/Procedures: Your EKG today shows sinus bradycardia   Follow-Up: At [facility], you and your health needs are our priority.  As part of our continuing mission to provide you with exceptional heart care, we have created designated Provider Care Teams.  These Care Teams include your primary Cardiologist (physician) and Advanced Practice Providers (APPs -  Physician Assistants and Nurse Practitioners) who all work together to provide you with the care you need, when you need it.  We recommend signing up for the patient portal called "MyChart".  Sign up information is provided on this After Visit Summary.  MyChart is used to connect with patients for Virtual Visits (Telemedicine).  Patients are able to view lab/test results, encounter notes, upcoming appointments, etc.  Non-urgent messages can be sent to your provider as well.   To learn more about what you can do with MyChart, go to [facility].    Your next appointment:   As scheduled with [email or web address]   Other Instructions  Atrial Fibrillation Atrial fibrillation is a type of heartbeat that is irregular or fast. If you have this condition, your heart beats without any order. This makes it hard for your heart to pump blood in a normal way. Atrial fibrillation may come and go, or it may become a long-lasting problem. If this condition is not treated, it can put you at higher risk for stroke, heart failure, and  other heart problems. What are the causes? This condition may be caused by diseases that damage the heart. They include: High blood pressure. Heart failure. Heart valve disease. Heart surgery. Other causes include: Diabetes. Thyroid disease. Being overweight. Kidney disease. Sometimes the cause is not known. What increases the risk? You are more likely to develop this condition if: You are older. You smoke. You exercise often and very hard. You have a family history of this condition. You are a man. You use drugs. You drink a lot of alcohol. You have lung conditions, such as emphysema, pneumonia, or COPD. You have sleep apnea. What are the signs or symptoms? Common symptoms of this condition include: A feeling that your heart is beating very fast. Chest pain or discomfort. Feeling short of breath. Suddenly feeling light-headed or weak. Getting tired easily during activity. Fainting. Sweating. In some cases, there are no symptoms. How is this treated? Treatment for this condition depends on underlying conditions and how you feel when you have atrial fibrillation. They include: Medicines to: Prevent blood clots. Treat heart rate or heart rhythm problems. Using devices, such as a pacemaker, to correct heart rhythm problems. Doing surgery to remove the part of the heart that sends bad signals. Closing an area where clots can form in the heart (left atrial appendage). In some cases, your doctor will treat other underlying conditions. Follow these instructions at home: Medicines Take over-the-counter and prescription medicines only as told by your doctor. Do not take any new medicines without first talking to your doctor. If you are taking blood  thinners: Talk with your doctor before you take any medicines that have aspirin or NSAIDs, such as ibuprofen, in them. Take your medicine exactly as told by your doctor. Take it at the same time each day. Avoid activities that  could hurt or bruise you. Follow instructions about how to prevent falls. Wear a bracelet that says you are taking blood thinners. Or, carry a card that lists what medicines you take. Lifestyle   Do not use any products that have nicotine or tobacco in them. These include cigarettes, e-cigarettes, and chewing tobacco. If you need help quitting, ask your doctor. Eat heart-healthy foods. Talk with your doctor about the right eating plan for you. Exercise regularly as told by your doctor. Do not drink alcohol. Lose weight if you are overweight. Do not use drugs, including cannabis. General instructions If you have a condition that causes breathing to stop for a short period of time (apnea), treat it as told by your doctor. Keep a healthy weight. Do not use diet pills unless your doctor says they are safe for you. Diet pills may make heart problems worse. Keep all follow-up visits as told by your doctor. This is important. Contact a doctor if: You notice a change in the speed, rhythm, or strength of your heartbeat. You are taking a blood-thinning medicine and you get more bruising. You get tired more easily when you move or exercise. You have a sudden change in weight. Get help right away if:  You have pain in your chest or your belly (abdomen). You have trouble breathing. You have side effects of blood thinners, such as blood in your vomit, poop (stool), or pee (urine), or bleeding that cannot stop. You have any signs of a stroke. "BE FAST" is an easy way to remember the main warning signs: B - Balance. Signs are dizziness, sudden trouble walking, or loss of balance. E - Eyes. Signs are trouble seeing or a change in how you see. F - Face. Signs are sudden weakness or loss of feeling in the face, or the face or eyelid drooping on one side. A - Arms. Signs are weakness or loss of feeling in an arm. This happens suddenly and usually on one side of the body. S - Speech. Signs are sudden trouble  speaking, slurred speech, or trouble understanding what people say. T - Time. Time to call emergency services. Write down what time symptoms started. You have other signs of a stroke, such as: A sudden, very bad headache with no known cause. Feeling like you may vomit (nausea). Vomiting. A seizure. These symptoms may be an emergency. Do not wait to see if the symptoms will go away. Get medical help right away. Call your local emergency services (911 in the [place] Do not drive yourself to the hospital. Summary Atrial fibrillation is a type of heartbeat that is irregular or fast. You are at higher risk of this condition if you smoke, are older, have diabetes, or are overweight. Follow your doctor's instructions about medicines, diet, exercise, and follow-up visits. Get help right away if you have signs or symptoms of a stroke. Get help right away if you cannot catch your breath, or you have chest pain or discomfort. This information is not intended to replace advice given to you by your health care provider. Make sure you discuss any questions you have with your health care provider. Document Revised: 07/12/2018 Document Reviewed: 07/12/2018 Elsevier Patient Education  2022 [employer].

## 2020-11-12 NOTE — Progress Notes (Signed)
Office Visit    Patient Name: Frances Watkins Date of Encounter: 11/12/2020  PCP:  Karilyn Cota, NP   [facility]  Cardiologist:  Thomasene Ripple, DO  Advanced Practice Provider:  No care team member to display Electrophysiologist:  None      Chief Complaint    Frances Watkins is a 55 y.o. female with a hx of PAF, AVNSRT, symptomatic PVC, OA, HTN, psoriatic arthritis presents today for irregular heart beat   Past Medical History    Past Medical History:  Diagnosis Date   Anxiety 09/21/2018   Essential hypertension 09/21/2018   Eustachian tube dysfunction 10/05/2011   GERD (gastroesophageal reflux disease)    Heart murmur    no treatment   History of cholesteatoma 10/05/2011   History of kidney stones    History of stomach ulcers    PAF (paroxysmal atrial fibrillation) (HCC) 04/09/2020   Palpitations 04/09/2020   PONV (postoperative nausea and vomiting)    no problem  03/14/12   Primary hypertension 04/09/2020   Psoriasis    Tachycardia    Past Surgical History:  Procedure Laterality Date   COLONOSCOPY     2014, 2019   HOLMIUM LASER APPLICATION Right 03/23/2012   Procedure: HOLMIUM LASER APPLICATION;  Surgeon: Anner Crete, MD;  Location: [facility];  Service: Urology;  Laterality: Right;   INNER EAR SURGERY  2000   LITHOTRIPSY     x2   TONSILLECTOMY     age 79 yrs.   TYMPANOPLASTY W/ MASTOIDECTOMY     x2   UPPER GI ENDOSCOPY     2014, 2019   URETEROSCOPY  2014    Allergies  Allergies  Allergen Reactions   Sulfa Drugs Cross Reactors Hives    History of Present Illness    Frances Watkins is a 55 y.o. female with a hx of PAF, AVNSRT, symptomatic PVC, OA, HTN, psoriatic arthritis last seen 10/16/20 by Dr. Servando Salina.  Seen in consult by Dr. Servando Salina 04/2020 due to palpitations. She wore a monitor 05/01/20 showing predominantly NSR with average HR 60 bpm. 23 runs of SVT with fastest 12 beats max rate 203 bpm and longest 16.7 seconds with rate  156 bpm. PAC/PVC occurred <1% of time. Atenolol was transitioned to Metoprolol. Echo 04/16/20 frequent PVC, normal LVEF 60-65%, mild LVH, no significant valvular abnormalities.   She was last seen 10/16/20 noting her intermittent flutters or palpitations were much improved. Yesterday she sent watch EKGs showing atrial fibrillation. Dr. [place] as DOD reviewed, indicative of atrial fib. Metoprolol was increased to 50mg  QD. She was recommended for prompt follow up to discuss anticoagulation.   She presents today for follow up. Tells me since starting Toprol 25mg  QD her daily flutters are less. Too much sodium and caffeine seem to instigate her symptoms. Tells me yesterday was a usual day and then at 6 am today she felt her heart racing which woke her from sleeping. Tells me it kept happening until lunchtime. Notes that these is similar to previous episodes of tachycardia and palpitations which are different than what occurred while wearing the monitor. We discussed the pathophysiology and treatment of atrial fibrillation at length.   EKGs/Labs/Other Studies Reviewed:   The following studies were reviewed today:  Echo 04/16/20  1. Frequent PVC's. Left ventricular ejection fraction, by estimation, is  60 to 65%. The left ventricle has normal function. The left ventricle has  no regional wall motion abnormalities. There is  mild concentric left  ventricular hypertrophy. Left  ventricular diastolic parameters were normal.   2. Right ventricular systolic function is normal. The right ventricular  size is normal. There is normal pulmonary artery systolic pressure.   3. The mitral valve is normal in structure. No evidence of mitral valve  regurgitation. No evidence of mitral stenosis.   4. The aortic valve is tricuspid. Aortic valve regurgitation is not  visualized. No aortic stenosis is present.   5. The inferior vena cava is normal in size with greater than 50%  respiratory variability, suggesting right  atrial pressure of 3 mmHg.   EKG:  EKG is ordered today.  The ekg ordered today demonstrates sinus bradycardia 49 bpm with sinus arrhythmia.   Recent Labs: No results found for requested labs within last 8760 hours.  Recent Lipid Panel No results found for: CHOL, TRIG, HDL, CHOLHDL, VLDL, LDLCALC, LDLDIRECT  Risk Assessment/Calculations:   CHA2DS2-VASc Score = 2   This indicates a 2.2% annual risk of stroke. The patient's score is based upon: CHF History: 0 HTN History: 1 Diabetes History: 0 Stroke History: 0 Vascular Disease History: 0 Age Score: 0 Gender Score: 1    Home Medications   Current Meds  Medication Sig   Apremilast 30 MG TABS Take 1 tablet (30 mg total) by mouth in the morning and at bedtime. After completion of starter pack.   escitalopram (LEXAPRO) 10 MG tablet Take 10 mg by mouth daily.   folic acid (FOLVITE) 1 MG tablet Take 2 tablets (2 mg total) by mouth daily.   methotrexate 50 MG/2ML injection Inject 0.8 mL under the skin once weekly.   metoprolol succinate (TOPROL XL) 25 MG 24 hr tablet Take 2 tablets (50 mg total) by mouth daily.   Multiple Vitamin (MULTIVITAMIN WITH MINERALS) TABS tablet Take 1 tablet by mouth daily. Unknown strength   olmesartan (BENICAR) 40 MG tablet Take 40 mg by mouth daily.   TUBERCULIN SYR 1CC/27GX1/2" (B-D TB SYRINGE 1CC/27GX1/2") 27G X 1/2" 1 ML MISC 12 Syringes by Does not apply route once a week. (Patient taking differently: 12 Syringes by Does not apply route once a week. To be used for injectable meds)    Review of Systems      All other systems reviewed and are otherwise negative except as noted above.  Physical Exam    VS:  BP (!) 144/100   Pulse (!) 49   Ht 5\' 3"  (1.6 m)   Wt 181 lb (82.1 kg)   SpO2 96%   BMI 32.06 kg/m  , BMI Body mass index is 32.06 kg/m.  Wt Readings from Last 3 Encounters:  11/12/20 181 lb (82.1 kg)  10/16/20 179 lb 6.4 oz (81.4 kg)  10/07/20 177 lb (80.3 kg)     GEN: Well nourished,  well developed, in no acute distress. HEENT: normal. Neck: Supple, no JVD, carotid bruits, or masses. Cardiac:bradycardia,  RRR, no murmurs, rubs, or gallops. No clubbing, cyanosis, edema.  Radials/PT 2+ and equal bilaterally.  Respiratory:  Respirations regular and unlabored, clear to auscultation bilaterally. GI: Soft, nontender, nondistended. MS: No deformity or atrophy. Skin: Warm and dry, no rash. Neuro:  Strength and sensation are intact. Psych: Normal affect.  Assessment & Plan    PAF / Chronic anticoagulation - EKG strips sent from smart watch consistent with atrial fibrillation. Toprol increased to 50mg  QD by DOD but now with bradycardia. Reduce to Toprol 37.5mg  QD and reassess at follow up. Thankfully asymptomatic with no lightheadedness,  dizziness. Plan for labs including CBC, BMP, TSH at PCP per patient preference. Recent echo 04/2020 with no valvular abnormalities. CHA2DS2-VASc Score = 2 [CHF History: 0, HTN History: 1, Diabetes History: 0, Stroke History: 0, Vascular Disease History: 0, Age Score: 0, Gender Score: 1].  Therefore, the patient's annual risk of stroke is 2.2 %.  Unable to use Eliquis, Xarelto as patient is self-pay and tells me she will not qualify for patient assistance. Will start Coumadin 5mg  QD tomorrow with [facility] appointment in [facility] [place] Tuesday. Coumadin dosing and plan discussed with Saturday, RPH.   HTN - BP elevated. Reports has been well controlled at home. Will monitor at home and send readings. Continue HCTZ 12.5mg  QD. If BP persistently elevated consider ACE/ARB. Heart healthy diet and regular cardiovascular exercise encouraged.  Low salt diet encouraged.   Disposition: Follow up  as scheduled later this month  with Dr. Laural Golden   Signed, Servando Salina, NP 11/12/2020, 3:05 PM [facility]

## 2020-11-18 ENCOUNTER — Other Ambulatory Visit: Payer: Self-pay

## 2020-11-18 ENCOUNTER — Ambulatory Visit (INDEPENDENT_AMBULATORY_CARE_PROVIDER_SITE_OTHER): Payer: Self-pay

## 2020-11-18 DIAGNOSIS — Z5181 Encounter for therapeutic drug level monitoring: Secondary | ICD-10-CM

## 2020-11-18 DIAGNOSIS — I4891 Unspecified atrial fibrillation: Secondary | ICD-10-CM

## 2020-11-18 DIAGNOSIS — Z7901 Long term (current) use of anticoagulants: Secondary | ICD-10-CM

## 2020-11-18 LAB — POCT INR: INR: 1.7 — AB (ref 2.0–3.0)

## 2020-11-18 NOTE — Patient Instructions (Signed)
Description   Take 1.5 tablets today and then continue taking 1 tablet (5mg ) daily. Recheck INR in 1 week.   Coumadin Clinic: [phone] A full discussion of the nature of anticoagulants has been carried out.  A benefit risk analysis has been presented to the patient, so that they understand the justification for choosing anticoagulation at this time. The need for frequent and regular monitoring, precise dosage adjustment and compliance is stressed.  Side effects of potential bleeding are discussed.  The patient should avoid any OTC items containing aspirin or ibuprofen, and should avoid great swings in general diet.  Avoid alcohol consumption.  Call if any signs of abnormal bleeding.

## 2020-11-25 ENCOUNTER — Ambulatory Visit (INDEPENDENT_AMBULATORY_CARE_PROVIDER_SITE_OTHER): Payer: Self-pay

## 2020-11-25 ENCOUNTER — Other Ambulatory Visit: Payer: Self-pay

## 2020-11-25 DIAGNOSIS — Z5181 Encounter for therapeutic drug level monitoring: Secondary | ICD-10-CM

## 2020-11-25 DIAGNOSIS — Z7901 Long term (current) use of anticoagulants: Secondary | ICD-10-CM

## 2020-11-25 DIAGNOSIS — I4891 Unspecified atrial fibrillation: Secondary | ICD-10-CM

## 2020-11-25 LAB — POCT INR: INR: 2.9 (ref 2.0–3.0)

## 2020-11-25 NOTE — Patient Instructions (Signed)
Description   Continue taking 1 tablet (5mg) daily. Recheck INR in 1 week.  Coumadin Clinic [phone]     

## 2020-11-27 ENCOUNTER — Telehealth (INDEPENDENT_AMBULATORY_CARE_PROVIDER_SITE_OTHER): Payer: No Typology Code available for payment source | Admitting: Cardiology

## 2020-11-27 ENCOUNTER — Encounter: Payer: Self-pay | Admitting: Cardiology

## 2020-11-27 VITALS — BP 123/86 | HR 55 | Ht 63.0 in | Wt 179.0 lb

## 2020-11-27 DIAGNOSIS — I10 Essential (primary) hypertension: Secondary | ICD-10-CM

## 2020-11-27 DIAGNOSIS — I493 Ventricular premature depolarization: Secondary | ICD-10-CM

## 2020-11-27 DIAGNOSIS — I471 Supraventricular tachycardia: Secondary | ICD-10-CM

## 2020-11-27 DIAGNOSIS — I480 Paroxysmal atrial fibrillation: Secondary | ICD-10-CM

## 2020-11-27 NOTE — Progress Notes (Signed)
Virtual Visit via Video Note   This visit type was conducted due to national recommendations for restrictions regarding the COVID-19 Pandemic (e.g. social distancing) in an effort to limit this patient's exposure and mitigate transmission in our community.  Due to her co-morbid illnesses, this patient is at least at moderate risk for complications without adequate follow up.  This format is felt to be most appropriate for this patient at this time.  All issues noted in this document were discussed and addressed.  A limited physical exam was performed with this format.  Please refer to the patient's chart for her consent to telehealth for [facility].      Date:  11/27/2020   ID:  Frances Watkins, DOB 10/01/1965, MRN [id number]  Patient Location: Home Provider Location: Office/Clinic  Virtual Visit via Video  Note . I connected with the patient today by a   video enabled telemedicine application and verified that I am speaking with the correct person using two identifiers.  PCP:  Karilyn Cota, NP  Cardiologist:  Thomasene Ripple, DO  Electrophysiologist:  None   Evaluation Performed:  Follow-Up Visit  Chief Complaint:    History of Present Illness:    Frances Watkins is a 55 y.o. female with recently diagnosed atrial fibrillation based on information from her apple watch which time she was in atrial fibrillation rapid ventricular rate, her metoprolol has been increased and she has been placed on Coumadin, symptomatic PVCs, AVNRT, psoriatic arthritis, and osteoarthritis.  I saw the patient for the first time on April 09, 2020 at which time she was concerned given the fact that she had her husband apple watch reported atrial fibrillation and rapid ventricular rate.  She did have some prescription for this.  I placed a monitor on the patient.  She did wear a monitor and she is here today to discuss the result of her monitor as well as her echocardiogram.   I saw the patient on May 29, 2020 at that time we discussed her monitor results.  I stop the atenolol and started the patient on metoprolol succinate 25 mg daily.  She is here today for follow-up visit.  She tells me that she has been doing well.  She offers no complaints at this time.  She tells me the metoprolol is working.   Since I saw the patient she had an episode of palpitations in her watch EKG showed atrial fibrillation.  She tells me during this time she was significantly symptomatic she has some lightheadedness.  Her metoprolol was increased and she was recommended to start anticoagulation.  She was seen by Gillian Shields, NP on November 12, 2020 at that time  The patient does not have symptoms concerning for COVID-19 infection (fever, chills, cough, or new shortness of breath).    Past Medical History:  Diagnosis Date   Anxiety 09/21/2018   Essential hypertension 09/21/2018   Eustachian tube dysfunction 10/05/2011   GERD (gastroesophageal reflux disease)    Heart murmur    no treatment   History of cholesteatoma 10/05/2011   History of kidney stones    History of stomach ulcers    PAF (paroxysmal atrial fibrillation) (HCC) 04/09/2020   Palpitations 04/09/2020   PONV (postoperative nausea and vomiting)    no problem  03/14/12   Primary hypertension 04/09/2020   Psoriasis    Tachycardia    Past Surgical History:  Procedure Laterality Date   COLONOSCOPY     2014, 2019  HOLMIUM LASER APPLICATION Right 03/23/2012   Procedure: HOLMIUM LASER APPLICATION;  Surgeon: Anner Crete, MD;  Location: [facility];  Service: Urology;  Laterality: Right;   INNER EAR SURGERY  2000   LITHOTRIPSY     x2   TONSILLECTOMY     age 78 yrs.   TYMPANOPLASTY W/ MASTOIDECTOMY     x2   UPPER GI ENDOSCOPY     2014, 2019   URETEROSCOPY  2014     Current Meds  Medication Sig   Apremilast 30 MG TABS Take 1 tablet (30 mg total) by mouth in the morning and at bedtime. After completion of starter pack.   escitalopram  (LEXAPRO) 10 MG tablet Take 10 mg by mouth daily.   folic acid (FOLVITE) 1 MG tablet Take 2 tablets (2 mg total) by mouth daily.   methotrexate 50 MG/2ML injection Inject 0.8 mL under the skin once weekly.   metoprolol succinate (TOPROL XL) 25 MG 24 hr tablet Take 1.5 tablets (37.5 mg total) by mouth daily.   Multiple Vitamin (MULTIVITAMIN WITH MINERALS) TABS tablet Take 1 tablet by mouth daily. Unknown strength   olmesartan (BENICAR) 40 MG tablet Take 40 mg by mouth daily.   TUBERCULIN SYR 1CC/27GX1/2" (B-D TB SYRINGE 1CC/27GX1/2") 27G X 1/2" 1 ML MISC 12 Syringes by Does not apply route once a week. (Patient taking differently: 12 Syringes by Does not apply route once a week. To be used for injectable meds)   warfarin (COUMADIN) 5 MG tablet Take 1 tablet (5 mg total) by mouth daily.     Allergies:   Sulfa drugs cross reactors   Social History   Tobacco Use   Smoking status: Never   Smokeless tobacco: Never  Vaping Use   Vaping Use: Never used  Substance Use Topics   Alcohol use: Yes    Comment: occasional   Drug use: No     Family Hx: The patient's family history includes Arthritis in her mother; Cancer in her paternal grandfather; Cirrhosis in her father; Diabetes in her father; Gout in her father; Healthy in her daughter and sister; Heart attack in her father; Heart disease in her father; High Cholesterol in her father; Hypertension in her father; Lymphoma in her maternal grandmother; Psoriasis in her daughter.  ROS:   Please see the history of present illness.     All other systems reviewed and are negative.   Prior CV studies:   The following studies were reviewed today:  Echo 04/16/20  1. Frequent PVC's. Left ventricular ejection fraction, by estimation, is  60 to 65%. The left ventricle has normal function. The left ventricle has  no regional wall motion abnormalities. There is mild concentric left  ventricular hypertrophy. Left  ventricular diastolic parameters were  normal.   2. Right ventricular systolic function is normal. The right ventricular  size is normal. There is normal pulmonary artery systolic pressure.   3. The mitral valve is normal in structure. No evidence of mitral valve  regurgitation. No evidence of mitral stenosis.   4. The aortic valve is tricuspid. Aortic valve regurgitation is not  visualized. No aortic stenosis is present.   5. The inferior vena cava is normal in size with greater than 50%  respiratory variability, suggesting right atrial pressure of 3 mmHg.  Labs/Other Tests and Data Reviewed:    EKG: No EKG to review today.  EKG November 12, 2020 shows sinus bradycardia.  Recent Labs: No results found for requested labs within  last 8760 hours.   Recent Lipid Panel No results found for: CHOL, TRIG, HDL, CHOLHDL, LDLCALC, LDLDIRECT  Wt Readings from Last 3 Encounters:  11/27/20 179 lb (81.2 kg)  11/12/20 181 lb (82.1 kg)  10/16/20 179 lb 6.4 oz (81.4 kg)     Objective:    Vital Signs:  BP 123/86   Pulse (!) 55   Ht 5\' 3"  (1.6 m)   Wt 179 lb (81.2 kg)   BMI 31.71 kg/m    No physical exam  ASSESSMENT & PLAN:    Paroxysmal atrial fibrillation Symptomatic PVC Obesity Hypertension    Paroxysmal atrial fibrillation-she was started on Coumadin for her paroxysmal atrial fibrillation.  Her CHA2DS2-VASc score is 2 with hypertension and female being the second.  Ideally to be started on anticoagulation CHA2DS2-VASc score 4 female should be greater than 3 with female sex and holding strongly.  We will hold on the Coumadin for now giving the lower CHA2DS2-VASc score and the high risk of bleeding.  We will continue to assess her chads Vascor and the need for anticoagulation.  Of note I also spoke with the patient telling her that if she is a candidate for pulmonary vein isolation/A. fib ablation she may be placed back on anticoagulation for at least 4 weeks prior to her procedure.  I will refer the patient to EP for  evaluation for rhythm control strategy (antiarrhythmics versus PVI).  Blood pressure is acceptable, continue with current antihypertensive regimen.  The patient is in agreement with the above plan. The patient left the office in stable condition.  The patient will follow up in  COVID-19 Education: The signs and symptoms of COVID-19 were discussed with the patient and how to seek care for testing (follow up with PCP or arrange E-visit).  The importance of social distancing was discussed today.  Time:   Today, I have spent 15 minutes with the patient with telehealth technology discussing the above problems.     Medication Adjustments/Labs and Tests Ordered: Current medicines are reviewed at length with the patient today.  Concerns regarding medicines are outlined above.   Tests Ordered: No orders of the defined types were placed in this encounter.   Medication Changes: No orders of the defined types were placed in this encounter.   Follow Up:  In Person in 3 month(s)  Signed, , DO  11/27/2020 10:56 AM    [facility]

## 2020-11-27 NOTE — Patient Instructions (Signed)
Medication Instructions:  Your physician recommends that you continue on your current medications as directed. Please refer to the Current Medication list given to you today.  *If you need a refill on your cardiac medications before your next appointment, please call your pharmacy*   Lab Work: None If you have labs (blood work) drawn today and your tests are completely normal, you will receive your results only by: MyChart Message (if you have MyChart) OR A paper copy in the mail If you have any lab test that is abnormal or we need to change your treatment, we will call you to review the results.   Testing/Procedures: None   Follow-Up: At [facility], you and your health needs are our priority.  As part of our continuing mission to provide you with exceptional heart care, we have created designated Provider Care Teams.  These Care Teams include your primary Cardiologist (physician) and Advanced Practice Providers (APPs -  Physician Assistants and Nurse Practitioners) who all work together to provide you with the care you need, when you need it.  We recommend signing up for the patient portal called "MyChart".  Sign up information is provided on this After Visit Summary.  MyChart is used to connect with patients for Virtual Visits (Telemedicine).  Patients are able to view lab/test results, encounter notes, upcoming appointments, etc.  Non-urgent messages can be sent to your provider as well.   To learn more about what you can do with MyChart, go to [email or web address].    Your next appointment:   4 month(s)  The format for your next appointment:   In Person  Provider:   Kardie Tobb, DO [address], [place],  [address]    Other Instructions   

## 2020-12-16 ENCOUNTER — Other Ambulatory Visit: Payer: Self-pay

## 2020-12-16 ENCOUNTER — Ambulatory Visit (INDEPENDENT_AMBULATORY_CARE_PROVIDER_SITE_OTHER): Payer: Self-pay | Admitting: Cardiology

## 2020-12-16 ENCOUNTER — Encounter: Payer: Self-pay | Admitting: Cardiology

## 2020-12-16 VITALS — BP 102/64 | HR 60 | Resp 18 | Ht 63.0 in | Wt 186.0 lb

## 2020-12-16 DIAGNOSIS — I480 Paroxysmal atrial fibrillation: Secondary | ICD-10-CM

## 2020-12-16 MED ORDER — FLECAINIDE ACETATE 50 MG PO TABS: 50.0000 mg | ORAL_TABLET | Freq: Two times a day (BID) | ORAL | 3 refills | Status: DC

## 2020-12-16 NOTE — Patient Instructions (Signed)
Medication Instructions:  Your physician recommends that you continue on your current medications as directed. Please refer to the Current Medication list given to you today.  *If you need a refill on your cardiac medications before your next appointment, please call your pharmacy*   Lab Work: Pre procedure labs :  BMP & CBC You do NOT need to be fasting.  You can stop by the [place] office to have this completed.  If you have labs (blood work) drawn today and your tests are completely normal, you will receive your results only by: MyChart Message (if you have MyChart) OR A paper copy in the mail If you have any lab test that is abnormal or we need to change your treatment, we will call you to review the results.   Testing/Procedures: Please call the office if you would like to proceed with ablation   Follow-Up: At [facility], you and your health needs are our priority.  As part of our continuing mission to provide you with exceptional heart care, we have created designated Provider Care Teams.  These Care Teams include your primary Cardiologist (physician) and Advanced Practice Providers (APPs -  Physician Assistants and Nurse Practitioners) who all work together to provide you with the care you need, when you need it.  Your next appointment:   3 months in [place] ---  you are scheduled for 03/09/2021 @ 3:45 pm   The format for your next appointment:   In Person  Provider:   Dr. Elberta Fortis   Thank you for choosing [facility]!!   Dory Horn, RN [phone]    Other Instructions   Flecainide Tablets What is this medication? FLECAINIDE (FLEK a nide) prevents and treats a fast or irregular heartbeat (arrhythmia). It is often used to treat a type of arrhythmia known as AFib (atrial fibrillation). It works by slowing down overactive electric signals in the heart, which stabilizes your heart rhythm. It belongs to a group of medications called antiarrhythmics. This  medicine may be used for other purposes; ask your health care provider or pharmacist if you have questions. COMMON BRAND NAME(S): Tambocor What should I tell my care team before I take this medication? They need to know if you have any of these conditions: Abnormal levels of potassium in the blood Heart disease including heart rhythm and heart rate problems Kidney or liver disease Recent heart attack An unusual or allergic reaction to flecainide, local anesthetics, other medications, foods, dyes, or preservatives Pregnant or trying to get pregnant Breast-feeding How should I use this medication? Take this medication by mouth with a glass of water. Follow the directions on the prescription label. You can take this medication with or without food. Take your doses at regular intervals. Do not take your medication more often than directed. Do not stop taking this medication suddenly. This may cause serious, heart-related side effects. If your care team wants you to stop the medication, the dose may be slowly lowered over time to avoid any side effects. Talk to your care team regarding the use of this medication in children. While this medication may be prescribed for children as young as 1 year of age for selected conditions, precautions do apply. Overdosage: If you think you have taken too much of this medicine contact a poison control center or emergency room at once. NOTE: This medicine is only for you. Do not share this medicine with others. What if I miss a dose? If you miss a dose, take it as  soon as you can. If it is almost time for your next dose, take only that dose. Do not take double or extra doses. What may interact with this medication? Do not take this medication with any of the following: Amoxapine Arsenic trioxide Certain antibiotics like clarithromycin, erythromycin, gatifloxacin, gemifloxacin, levofloxacin, moxifloxacin, sparfloxacin, or troleandomycin Certain antidepressants  called tricyclic antidepressants like amitriptyline, imipramine, or nortriptyline Certain medications to control heart rhythm like disopyramide, encainide, moricizine, procainamide, propafenone, and quinidine Cisapride Delavirdine Droperidol Haloperidol Haw[place]orn Imatinib Levomethadyl Maprotiline Medications for malaria like chloroquine and halofantrine Pentamidine Phenothiazines like chlorpromazine, mesoridazine, prochlorperazine, thioridazine Pimozide Quinine Ranolazine Ritonavir Sertindole This medication may also interact with the following: Cimetidine Dofetilide Medications for angina or high blood pressure Medications to control heart rhythm like amiodarone and digoxin Ziprasidone This list may not describe all possible interactions. Give your health care provider a list of all the medicines, herbs, non-prescription drugs, or dietary supplements you use. Also tell them if you smoke, drink alcohol, or use illegal drugs. Some items may interact with your medicine. What should I watch for while using this medication? Visit your care team for regular checks on your progress. Because your condition and the use of this medication carries some risk, it is a good idea to carry an identification card, necklace or bracelet with details of your condition, medications, and care team. Check your blood pressure and pulse rate regularly. Ask your care team what your blood pressure and pulse rate should be, and when you should contact them. Your care team also may schedule regular blood tests and electrocardiograms to check your progress. You may get drowsy or dizzy. Do not drive, use machinery, or do anything that needs mental alertness until you know how this medication affects you. Do not stand or sit up quickly, especially if you are an older patient. This reduces the risk of dizzy or fainting spells. Alcohol can make you more dizzy, increase flushing and rapid heartbeats. Avoid alcoholic  drinks. What side effects may I notice from receiving this medication? Side effects that you should report to your care team as soon as possible: Allergic reactions--skin rash, itching, hives, swelling of the face, lips, tongue, or throat Heart failure--shortness of breath, swelling of the ankles, feet, or hands, sudden weight gain, unusual weakness or fatigue Heart rhythm changes--fast or irregular heartbeat, dizziness, feeling faint or lightheaded, chest pain, trouble breathing Liver injury--right upper belly pain, loss of appetite, nausea, light-colored stool, dark yellow or brown urine, yellowing skin or eyes, unusual weakness or fatigue Side effects that usually do not require medical attention (report to your care team if they continue or are bothersome): Blurry vision Constipation Dizziness Fatigue Headache Nausea Tremors or shaking This list may not describe all possible side effects. Call your doctor for medical advice about side effects. You may report side effects to FDA at 1-800-FDA-1088. Where should I keep my medication? Keep out of the reach of children and pets. Store at room temperature between 15 and 30 degrees C (59 and 86 degrees F). Protect from light. Keep container tightly closed. Throw away any unused medication after the expiration date. NOTE: This sheet is a summary. It may not cover all possible information. If you have questions about this medicine, talk to your doctor, pharmacist, or health care provider.  2022 Elsevier/Gold Standard (2020-03-14 00:00:00)   CT INSTRUCTIONS Your cardiac CT will be scheduled at:  [facility] [place] [address] [place], [place] [address] [phone]  Please arrive at the  [employer] main entrance of [facility] 30 minutes prior to test start time. Proceed to the [facility] Radiology Department (first floor) to check-in and test prep.   Please follow these instructions carefully (unless otherwise  directed):  On the Night Before the Test: Be sure to Drink plenty of water. Do not consume any caffeinated/decaffeinated beverages or chocolate 12 hours prior to your test. Do not take any antihistamines 12 hours prior to your test.  On the Day of the Test: Drink plenty of water until 1 hour prior to the test. Do not eat any food 4 hours prior to the test. You may take your regular medications prior to the test.  Take metoprolol (Lopressor) two hours prior to test. HOLD Furosemide/Hydrochlorothiazide morning of the test. FEMALES- please wear underwire-free bra if available       After the Test: Drink plenty of water. After receiving IV contrast, you may experience a mild flushed feeling. This is normal. On occasion, you may experience a mild rash up to 24 hours after the test. This is not dangerous. If this occurs, you can take Benadryl 25 mg and increase your fluid intake. If you experience trouble breathing, this can be serious. If it is severe call 911 IMMEDIATELY. If it is mild, please call our office. If you take any of these medications: Glipizide/Metformin, Avandament, Glucavance, please do not take 48 hours after completing test unless otherwise instructed.   Once we have confirmed authorization from your insurance company, we will call you to set up a date and time for your test. Based on how quickly your insurance processes prior authorizations requests, please allow up to 4 weeks to be contacted for scheduling your Cardiac CT appointment. Be advised that routine Cardiac CT appointments could be scheduled as many as 8 weeks after your provider has ordered it.  For non-scheduling related questions, please contact the cardiac imaging nurse navigator should you have any questions/concerns: Rockwell Alexandria, Cardiac Imaging Nurse Navigator Larey Brick, Cardiac Imaging Nurse Navigator [facility] Direct Office Dial: [phone]   For scheduling needs,  including cancellations and rescheduling, please call [place], [phone].      Electrophysiology/Ablation Procedure Instructions   You are scheduled for a(n)  ablation on ______________ with Dr. Loman Brooklyn.   1.   Pre procedure testing-             A.  LAB WORK --- On _____________ for your pre procedure blood work.  You do NOT need to be fasting.  You can stop by the [place] office to have this completed.   On the day of your procedure ______________ you will go to [facility] hospital [phone] [address]) at ____________.  You will go to the main entrance [address] [employer]) and enter where the [employer] parking staff are.  Your driver will drop you off and you will head down the hallway to ADMITTING.  You may have one support person come in to the hospital with you.  They will be asked to wait in the waiting room. It is OK to have someone drop you off and come back when you are ready to be discharged.   3.   Do not eat or drink after midnight prior to your procedure.   4.   On the morning of your procedure do NOT take any medication. Do not miss any doses of your blood thinner prior to the morning of your procedure or your procedure will need  to be rescheduled.   5.  Plan for an overnight stay but you may be discharged after your procedure, if you use your phone frequently bring your phone charger. If you are discharged after your procedure you will need someone to drive you home and be with you for 24 hours after your procedure.   6. You will follow up with the AFIB clinic 4 weeks after your procedure.  You will follow up with Dr. Elberta Fortis  3 months after your procedure.  These appointments will be made for you.   7. FYI: For your safety, and to allow [place] to monitor your vital signs accurately during the surgery/procedure we request that if you have artificial nails, gel coating, SNS etc. Please have those removed prior to your surgery/procedure. Not having the nail coverings /polish removed  may result in cancellation or delay of your surgery/procedure.  * If you have ANY questions please call the office [phone] and ask for Shjon Lizarraga RN or send me a MyChart message   * Occasionally, EP Studies and ablations can become lengthy.  Please make your family aware of this before your procedure starts.  Average time ranges from 2-8 hours for EP studies/ablations.  Your physician will call your family after the procedure with the results.                                    Cardiac Ablation Cardiac ablation is a procedure to destroy (ablate) some heart tissue that is sending bad signals. These bad signals cause problems in heart rhythm. The heart has many areas that make these signals. If there are problems in these areas, they can make the heart beat in a way that is not normal. Destroying some tissues can help make the heart rhythm normal. Tell your doctor about: Any allergies you have. All medicines you are taking. These include vitamins, herbs, eye drops, creams, and over-the-counter medicines. Any problems you or family members have had with medicines that make you fall asleep (anesthetics). Any blood disorders you have. Any surgeries you have had. Any medical conditions you have, such as kidney failure. Whether you are pregnant or may be pregnant. What are the risks? This is a safe procedure. But problems may occur, including: Infection. Bruising and bleeding. Bleeding into the chest. Stroke or blood clots. Damage to nearby areas of your body. Allergies to medicines or dyes. The need for a pacemaker if the normal system is damaged. Failure of the procedure to treat the problem. What happens before the procedure? Medicines Ask your doctor about: Changing or stopping your normal medicines. This is important. Taking aspirin and ibuprofen. Do not take these medicines unless your doctor tells you to take them. Taking other medicines, vitamins, herbs, and  supplements. General instructions Follow instructions from your doctor about what you cannot eat or drink. Plan to have someone take you home from the hospital or clinic. If you will be going home right after the procedure, plan to have someone with you for 24 hours. Ask your doctor what steps will be taken to prevent infection. What happens during the procedure?  An IV tube will be put into one of your veins. You will be given a medicine to help you relax. The skin on your neck or groin will be numbed. A cut (incision) will be made in your neck or groin. A needle will be put through your cut and  into a large vein. A tube (catheter) will be put into the needle. The tube will be moved to your heart. Dye may be put through the tube. This helps your doctor see your heart. Small devices (electrodes) on the tube will send out signals. A type of energy will be used to destroy some heart tissue. The tube will be taken out. Pressure will be held on your cut. This helps stop bleeding. A bandage will be put over your cut. The exact procedure may vary among doctors and hospitals. What happens after the procedure? You will be watched until you leave the hospital or clinic. This includes checking your heart rate, breathing rate, oxygen, and blood pressure. Your cut will be watched for bleeding. You will need to lie still for a few hours. Do not drive for 24 hours or as long as your doctor tells you. Summary Cardiac ablation is a procedure to destroy some heart tissue. This is done to treat heart rhythm problems. Tell your doctor about any medical conditions you may have. Tell him or her about all medicines you are taking to treat them. This is a safe procedure. But problems may occur. These include infection, bruising, bleeding, and damage to nearby areas of your body. Follow what your doctor tells you about food and drink. You may also be told to change or stop some of your medicines. After the  procedure, do not drive for 24 hours or as long as your doctor tells you. This information is not intended to replace advice given to you by your health care provider. Make sure you discuss any questions you have with your health care provider. Document Revised: 12/21/2018 Document Reviewed: 12/21/2018 Elsevier Patient Education  2022 [employer].

## 2020-12-16 NOTE — Progress Notes (Signed)
Electrophysiology Office Note   Date:  12/16/2020   ID:  Frances Watkins, DOB [birth year], MRN [id number]  PCP:  Karilyn Cota, NP  Cardiologist:  Frances Watkins Primary Electrophysiologist:  Frances Latin Jorja Loa, MD    Chief Complaint: Atrial fibrillation   History of Present Illness: Frances Watkins is a 55 y.o. female who is being seen today for the evaluation of atrial fibrillation at the request of Watkins, Kardie, DO. Presenting today for electrophysiology evaluation.  She has a history significant for atrial fibrillation, hypertension, PVCs, psoriatic arthritis, osteoarthritis.  She was seen in March 2022 by her primary cardiologist with concerns atrial fibrillation.  She wore a monitor which confirmed these findings.  She has symptoms of lightheadedness due to her atrial fibrillation.  She has been having episodes over the last few weeks that have increased in both frequency and timing.  She feels fatigue, lightheadedness, and with palpitations when she has these episodes.  She finds it difficult to do all her daily activities with these occur.  Today, she denies symptoms of palpitations, chest pain, shortness of breath, orthopnea, PND, lower extremity edema, claudication, dizziness, presyncope, syncope, bleeding, or neurologic sequela. The patient is tolerating medications without difficulties.    Past Medical History:  Diagnosis Date   Anxiety 09/21/2018   Essential hypertension 09/21/2018   Eustachian tube dysfunction 10/05/2011   GERD (gastroesophageal reflux disease)    Heart murmur    no treatment   History of cholesteatoma 10/05/2011   History of kidney stones    History of stomach ulcers    PAF (paroxysmal atrial fibrillation) (HCC) 04/09/2020   Palpitations 04/09/2020   PONV (postoperative nausea and vomiting)    no problem  03/14/12   Primary hypertension 04/09/2020   Psoriasis    Tachycardia    Past Surgical History:  Procedure Laterality Date   COLONOSCOPY     2014, 2019    HOLMIUM LASER APPLICATION Right 03/23/2012   Procedure: HOLMIUM LASER APPLICATION;  Surgeon: Anner Crete, MD;  Location: [facility];  Service: Urology;  Laterality: Right;   INNER EAR SURGERY  2000   LITHOTRIPSY     x2   TONSILLECTOMY     age 35 yrs.   TYMPANOPLASTY W/ MASTOIDECTOMY     x2   UPPER GI ENDOSCOPY     2014, 2019   URETEROSCOPY  2014     Current Outpatient Medications  Medication Sig Dispense Refill   Apremilast 30 MG TABS Take 1 tablet (30 mg total) by mouth in the morning and at bedtime. After completion of starter pack. 28 tablet 0   escitalopram (LEXAPRO) 10 MG tablet Take 10 mg by mouth daily.     flecainide (TAMBOCOR) 50 MG tablet Take 1 tablet (50 mg total) by mouth 2 (two) times daily. 60 tablet 3   folic acid (FOLVITE) 1 MG tablet Take 2 tablets (2 mg total) by mouth daily. 180 tablet 3   hydrochlorothiazide (MICROZIDE) 12.5 MG capsule Take 1 capsule (12.5 mg total) by mouth daily. 90 capsule 3   methotrexate 50 MG/2ML injection Inject 0.8 mL under the skin once weekly. 10 mL 0   Metoprolol Tartrate 37.5 MG TABS Take by mouth.     Multiple Vitamin (MULTIVITAMIN WITH MINERALS) TABS tablet Take 1 tablet by mouth daily. Unknown strength     olmesartan (BENICAR) 40 MG tablet Take 40 mg by mouth daily.     TUBERCULIN SYR 1CC/27GX1/2" (B-D TB SYRINGE 1CC/27GX1/2") 27G X 1/2"  1 ML MISC 12 Syringes by Does not apply route once a week. (Patient taking differently: 12 Syringes by Does not apply route once a week. To be used for injectable meds) 12 each 3   warfarin (COUMADIN) 5 MG tablet Take 1 tablet (5 mg total) by mouth daily. (Patient not taking: Reported on 12/16/2020) 30 tablet 2   No current facility-administered medications for this visit.    Allergies:   Sulfa drugs cross reactors   Social History:  The patient  reports that she has never smoked. She has never used smokeless tobacco. She reports current alcohol use. She reports that she does not  use drugs.   Family History:  The patient's family history includes Arthritis in her mother; Cancer in her paternal grandfather; Cirrhosis in her father; Diabetes in her father; Gout in her father; Healthy in her daughter and sister; Heart attack in her father; Heart disease in her father; High Cholesterol in her father; Hypertension in her father; Lymphoma in her maternal grandmother; Psoriasis in her daughter.    ROS:  Please see the history of present illness.   Otherwise, review of systems is positive for none.   All other systems are reviewed and negative.    PHYSICAL EXAM: VS:  BP 102/64   Pulse 60   Resp 18   Ht 5\' 3"  (1.6 m)   Wt 186 lb (84.4 kg)   SpO2 98%   BMI 32.95 kg/m  , BMI Body mass index is 32.95 kg/m. GEN: Well nourished, well developed, in no acute distress  HEENT: normal  Neck: no JVD, carotid bruits, or masses Cardiac: RRR; no murmurs, rubs, or gallops,no edema  Respiratory:  clear to auscultation bilaterally, normal work of breathing GI: soft, nontender, nondistended, + BS MS: no deformity or atrophy  Skin: warm and dry Neuro:  Strength and sensation are intact Psych: euthymic mood, full affect  EKG:  EKG is not ordered today. Personal review of the ekg ordered 11/12/20 shows sinus rhythm, rate 49  Recent Labs: No results found for requested labs within last 8760 hours.    Lipid Panel  No results found for: CHOL, TRIG, HDL, CHOLHDL, VLDL, LDLCALC, LDLDIRECT   Wt Readings from Last 3 Encounters:  12/16/20 186 lb (84.4 kg)  11/27/20 179 lb (81.2 kg)  11/12/20 181 lb (82.1 kg)      Other studies Reviewed: Additional studies/ records that were reviewed today include: TTE 04/16/20  Review of the above records today demonstrates:   1. Frequent PVC's. Left ventricular ejection fraction, by estimation, is  60 to 65%. The left ventricle has normal function. The left ventricle has  no regional wall motion abnormalities. There is mild concentric left   ventricular hypertrophy. Left  ventricular diastolic parameters were normal.   2. Right ventricular systolic function is normal. The right ventricular  size is normal. There is normal pulmonary artery systolic pressure.   3. The mitral valve is normal in structure. No evidence of mitral valve  regurgitation. No evidence of mitral stenosis.   4. The aortic valve is tricuspid. Aortic valve regurgitation is not  visualized. No aortic stenosis is present.   5. The inferior vena cava is normal in size with greater than 50%  respiratory variability, suggesting right atrial pressure of 3 mmHg.   Cardiac monitor 05/02/2020 personally reviewed            1.  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            2.  Rare symptomatic premature ventricular complex. ASSESSMENT AND PLAN:  1.  Paroxysmal atrial fibrillation: Currently on  Toprol-XL 37.5 mg daily.  CHA2DS2-VASc of 2.  We discussed further options for therapy for her atrial fibrillation including flecainide versus ablation.  At this time, she would prefer medication management and she is not insured.  We Destine Ambroise start flecainide 50 mg twice daily.  2.  Hypertension: Currently well controlled  Case discussed with primary cardiology  Current medicines are reviewed at length with the patient today.   The patient does not have concerns regarding her medicines.  The following changes were made today: Start flecainide  Labs/ tests ordered today include:  No orders of the defined types were placed in this encounter.    Disposition:   FU with Taline Nass 3 months  Signed, Naiomy Watters Jorja Loa, MD  12/16/2020 8:49 AM     [facility] [address] [place] [address] [phone] (office) [phone] (fax)

## 2021-01-22 ENCOUNTER — Telehealth: Payer: Self-pay | Admitting: Cardiology

## 2021-01-22 MED ORDER — METOPROLOL TARTRATE 37.5 MG PO TABS: 37.5000 mg | ORAL_TABLET | Freq: Every day | ORAL | 1 refills | Status: DC

## 2021-01-22 NOTE — Telephone Encounter (Signed)
°*  STAT* If patient is at the pharmacy, call can be transferred to refill team.   1. Which medications need to be refilled? (please list name of each medication and dose if known) Metoprolol Tartrate 37.5 MG TABS  2. Which pharmacy/location (including street and city if local pharmacy) is medication to be sent to?[facility] - [place], [place] - [address]  3. Do they need a 30 day or 90 day supply? 90 day   Will be out over the weekend

## 2021-01-22 NOTE — Telephone Encounter (Signed)
Refill sent to pharmacy.   

## 2021-01-23 ENCOUNTER — Other Ambulatory Visit: Payer: Self-pay

## 2021-01-23 MED ORDER — METOPROLOL SUCCINATE ER 25 MG PO TB24: 37.5000 mg | ORAL_TABLET | Freq: Every day | ORAL | 3 refills | Status: DC

## 2021-01-23 NOTE — Telephone Encounter (Signed)
Spoke with Morrie Sheldon from [employer]. She is made aware the wrong medication was called in for the pt. Right medication sent into the pharmacy for the pt. Morrie Sheldon will contact pt to make her aware of the correction.

## 2021-01-23 NOTE — Telephone Encounter (Signed)
They are calling to question of patient should be on extended release or tartrate form, patient was only aware of change of dosage.

## 2021-01-29 ENCOUNTER — Telehealth: Payer: Self-pay | Admitting: Rheumatology

## 2021-01-29 NOTE — Telephone Encounter (Signed)
Patient called the office stating she was given samples of Otezla by Dr. Corliss Skains and she will run out of medication before her next appointment on 03/10/21. Patient would like to pick up more samples if possible.

## 2021-01-29 NOTE — Telephone Encounter (Signed)
Patient advised we have samples she can by the office to pick up. Patient advised she is also due for labs. Patient states she will call her PCP to have them drawn. Samples reserved in the cabinet for patient.

## 2021-02-05 NOTE — Telephone Encounter (Signed)
Medication Samples have been provided to the patient.  Drug name: Otezla       Strength: 30mg        Qty: 2  LOT: [id number]  Exp.Date: 07/01/2022  Dosing instructions: Take 1 tablet by mouth twice daily.  

## 2021-02-24 NOTE — Progress Notes (Signed)
Office Visit Note  Patient: Frances Watkins             Date of Birth: 03-31-1965           MRN: [id number]             PCP: Esperanza Richters, NP Referring: Esperanza Richters, NP Visit Date: 03/10/2021 Occupation: @GUAROCC @  Subjective:  Medication monitoring   History of Present Illness: [name] is a 56 y.o. female with history of psoriatic arthritis.  She is currently taking otezla 30 mg 1 tablet by mouth daily and methotrexate 0.8 ml sq injections once weekly.  She is tolerating these medications without any side effects and has not missed any doses recently.  She states that she has psoriatic arthritis flares about every 3 months.  The flares are typically exacerbated by overuse activities or stress.  She has some ongoing Achilles tendinitis of the left foot.  She denies any plantar fasciitis.  She has occasional discomfort in her SI joints.  Her pain and stiffness is typically most severe in both ankles and both feet.  She has a few small patches of psoriasis behind the left ear but does not have any new patches.  She has not had any recent infections.  She states that since her last office visit she was diagnosed with atrial fibrillation.  She is currently taking metoprolol and flecainide as prescribed.      Activities of Daily Living:  Patient reports morning stiffness for 20 minutes.   Patient Denies nocturnal pain.  Difficulty dressing/grooming: Denies Difficulty climbing stairs: Denies Difficulty getting out of chair: Denies Difficulty using hands for taps, buttons, cutlery, and/or writing: Denies  Review of Systems  Constitutional:  Negative for fatigue.  HENT:  Negative for mouth dryness.   Eyes:  Negative for dryness.  Respiratory:  Negative for shortness of breath.   Cardiovascular:  Negative for swelling in legs/feet.  Gastrointestinal:  Negative for constipation.  Endocrine: Negative for excessive thirst.  Genitourinary:  Negative for difficulty urinating.   Musculoskeletal:  Positive for joint pain, joint pain, joint swelling and morning stiffness.  Skin:  Positive for rash.  Allergic/Immunologic: Negative for susceptible to infections.  Neurological:  Negative for numbness.  Hematological:  Negative for bruising/bleeding tendency.  Psychiatric/Behavioral:  Negative for sleep disturbance.    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   Hypertension 05/29/2020   History of stomach ulcers    PONV (postoperative nausea and vomiting)    Tachycardia    Palpitations 04/09/2020   Primary hypertension 04/09/2020   GERD (gastroesophageal reflux disease)    Heart murmur    Psoriasis 09/21/2018   Anxiety 09/21/2018   Essential hypertension 09/21/2018   History of kidney stones 09/21/2018   Eustachian tube dysfunction 10/05/2011   History of cholesteatoma 10/05/2011    Past Medical History:  Diagnosis Date   Anxiety 09/21/2018   Essential hypertension 09/21/2018   Eustachian tube dysfunction 10/05/2011   GERD (gastroesophageal reflux disease)    Heart murmur    no treatment   History of cholesteatoma 10/05/2011   History of kidney stones    History of stomach ulcers    PAF (paroxysmal atrial fibrillation) ([place]) 04/09/2020   Palpitations 04/09/2020  PONV (postoperative nausea and vomiting)    no problem  03/14/12   Primary hypertension 04/09/2020   Psoriasis    Tachycardia     Family History  Problem Relation Age of Onset   Arthritis Mother    Diabetes Father    Hypertension Father    High Cholesterol Father    Heart disease Father    Heart attack Father    Gout Father    Cirrhosis Father    Healthy Sister    Psoriasis Daughter        psoriatic arthritis   Healthy Daughter     Lymphoma Maternal Grandmother    Cancer Paternal Grandfather    Past Surgical History:  Procedure Laterality Date   COLONOSCOPY     2014, 2019   HOLMIUM LASER APPLICATION Right [id number]   Procedure: HOLMIUM LASER APPLICATION;  Surgeon: Malka So, MD;  Location: [facility];  Service: Urology;  Laterality: Right;   INNER EAR SURGERY  2000   LITHOTRIPSY     x2   TONSILLECTOMY     age 29 yrs.   TYMPANOPLASTY W/ MASTOIDECTOMY     x2   UPPER GI ENDOSCOPY     2014, 2019   URETEROSCOPY  2014   Social History   Social History Narrative   Not on file   Immunization History  Administered Date(s) Administered   PFIZER(Purple Top)SARS-COV-2 Vaccination 04/05/2019, 04/24/2019, 10/05/2019   Pneumococcal Conjugate-13 09/27/2018     Objective: Vital Signs: BP (!) 162/88 (BP Location: Left Arm, Patient Position: Sitting, Cuff Size: Normal)    Pulse (!) 48    Resp 16    Ht 5\' 3"  (1.6 m)    Wt 190 lb (86.2 kg)    BMI 33.66 kg/m    Physical Exam Vitals and nursing note reviewed.  Constitutional:      Appearance: She is well-developed.  HENT:     Head: Normocephalic and atraumatic.  Eyes:     Conjunctiva/sclera: Conjunctivae normal.  Pulmonary:     Effort: Pulmonary effort is normal.  Abdominal:     Palpations: Abdomen is soft.  Musculoskeletal:     Cervical back: Normal range of motion.  Skin:    General: Skin is warm and dry.     Capillary Refill: Capillary refill takes less than 2 seconds.  Neurological:     Mental Status: She is alert and oriented to person, place, and time.  Psychiatric:        Behavior: Behavior normal.     Musculoskeletal Exam: C-spine, thoracic spine, and lumbar spine good ROM.  No SI joint tenderness upon palpation.  Shoulder joints, elbow joints, wrist joints, MCPs, PIPs, and DIPs good ROM with no synovitis.  Complete fist formation bilaterally.  Hip joints have good range of motion with no groin pain.  Knee joints have good range  of motion with no warmth or effusion.  Ankle joints have good range of motion with no tenderness or inflammation.  Tenderness and inflammation along the left Achilles tendon noted.  No tenderness along the plantar fascia.  No tenderness over MTP joints.  CDAI Exam: CDAI Score: -- Patient Global: --; Provider Global: -- Swollen: --; Tender: -- Joint Exam 03/10/2021   No joint exam has been documented for this visit   There is currently no information documented on the homunculus. Go to the Rheumatology activity and complete the homunculus joint exam.  Investigation: No additional findings.  Imaging: No results found.  Recent Labs: Lab  Results  Component Value Date   WBC 5.6 05/04/2019   HGB 13.1 05/04/2019   PLT 289 05/04/2019   NA 141 06/19/2019   K 4.1 06/19/2019   CL 106 06/19/2019   CO2 30 06/19/2019   GLUCOSE 93 06/19/2019   BUN 13 06/19/2019   CREATININE 0.72 06/19/2019   BILITOT 0.5 06/19/2019   ALKPHOS 73 01/30/2019   AST 17 06/19/2019   ALT 14 06/19/2019   PROT 6.6 06/19/2019   ALBUMIN 4.5 01/30/2019   CALCIUM 8.9 06/19/2019   GFRAA 112 06/19/2019    Speciality Comments: No specialty comments available.  Procedures:  No procedures performed Allergies: Sulfa drugs cross reactors    Assessment / Plan:     Visit Diagnoses: Psoriatic arthritis ([place]): No synovitis or dactylitis was noted on examination today.  She continues to experience episodic pain and intermittent inflammation in both ankle joints and both feet.  On examination today mild enthesitis noted at the left Achilles tendon insertion site was noted.  She had no evidence of plantar fasciitis at this time.  She has occasional SI joint discomfort but had no tenderness upon palpation today.  She has a few small patches of psoriasis behind the left ear which are unchanged.  Overall her psoriatic arthritis has been stable on Otezla 30 mg 1 tablet by mouth twice daily and methotrexate 0.8 mL sq injections once  weekly.  She is tolerating both medications without any side effects and has not missed any doses recently.  According to the patient she typically flares every 2 to 3 months and typically her flares are triggered by overuse/strenuous activities.  She has not had any change of insurance so her medication options are limited.  She does not want to make any medication changes at this time.  She will remain on Otezla and methotrexate as combination therapy.  She was advised to notify us if she develops more frequent or severe flares.  She will follow-up in the office in 5 months.  Psoriasis: She has a few small patches of psoriasis behind the left ear.  She has not developed any new patches while taking Otezla and methotrexate as combination.  She was advised to notify us if she develops any new or worsening symptoms.  High risk medication use - Otezla 30 mg 1 tablet by mouth twice daily, Methotrexate 0.8 ml sq injections once weekly and folic acid 2 mg po daily. CBC and CMP updated on 01/31/2021.  CBC within normal limits.  AST was borderline elevated 44 and ALT was within normal limits.  GFR was 99.  She occasionally drinks alcohol.  Patient was advised to avoid Tylenol, NSAIDs, and alcohol use while taking methotrexate.  She voiced understanding.  She will have updated lab work in March and every 3 months to monitor for drug toxicity. She has not had any recent infections.  Discussed the importance of holding methotrexate until her infection has completely cleared.   Pain in thoracic spine: No midline spinal tenderness.    Pes cavus:  Unchanged.  No evidence of plantar fasciitis at this time.  Discussed the importance of wearing proper fitting shoes.  Plantar fasciitis, bilateral: Resolved  Achilles tendinitis, left leg: Improved.  Tenderness and inflammation along the left Achilles tendon insertion site was noted on examination today.  She experiences intermittent pain and swelling.  Overall her  flares have been less frequent and less severe.  Discussed that [place] Nail should help with enthesitis but it tends to be refractory to  treatment.  She will likely require more aggressive therapy in the future.  She was advised to notify us if she continues to have recurrent flares.  Chronic SI joint pain: Left-She has occasional left SI joint discomfort.  She is advised to notify us if she develops more frequent or severe episodes.  Other medical conditions are listed as follows:   Essential hypertension  History of kidney stones  Solar elastosis  Anxiety  PVC's (premature ventricular contractions) - Followed by Dr. Harriet Masson.  She had an echocardiogram performed on 04/16/2020 which revealed mild left ventricular thickening.    PAF (paroxysmal atrial fibrillation) ([place]): She has been diagnosed with atrial fibrillation.  She is taking metoprolol and flecainide as prescribed.    Orders: No orders of the defined types were placed in this encounter.  No orders of the defined types were placed in this encounter.   Follow-Up Instructions: Return in about 5 months (around 08/07/2021) for Psoriatic arthritis.   Ofilia Neas, PA-C  Note - This record has been created using Dragon software.  Chart creation errors have been sought, but may not always  have been located. Such creation errors do not reflect on  the standard of medical care.

## 2021-03-03 ENCOUNTER — Ambulatory Visit: Payer: Self-pay | Admitting: Cardiology

## 2021-03-05 ENCOUNTER — Other Ambulatory Visit: Payer: Self-pay | Admitting: *Deleted

## 2021-03-05 DIAGNOSIS — L4050 Arthropathic psoriasis, unspecified: Secondary | ICD-10-CM

## 2021-03-05 MED ORDER — METHOTREXATE SODIUM CHEMO INJECTION 50 MG/2ML: INTRAMUSCULAR | 0 refills | Status: DC

## 2021-03-05 NOTE — Telephone Encounter (Signed)
Refill request received via fax  Next Visit: 03/10/2021  Last Visit: 10/07/2020  Last Fill: 10/23/2020  DX: Psoriatic arthritis   Current Dose per office note 10/07/2020: Methotrexate 0.8 ml sq injections once weekly   Labs: 10/15/2020 WNL  Patient to update labs at upcoming appointment on 03/10/2021  Okay to refill MTX?

## 2021-03-09 ENCOUNTER — Ambulatory Visit: Payer: Self-pay | Admitting: Cardiology

## 2021-03-10 ENCOUNTER — Encounter: Payer: Self-pay | Admitting: Physician Assistant

## 2021-03-10 ENCOUNTER — Ambulatory Visit (INDEPENDENT_AMBULATORY_CARE_PROVIDER_SITE_OTHER): Payer: Self-pay | Admitting: Physician Assistant

## 2021-03-10 ENCOUNTER — Other Ambulatory Visit: Payer: Self-pay

## 2021-03-10 VITALS — BP 162/88 | HR 48 | Resp 16 | Ht 63.0 in | Wt 190.0 lb

## 2021-03-10 DIAGNOSIS — I480 Paroxysmal atrial fibrillation: Secondary | ICD-10-CM

## 2021-03-10 DIAGNOSIS — Q667 Congenital pes cavus, unspecified foot: Secondary | ICD-10-CM

## 2021-03-10 DIAGNOSIS — M722 Plantar fascial fibromatosis: Secondary | ICD-10-CM

## 2021-03-10 DIAGNOSIS — M546 Pain in thoracic spine: Secondary | ICD-10-CM

## 2021-03-10 DIAGNOSIS — I10 Essential (primary) hypertension: Secondary | ICD-10-CM

## 2021-03-10 DIAGNOSIS — L578 Other skin changes due to chronic exposure to nonionizing radiation: Secondary | ICD-10-CM

## 2021-03-10 DIAGNOSIS — M533 Sacrococcygeal disorders, not elsewhere classified: Secondary | ICD-10-CM

## 2021-03-10 DIAGNOSIS — L409 Psoriasis, unspecified: Secondary | ICD-10-CM

## 2021-03-10 DIAGNOSIS — I493 Ventricular premature depolarization: Secondary | ICD-10-CM

## 2021-03-10 DIAGNOSIS — Z79899 Other long term (current) drug therapy: Secondary | ICD-10-CM

## 2021-03-10 DIAGNOSIS — L4050 Arthropathic psoriasis, unspecified: Secondary | ICD-10-CM

## 2021-03-10 DIAGNOSIS — M7662 Achilles tendinitis, left leg: Secondary | ICD-10-CM

## 2021-03-10 DIAGNOSIS — Z87442 Personal history of urinary calculi: Secondary | ICD-10-CM

## 2021-03-10 DIAGNOSIS — F419 Anxiety disorder, unspecified: Secondary | ICD-10-CM

## 2021-03-10 DIAGNOSIS — G8929 Other chronic pain: Secondary | ICD-10-CM

## 2021-03-10 NOTE — Patient Instructions (Signed)
Standing Labs °We placed an order today for your standing lab work.  ° °Please have your standing labs drawn in March and every 3 months  ° °If possible, please have your labs drawn 2 weeks prior to your appointment so that the provider can discuss your results at your appointment. ° °Please note that you may see your imaging and lab results in MyChart before we have reviewed them. °We may be awaiting multiple results to interpret others before contacting you. °Please allow our office up to 72 hours to thoroughly review all of the results before contacting the office for clarification of your results. ° °We have open lab daily: °Monday through Thursday from 1:30-4:30 PM and Friday from 1:30-4:00 PM °at the office of Dr. Shaili Deveshwar, [facility].   °Please be advised, all patients with office appointments requiring lab work will take precedent over walk-in lab work.  °If possible, please come for your lab work on Monday and Friday afternoons, as you may experience shorter wait times. °The office is located at [address], [address], [place],  [address] °No appointment is necessary.   °Labs are drawn by [facility]. Please bring your co-pay at the time of your lab draw.  You may receive a bill from [facility] for your lab work. ° °Please note if you are on Hydroxychloroquine and and an order has been placed for a Hydroxychloroquine level, you will need to have it drawn 4 hours or more after your last dose. ° °If you wish to have your labs drawn at another location, please call the office 24 hours in advance to send orders. ° °If you have any questions regarding directions or hours of operation,  °please call [phone].   °As a reminder, please drink plenty of water prior to coming for your lab work. Thanks! ° °

## 2021-03-10 NOTE — Progress Notes (Signed)
Medication Samples have been provided to the patient.  Drug name: [place]       Strength: 30mg         Qty: 2 boxes  LOT:  Exp.Date: 07/01/2022  Dosing instructions: Take 1 tablet by mouth twice daily.

## 2021-04-03 ENCOUNTER — Other Ambulatory Visit: Payer: Self-pay | Admitting: Cardiology

## 2021-04-06 ENCOUNTER — Telehealth: Payer: Self-pay | Admitting: Rheumatology

## 2021-04-06 ENCOUNTER — Other Ambulatory Visit: Payer: Self-pay | Admitting: Rheumatology

## 2021-04-06 DIAGNOSIS — L4050 Arthropathic psoriasis, unspecified: Secondary | ICD-10-CM

## 2021-04-06 NOTE — Telephone Encounter (Signed)
I spoke with patient and advised that she may come by the office to pick up a sample. Sample reserved in cabinet for patient.  ?

## 2021-04-06 NOTE — Telephone Encounter (Signed)
Patient called the office stating she is taking Frances Watkins. Patient states she gets samples from our office and would like to pick up another one.  ?

## 2021-04-06 NOTE — Telephone Encounter (Signed)
Next Visit: 08/11/2021 ? ?Last Visit: 03/10/2021 ? ?Last Fill: 01/23/2020 ? ?Dx: Psoriatic arthritis  ? ?Current Dose per office note on 03/10/2021:  folic acid 2 mg po daily ? ?Okay to refill Folic Acid?   ?

## 2021-04-07 NOTE — Telephone Encounter (Signed)
Medication Samples have been provided to the patient.  Drug name: Otezla       Strength: 30 mg        Qty: 2  LOT: [id number]  Exp.Date: 07/01/2022  Dosing instructions: Take one tablet by mouth twice daily.   

## 2021-04-11 ENCOUNTER — Other Ambulatory Visit: Payer: Self-pay | Admitting: Cardiology

## 2021-05-01 ENCOUNTER — Telehealth: Payer: Self-pay | Admitting: *Deleted

## 2021-05-01 NOTE — Telephone Encounter (Signed)
Labs received from:[facility] ? ?Drawn on:04/29/2021 ? ?Reviewed by:Sherron Ales, PA-C ? ?Labs drawn:Lipid Panel, CBC, CMP ? ?Results:Non HDL Cholesterol 145 ? Glucose 106 ? ?Patient is on MTX 0.8 mL SQ weekly and Otezla 30 mg po BID.   ?

## 2021-05-04 ENCOUNTER — Telehealth: Payer: Self-pay | Admitting: Rheumatology

## 2021-05-04 NOTE — Telephone Encounter (Signed)
Patient called the office requesting to pick up a sample of Otezla.  ?

## 2021-05-04 NOTE — Telephone Encounter (Signed)
Patient advised she may come by the office to pick up samples. Patient states she will come first thing in the morning to pick them up.  ?

## 2021-05-05 NOTE — Telephone Encounter (Signed)
Medication Samples have been provided to the patient. ? ?Drug name: Henderson [place]      Strength: 30 mg        Qty: 2 LOT: [id number] Exp.Date: 06/30/2021 ? ?Dosing instructions: Take one tablet by mouth twice daily.   ?

## 2021-05-06 ENCOUNTER — Other Ambulatory Visit: Payer: Self-pay | Admitting: *Deleted

## 2021-05-06 DIAGNOSIS — L4050 Arthropathic psoriasis, unspecified: Secondary | ICD-10-CM

## 2021-05-06 MED ORDER — METHOTREXATE SODIUM CHEMO INJECTION 50 MG/2ML: INTRAMUSCULAR | 0 refills | Status: DC

## 2021-05-06 NOTE — Telephone Encounter (Signed)
Refill request received via fax ? ?Next Visit: 08/11/2021 ? ?Last Visit: 03/10/2021 ? ?Last Fill: 03/05/2021 ? ?DX: Psoriatic arthritis  ? ?Current Dose per office note 03/10/2021:  methotrexate 0.8 mL sq injections once weekly.  ? ?Labs: 04/29/2021 Glucose 106 ? ?Okay to refill MTX?  ?

## 2021-06-02 ENCOUNTER — Telehealth: Payer: Self-pay | Admitting: Rheumatology

## 2021-06-02 NOTE — Telephone Encounter (Signed)
Patient called the office requesting a sample of Otezla. Patient states she normally gets samples of her medication. ?

## 2021-06-02 NOTE — Telephone Encounter (Signed)
Sample reserved in the cabinet for patient. Called patient and advised she may come by the office to pick up sample.  ?

## 2021-06-04 NOTE — Telephone Encounter (Signed)
Medication Samples have been provided to the patient. ? ?Drug name: Henderson [place] tab       Strength: 30mg         Qty: 56 tab (2 packs)  LOT:  Exp.Date: 10/02/22 ? ?Dosing instructions: take one tablet by mouth twice daily ? ?The patient has been instructed regarding the correct time, dose, and frequency of taking this medication, including desired effects and most common side effects.  ? ?10/04/22 ?8:18 AM ?06/04/2021  ?

## 2021-06-08 ENCOUNTER — Ambulatory Visit (INDEPENDENT_AMBULATORY_CARE_PROVIDER_SITE_OTHER): Payer: Self-pay | Admitting: Cardiology

## 2021-06-08 ENCOUNTER — Encounter: Payer: Self-pay | Admitting: Cardiology

## 2021-06-08 VITALS — BP 146/94 | HR 57 | Ht 63.5 in | Wt 191.8 lb

## 2021-06-08 DIAGNOSIS — I480 Paroxysmal atrial fibrillation: Secondary | ICD-10-CM

## 2021-06-08 NOTE — Patient Instructions (Signed)
Medication Instructions:  Your physician recommends that you continue on your current medications as directed. Please refer to the Current Medication list given to you today.  *If you need a refill on your cardiac medications before your next appointment, please call your pharmacy*   Lab Work: None ordered   Testing/Procedures: None ordered   Follow-Up: At [facility], you and your health needs are our priority.  As part of our continuing mission to provide you with exceptional heart care, we have created designated Provider Care Teams.  These Care Teams include your primary Cardiologist (physician) and Advanced Practice Providers (APPs -  Physician Assistants and Nurse Practitioners) who all work together to provide you with the care you need, when you need it.  Your next appointment:   6 month(s)  The format for your next appointment:   In Person  Provider:   Will Camnitz, MD    Thank you for choosing [facility]!!   Marvina Danner, RN [phone]  Other Instructions   Important Information About Sugar           

## 2021-06-08 NOTE — Progress Notes (Signed)
? ?Electrophysiology Office Note ? ? ?Date:  06/08/2021  ? ?ID:  Frances Watkins, DOB [birth year], MRN [id number] ? ?PCP:  Esperanza Richters, NP  ?Cardiologist:  [place] ?Primary Electrophysiologist:  Mihail Prettyman Meredith Leeds, MD   ? ?Chief Complaint: Atrial fibrillation ?  ?History of Present Illness: ?Frances Watkins is a 56 y.o. female who is being seen today for the evaluation of atrial fibrillation at the request of Wilburn, Nita Sells, NP. Presenting today for electrophysiology evaluation. ? ?He has a history significant for atrial fibrillation, hypertension, PVCs, psoriatic arthritis, osteoarthritis.  She was seen March 2022 by her primary cardiologist with concerns of atrial fibrillation.  She wore a cardiac monitor that confirmed episodes of atrial fibrillation.  She has had increased frequency and timing of her episodes.  She was started on flecainide at her last visit. ? ?Today, denies symptoms of palpitations, chest pain, shortness of breath, orthopnea, PND, lower extremity edema, claudication, dizziness, presyncope, syncope, bleeding, or neurologic sequela. The patient is tolerating medications without difficulties.  Since being seen she has done well.  She has no chest pain or shortness of breath.  She review all of her daily activities without restriction.  She is overall quite comfortable with her control. ? ? ?Past Medical History:  ?Diagnosis Date  ? Anxiety 09/21/2018  ? Essential hypertension 09/21/2018  ? Eustachian tube dysfunction 10/05/2011  ? GERD (gastroesophageal reflux disease)   ? Heart murmur   ? no treatment  ? History of cholesteatoma 10/05/2011  ? History of kidney stones   ? History of stomach ulcers   ? PAF (paroxysmal atrial fibrillation) ([place]) 04/09/2020  ? Palpitations 04/09/2020  ? PONV (postoperative nausea and vomiting)   ? no problem  03/14/12  ? Primary hypertension 04/09/2020  ? Psoriasis   ? Tachycardia   ? ?Past Surgical History:  ?Procedure Laterality Date  ? COLONOSCOPY    ? 2014, 2019  ? HOLMIUM  LASER APPLICATION Right [id number]  ? Procedure: HOLMIUM LASER APPLICATION;  Surgeon: Malka So, MD;  Location: [facility];  Service: Urology;  Laterality: Right;  ? INNER EAR SURGERY  2000  ? LITHOTRIPSY    ? x2  ? TONSILLECTOMY    ? age 33 yrs.  ? TYMPANOPLASTY W/ MASTOIDECTOMY    ? x2  ? UPPER GI ENDOSCOPY    ? 2014, 2019  ? URETEROSCOPY  2014  ? ? ? ?Current Outpatient Medications  ?Medication Sig Dispense Refill  ? Apremilast 30 MG TABS Take 1 tablet (30 mg total) by mouth in the morning and at bedtime. After completion of starter pack. 28 tablet 0  ? escitalopram (LEXAPRO) 10 MG tablet Take 10 mg by mouth daily.    ? flecainide (TAMBOCOR) 50 MG tablet TAKE 1 TABLET BY MOUTH 2 TIMES DAILY. 60 tablet 8  ? folic acid (FOLVITE) 1 MG tablet TAKE 2 TABLETS BY MOUTH DAILY 180 tablet 3  ? hydrochlorothiazide (MICROZIDE) 12.5 MG capsule TAKE 1 CAPSULE BY MOUTH DAILY. 90 capsule 1  ? methotrexate 50 MG/2ML injection Inject 0.8 mL under the skin once weekly. 10 mL 0  ? metoprolol succinate (TOPROL XL) 25 MG 24 hr tablet Take 1.5 tablets (37.5 mg total) by mouth daily. 135 tablet 3  ? Multiple Vitamin (MULTIVITAMIN WITH MINERALS) TABS tablet Take 1 tablet by mouth daily. Unknown strength    ? olmesartan (BENICAR) 40 MG tablet Take 40 mg by mouth daily.    ? TUBERCULIN SYR 1CC/27GX1/2" (B-D TB SYRINGE  1CC/27GX1/2") 27G X 1/2" 1 ML MISC 12 Syringes by Does not apply route once a week. (Patient taking differently: 12 Syringes by Does not apply route once a week. To be used for injectable meds) 12 each 3  ? ?No current facility-administered medications for this visit.  ? ? ?Allergies:   Sulfa drugs cross reactors  ? ?Social History:  The patient  reports that she has never smoked. She has never used smokeless tobacco. She reports current alcohol use. She reports that she does not use drugs.  ? ?Family History:  The patient's family history includes Arthritis in her mother; Cancer in her paternal grandfather;  Cirrhosis in her father; Diabetes in her father; Gout in her father; Healthy in her daughter and sister; Heart attack in her father; Heart disease in her father; High Cholesterol in her father; Hypertension in her father; Lymphoma in her maternal grandmother; Psoriasis in her daughter.  ? ?ROS:  Please see the history of present illness.   Otherwise, review of systems is positive for none.   All other systems are reviewed and negative.  ? ?PHYSICAL EXAM: ?VS:  BP (!) 146/94   Pulse (!) 57   Ht 5' 3.5" (1.613 m)   Wt 191 lb 12.8 oz (87 kg)   SpO2 97%   BMI 33.44 kg/m?  , BMI Body mass index is 33.44 kg/m?. ?GEN: Well nourished, well developed, in no acute distress  ?HEENT: normal  ?Neck: no JVD, carotid bruits, or masses ?Cardiac: RRR; no murmurs, rubs, or gallops,no edema  ?Respiratory:  clear to auscultation bilaterally, normal work of breathing ?GI: soft, nontender, nondistended, + BS ?MS: no deformity or atrophy  ?Skin: warm and dry ?Neuro:  Strength and sensation are intact ?Psych: euthymic mood, full affect ? ?EKG:  EKG is ordered today. ?Personal review of the ekg ordered shows sinus rhythm, rate 57  ? ?Recent Labs: ?No results found for requested labs within last 8760 hours.  ? ? ?Lipid Panel  ?No results found for: CHOL, TRIG, HDL, CHOLHDL, VLDL, LDLCALC, LDLDIRECT ? ? ?Wt Readings from Last 3 Encounters:  ?06/08/21 191 lb 12.8 oz (87 kg)  ?03/10/21 190 lb (86.2 kg)  ?12/16/20 186 lb (84.4 kg)  ?  ? ? ?Other studies Reviewed: ?Additional studies/ records that were reviewed today include: TTE 04/16/20  ?Review of the above records today demonstrates:  ? 1. Frequent PVC's. Left ventricular ejection fraction, by estimation, is  ?60 to 65%. The left ventricle has normal function. The left ventricle has  ?no regional wall motion abnormalities. There is mild concentric left  ?ventricular hypertrophy. Left  ?ventricular diastolic parameters were normal.  ? 2. Right ventricular systolic function is normal. The  right ventricular  ?size is normal. There is normal pulmonary artery systolic pressure.  ? 3. The mitral valve is normal in structure. No evidence of mitral valve  ?regurgitation. No evidence of mitral stenosis.  ? 4. The aortic valve is tricuspid. Aortic valve regurgitation is not  ?visualized. No aortic stenosis is present.  ? 5. The inferior vena cava is normal in size with greater than 50%  ?respiratory variability, suggesting right atrial pressure of 3 mmHg.  ? ?Cardiac monitor 05/02/2020 personally reviewed ?           1.  Paroxysmal symptomatic supraventricular tachycardia (one episode that appear to be AVNRT with multiple others appears to be atrial tachycardia). ?           2.  Rare symptomatic premature ventricular complex. ? ?  ASSESSMENT AND PLAN: ? ?1.  Paroxysmal atrial fibrillation: Currently on Toprol-XL 37.5 mg daily.  CHA2DS2-VASc of 2.  Currently on flecainide 50 mg twice daily.  High risk medication monitoring for flecainide, ECG remained stable.  She remains in sinus rhythm.  No changes at this time. ? ?2.  Hypertension: Currently well controlled ? ?Current medicines are reviewed at length with the patient today.   ?The patient does not have concerns regarding her medicines.  The following changes were made today: None ? ?Labs/ tests ordered today include:  ?Orders Placed This Encounter  ?Procedures  ? EKG 12-Lead  ? ? ? ? ?Disposition:   FU with Jing Howatt 6 months ? ?Signed, ?Arrielle Mcginn Meredith Leeds, MD  ?06/08/2021 4:07 PM    ? ?[facility] ?[address] ?[address] ?[place] [address] ?[phone] (office) ?[phone] (fax) ? ?

## 2021-07-06 ENCOUNTER — Telehealth: Payer: Self-pay | Admitting: Rheumatology

## 2021-07-06 NOTE — Telephone Encounter (Signed)
I called patient, samples labeled in cabinet.

## 2021-07-06 NOTE — Telephone Encounter (Signed)
Patient called the office stating she normally picks up samples of Otezla from our office. Patient states she runs out of medication today and would like to pick up another sample.

## 2021-07-07 NOTE — Telephone Encounter (Signed)
Medication Samples have been provided to the patient.  Drug name: [place]       Strength: 30 mg        Qty: 2 LOT: [id number]   Exp.Date: 10/02/2022  Dosing instructions: Take one tablet by mouth twice daily.

## 2021-08-05 ENCOUNTER — Telehealth: Payer: Self-pay | Admitting: Rheumatology

## 2021-08-05 NOTE — Telephone Encounter (Signed)
Samples reserved in the cabinet for patient.

## 2021-08-05 NOTE — Telephone Encounter (Signed)
FYI:  Patient states she will stop by the office and pick up samples of Otezla.

## 2021-08-05 NOTE — Progress Notes (Signed)
Office Visit Note  Patient: Frances Watkins             Date of Birth: Oct 14, 1965           MRN: [id number]             PCP: Karilyn Cota, NP Referring: Karilyn Cota, NP Visit Date: 08/11/2021 Occupation: @GUAROCC @  Subjective:  Pain in both feet  History of Present Illness: Frances Watkins is a 56 y.o. female with history of psoriatic arthritis, and psoriasis.  She states she has been taking Otezla samples on a regular basis.  She is also on methotrexate 0.8 mL subcu weekly.  She has been taking folic acid 2 mg p.o. daily.  She states she has been having discomfort in her bilateral feet which she is describing over the lateral aspect of her feet.  She also gives intermittent discomfort in the Achilles tendon and the plantar fascia.  She had few patches of psoriasis on the face.  Activities of Daily Living:  Patient reports morning stiffness for several hours.   Patient Reports nocturnal pain.  Difficulty dressing/grooming: Reports Difficulty climbing stairs: Reports Difficulty getting out of chair: Reports Difficulty using hands for taps, buttons, cutlery, and/or writing: Reports  Review of Systems  Constitutional:  Positive for fatigue.  HENT:  Negative for mouth sores and mouth dryness.   Eyes:  Negative for dryness.  Respiratory:  Negative for shortness of breath.   Cardiovascular:  Negative for chest pain and palpitations.  Gastrointestinal:  Negative for blood in stool, constipation and diarrhea.  Endocrine: Negative for increased urination.  Genitourinary:  Negative for involuntary urination.  Musculoskeletal:  Positive for joint pain, joint pain, joint swelling, myalgias, morning stiffness and myalgias. Negative for muscle tenderness.  Skin:  Positive for rash. Negative for color change, hair loss and sensitivity to sunlight.  Allergic/Immunologic: Negative for susceptible to infections.  Neurological:  Negative for dizziness and headaches.  Hematological:   Negative for swollen glands.  Psychiatric/Behavioral:  Negative for depressed mood and sleep disturbance. The patient is not nervous/anxious.     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   Hypertension 05/29/2020   History of stomach ulcers    PONV (postoperative nausea and vomiting)    Tachycardia    Palpitations 04/09/2020   Primary hypertension 04/09/2020   GERD (gastroesophageal reflux disease)    Heart murmur    Psoriasis 09/21/2018   Anxiety 09/21/2018   Essential hypertension 09/21/2018   History of kidney stones 09/21/2018   Eustachian tube dysfunction 10/05/2011   History of cholesteatoma 10/05/2011    Past Medical History:  Diagnosis Date   Anxiety 09/21/2018   Essential hypertension 09/21/2018   Eustachian tube dysfunction 10/05/2011   GERD (gastroesophageal reflux disease)    Heart murmur    no treatment   History of cholesteatoma 10/05/2011   History of kidney stones    History of stomach ulcers    PAF (paroxysmal atrial fibrillation) (HCC) 04/09/2020   Palpitations 04/09/2020   PONV (postoperative nausea and vomiting)    no problem  03/14/12   Primary hypertension 04/09/2020   Psoriasis    Tachycardia     Family History  Problem Relation Age of Onset   Arthritis  Mother    Diabetes Father    Hypertension Father    High Cholesterol Father    Heart disease Father    Heart attack Father    Gout Father    Cirrhosis Father    Healthy Sister    Psoriasis Daughter        psoriatic arthritis   Healthy Daughter    Lymphoma Maternal Grandmother    Cancer Paternal Grandfather    Past Surgical History:  Procedure Laterality Date   COLONOSCOPY     2014, 2019   HOLMIUM LASER APPLICATION Right 03/23/2012    Procedure: HOLMIUM LASER APPLICATION;  Surgeon: Anner Crete, MD;  Location: [facility];  Service: Urology;  Laterality: Right;   INNER EAR SURGERY  2000   LITHOTRIPSY     x2   TONSILLECTOMY     age 30 yrs.   TYMPANOPLASTY W/ MASTOIDECTOMY     x2   UPPER GI ENDOSCOPY     2014, 2019   URETEROSCOPY  2014   Social History   Social History Narrative   Not on file   Immunization History  Administered Date(s) Administered   PFIZER(Purple Top)SARS-COV-2 Vaccination 04/05/2019, 04/24/2019, 10/05/2019   Pneumococcal Conjugate-13 09/27/2018     Objective: Vital Signs: BP (!) 169/85 (BP Location: Left Arm, Patient Position: Sitting, Cuff Size: Normal)   Pulse (!) 57   Ht 5\' 3"  (1.6 m)   Wt 192 lb 12.8 oz (87.5 kg)   BMI 34.15 kg/m    Physical Exam Vitals and nursing note reviewed.  Constitutional:      Appearance: She is well-developed.  HENT:     Head: Normocephalic and atraumatic.  Eyes:     Conjunctiva/sclera: Conjunctivae normal.  Cardiovascular:     Rate and Rhythm: Normal rate and regular rhythm.     Heart sounds: Normal heart sounds.  Pulmonary:     Effort: Pulmonary effort is normal.     Breath sounds: Normal breath sounds.  Abdominal:     General: Bowel sounds are normal.     Palpations: Abdomen is soft.  Musculoskeletal:     Cervical back: Normal range of motion.  Lymphadenopathy:     Cervical: No cervical adenopathy.  Skin:    General: Skin is warm and dry.     Capillary Refill: Capillary refill takes less than 2 seconds.     Comments: No psoriasis lesions were noted.  Neurological:     Mental Status: She is alert and oriented to person, place, and time.  Psychiatric:        Behavior: Behavior normal.      Musculoskeletal Exam: C-spine thoracic and lumbar spine were in good range of motion.  She had no SI joint tenderness.  She had good mobility in her lumbar and thoracic spine.  Shoulder joints, elbow joints, wrist joints with good range  of motion.  She had bilateral PIP and DIP thickening.  Hip joints and knee joints in good range of motion.  There was no tenderness over Achilles tendon or plantar fascia.  She had bilateral pes cavus and hammertoes.  CDAI Exam: CDAI Score: -- Patient Global: --; Provider Global: -- Swollen: --; Tender: -- Joint Exam 08/11/2021   No joint exam has been documented for this visit   There is currently no information documented on the homunculus. Go to the Rheumatology activity and complete the homunculus joint exam.  Investigation: No additional findings.  Imaging: No results found.  Recent Labs: Lab Results  Component Value Date   WBC 5.6 05/04/2019   HGB 13.1 05/04/2019   PLT 289 05/04/2019   NA 141 06/19/2019   K 4.1 06/19/2019   CL 106 06/19/2019   CO2 30 06/19/2019   GLUCOSE 93 06/19/2019   BUN 13 06/19/2019   CREATININE 0.72 06/19/2019   BILITOT 0.5 06/19/2019   ALKPHOS 73 01/30/2019   AST 17 06/19/2019   ALT 14 06/19/2019   PROT 6.6 06/19/2019   ALBUMIN 4.5 01/30/2019   CALCIUM 8.9 06/19/2019   GFRAA 112 06/19/2019    Speciality Comments: No specialty comments available.  Procedures:  No procedures performed Allergies: Sulfa drugs cross reactors   Assessment / Plan:     Visit Diagnoses: Psoriatic arthritis (HCC)-she complains of ongoing pain and discomfort in her joints especially her feet.  No synovitis was noted.  She gives history of intermittent issues with Planter fasciitis and Achilles tendinitis.  She had no tendinitis on examination today.  She continues to take Otezla 30 mg p.o. twice daily and methotrexate 0.8 mg subcu weekly with folic acid.  Psoriasis-she states she had few patches of psoriasis on her face.  No active lesions were noted.  High risk medication use - Otezla 30 mg 1 tablet by mouth twice daily, Methotrexate 0.8 ml sq injections once weekly and folic acid 2 mg po daily.  We do not have any labs in the system since December 2022.   Patient states she had labs in March 2023.  She will have the labs forwarded to [place].  I advised her to get labs this month which should include CBC with differential and CMP with GFR and then every 3 months to monitor for drug toxicity.  Information on immunization was placed in the AVS.  She was also advised to hold methotrexate if she develops an infection and resume after the infection resolves.  We have no other treatment options due to her insurance issues.  She states that she will try to find a new job.  She has been getting Otezla samples from our office.  Pain in thoracic spine-she has intermittent discomfort in the thoracic region.  There was no point tenderness.  Pes cavus-she complains of pain and discomfort in her bilateral feet.  She had no synovitis on examination.  There was no evidence of Planter fasciitis or Achilles tendinitis.  She would benefit from orthotics.  She has hammertoes in her left foot.  Plantar fasciitis, bilateral -she had no tenderness on the palpation today.  Achilles tendinitis, left leg-there was no tenderness on palpation.  Chronic SI joint pain-she has intermittent SI joint discomfort.  There was no tenderness on palpation.  Other medical problems are listed as follows:  Essential hypertension  Solar elastosis  History of kidney stones  Anxiety  PAF (paroxysmal atrial fibrillation) (HCC)  PVC's (premature ventricular contractions) - Followed by Dr. Servando Salina. She had an echocardiogram performed on 04/16/2020 which revealed mild left ventricular thickening.    Orders: No orders of the defined types were placed in this encounter.  No orders of the defined types were placed in this encounter.    Follow-Up Instructions: Return in about 5 months (around 01/11/2022) for Psoriatic arthritis.   Pollyann Savoy, MD  Note - This record has been created using Animal nutritionist.  Chart creation errors have been sought, but may not always  have been located.  Such creation errors do not reflect on  the standard of medical care.

## 2021-08-11 ENCOUNTER — Ambulatory Visit (INDEPENDENT_AMBULATORY_CARE_PROVIDER_SITE_OTHER): Payer: Self-pay | Admitting: Rheumatology

## 2021-08-11 ENCOUNTER — Encounter: Payer: Self-pay | Admitting: Rheumatology

## 2021-08-11 VITALS — BP 169/85 | HR 57 | Ht 63.0 in | Wt 192.8 lb

## 2021-08-11 DIAGNOSIS — Z87442 Personal history of urinary calculi: Secondary | ICD-10-CM

## 2021-08-11 DIAGNOSIS — L578 Other skin changes due to chronic exposure to nonionizing radiation: Secondary | ICD-10-CM

## 2021-08-11 DIAGNOSIS — M546 Pain in thoracic spine: Secondary | ICD-10-CM

## 2021-08-11 DIAGNOSIS — M722 Plantar fascial fibromatosis: Secondary | ICD-10-CM

## 2021-08-11 DIAGNOSIS — L4050 Arthropathic psoriasis, unspecified: Secondary | ICD-10-CM

## 2021-08-11 DIAGNOSIS — I480 Paroxysmal atrial fibrillation: Secondary | ICD-10-CM

## 2021-08-11 DIAGNOSIS — M533 Sacrococcygeal disorders, not elsewhere classified: Secondary | ICD-10-CM

## 2021-08-11 DIAGNOSIS — G8929 Other chronic pain: Secondary | ICD-10-CM

## 2021-08-11 DIAGNOSIS — Z79899 Other long term (current) drug therapy: Secondary | ICD-10-CM

## 2021-08-11 DIAGNOSIS — M7662 Achilles tendinitis, left leg: Secondary | ICD-10-CM

## 2021-08-11 DIAGNOSIS — L409 Psoriasis, unspecified: Secondary | ICD-10-CM

## 2021-08-11 DIAGNOSIS — I493 Ventricular premature depolarization: Secondary | ICD-10-CM

## 2021-08-11 DIAGNOSIS — F419 Anxiety disorder, unspecified: Secondary | ICD-10-CM

## 2021-08-11 DIAGNOSIS — Q667 Congenital pes cavus, unspecified foot: Secondary | ICD-10-CM

## 2021-08-11 DIAGNOSIS — I10 Essential (primary) hypertension: Secondary | ICD-10-CM

## 2021-08-11 NOTE — Patient Instructions (Signed)
Standing Labs We placed an order today for your standing lab work.   Please have your standing labs drawn in CBC with diff and CMP with GFR  now and every 3 months  If possible, please have your labs drawn 2 weeks prior to your appointment so that the provider can discuss your results at your appointment.  Please note that you may see your imaging and lab results in MyChart before we have reviewed them. We may be awaiting multiple results to interpret others before contacting you. Please allow our office up to 72 hours to thoroughly review all of the results before contacting the office for clarification of your results.  We have open lab daily: Monday through Thursday from 1:30-4:30 PM and Friday from 1:30-4:00 PM at the office of Dr. Pollyann Savoy, [facility].   Please be advised, all patients with office appointments requiring lab work will take precedent over walk-in lab work.  If possible, please come for your lab work on Monday and Friday afternoons, as you may experience shorter wait times. The office is located at [address], [place], [place] [address] No appointment is necessary.   Labs are drawn by [facility]. Please bring your co-pay at the time of your lab draw.  You may receive a bill from [facility] for your lab work.  Please note if you are on Hydroxychloroquine and and an order has been placed for a Hydroxychloroquine level, you will need to have it drawn 4 hours or more after your last dose.  If you wish to have your labs drawn at another location, please call the office 24 hours in advance to send orders.  If you have any questions regarding directions or hours of operation,  please call [phone].   As a reminder, please drink plenty of water prior to coming for your lab work. Thanks!   Vaccines You are taking a medication(s) that can suppress your immune system.  The following immunizations are recommended: Flu annually Covid-19  Td/Tdap  (tetanus, diphtheria, pertussis) every 10 years Pneumonia (Prevnar 15 then Pneumovax 23 at least 1 year apart.  Alternatively, can take Prevnar 20 without needing additional dose) Shingrix: 2 doses from 4 weeks to 6 months apart  Please check with your PCP to make sure you are up to date.   If you have signs or symptoms of an infection or start antibiotics: First, call your PCP for workup of your infection. Hold your medication through the infection, until you complete your antibiotics, and until symptoms resolve if you take the following: Injectable medication (Actemra, Benlysta, Cimzia, Cosentyx, Enbrel, Humira, Kevzara, Orencia, Remicade, Simponi, Stelara, Taltz, Tremfya) Methotrexate Leflunomide (Arava) Mycophenolate (Cellcept) Harriette Ohara, Olumiant, or Rinvoq

## 2021-08-28 ENCOUNTER — Telehealth: Payer: Self-pay | Admitting: *Deleted

## 2021-08-28 NOTE — Telephone Encounter (Signed)
Labs received from:[facility]  Drawn on:08/26/2021  Reviewed by:Sherron Ales, PA-C  Labs drawn:CBC, CMP  Results:Glucose 107   Patient is on MTX 0.8 mL SQ weekly and Otezla 30 mg po BID.

## 2021-09-02 ENCOUNTER — Other Ambulatory Visit: Payer: Self-pay | Admitting: Physician Assistant

## 2021-09-02 DIAGNOSIS — L4050 Arthropathic psoriasis, unspecified: Secondary | ICD-10-CM

## 2021-09-02 NOTE — Telephone Encounter (Signed)
Next Visit: 01/14/2021  Last Visit: 08/11/2021  Last Fill: 05/06/2021  DX: Psoriatic arthritis   Current Dose per office note 08/11/2021: Methotrexate 0.8 ml sq injections once weekly   Labs: 08/28/2021 Glucose 107  Okay to refill MTX?

## 2021-09-03 ENCOUNTER — Telehealth: Payer: Self-pay | Admitting: *Deleted

## 2021-09-03 NOTE — Telephone Encounter (Signed)
Patient left message requesting samples of Otezla.  Samples reserved in cabinet for patient. Patient advised and states she will come to the office to pick them up in the morning.

## 2021-09-04 NOTE — Telephone Encounter (Signed)
Medication Samples have been provided to the patient.  Drug name: Otezla       Strength: 30 mg        Qty: 2  LOT: [id number]  Exp.Date: 12/02/2022  Dosing instructions: Take one tablet by mouth twice daily.   

## 2021-09-16 ENCOUNTER — Other Ambulatory Visit: Payer: Self-pay

## 2021-09-16 MED ORDER — FLECAINIDE ACETATE 50 MG PO TABS: 50.0000 mg | ORAL_TABLET | Freq: Two times a day (BID) | ORAL | 2 refills | Status: DC

## 2021-09-29 ENCOUNTER — Telehealth: Payer: Self-pay | Admitting: Rheumatology

## 2021-09-29 NOTE — Telephone Encounter (Signed)
Patient called the office requesting samples of Otezla. Patient states she has about a weeks worth left.

## 2021-09-29 NOTE — Telephone Encounter (Signed)
Spoke with patient and advised her I would have two boxes of samples labeled and ready for pick up. Patient verbalized understanding.   Last labs according to telephone encounter on 08/28/2021:  08/26/2021 CBC and CMP.

## 2021-09-30 NOTE — Telephone Encounter (Signed)
Medication Samples have been provided to the patient.  Drug name: Otezla       Strength: 30 mg        Qty: 2  LOT: [id number]  Exp.Date: 12/02/2022  Dosing instructions: Take one tablet by mouth twice daily.   

## 2021-10-21 ENCOUNTER — Telehealth: Payer: Self-pay | Admitting: *Deleted

## 2021-10-21 DIAGNOSIS — Z79899 Other long term (current) drug therapy: Secondary | ICD-10-CM

## 2021-10-21 NOTE — Telephone Encounter (Signed)
Labs received from:[facility]   Drawn on:10/16/2021  Reviewed by:Hazel Sams, PA-C  Labs drawn:Lipid Panel, CBC, CMP  Results:Non- HDL 132      Glucose 105     AST 42  Patient is on MTX 0.8 mL SQ weekly and Otezla 30 mg po BID.  Per Lovena Le, Please clarify if patient has been taking any Tylenol or alcohol use. Recommend avoiding the use of Tylenol and alcohol. Reduce MTX to 0.7 mL SQ weekly. Recheck hepatic function panel in 1 month.

## 2021-10-21 NOTE — Telephone Encounter (Signed)
Patient states she has been taking any Tylenol and using alcohol a few times per month. Patient states she has had some headaches requiring Tylenol and has had beer at football games.  Patient advised it is recommended to avoid the use of Tylenol and alcohol. Patient advised to reduce MTX to 0.7 mL SQ weekly. Recheck hepatic function panel in 1 month. Future order placed. Patient expressed understanding.

## 2021-11-09 ENCOUNTER — Telehealth: Payer: Self-pay | Admitting: Rheumatology

## 2021-11-09 NOTE — Telephone Encounter (Signed)
Patient advised she may come by the office to pick up samples. Samples of Otezla reserved in the cabinet for patient.

## 2021-11-09 NOTE — Telephone Encounter (Signed)
Patient called the office requesting a sample of Otezla.

## 2021-11-23 ENCOUNTER — Telehealth: Payer: Self-pay | Admitting: *Deleted

## 2021-11-23 NOTE — Telephone Encounter (Signed)
Labs received from:[facility]  Drawn on:11/18/2021  Reviewed by:Hazel Sams, PA-C  Labs drawn:CBC, CMP  Results:WNL  Patient is on MTX 0.8 mL SQ weekly and Otezla 30 mg po BID.

## 2021-12-16 ENCOUNTER — Other Ambulatory Visit: Payer: Self-pay | Admitting: Rheumatology

## 2021-12-16 DIAGNOSIS — L4050 Arthropathic psoriasis, unspecified: Secondary | ICD-10-CM

## 2021-12-16 NOTE — Telephone Encounter (Signed)
Next Visit: 01/14/2022  Last Visit: 08/11/2021  Last Fill: 09/02/2021  DX: Psoriatic arthritis   Current Dose per office note 08/11/2021: Methotrexate 0.8 ml sq injections once weekly   Labs: 11/18/2021 WNL  Okay to refill MTX?

## 2021-12-21 ENCOUNTER — Ambulatory Visit: Payer: Self-pay | Attending: Cardiology | Admitting: Cardiology

## 2021-12-21 ENCOUNTER — Encounter: Payer: Self-pay | Admitting: Cardiology

## 2021-12-21 ENCOUNTER — Telehealth: Payer: Self-pay | Admitting: Pharmacist

## 2021-12-21 VITALS — BP 170/100 | HR 51 | Ht 63.0 in | Wt 188.0 lb

## 2021-12-21 DIAGNOSIS — I480 Paroxysmal atrial fibrillation: Secondary | ICD-10-CM

## 2021-12-21 DIAGNOSIS — I10 Essential (primary) hypertension: Secondary | ICD-10-CM

## 2021-12-21 NOTE — Progress Notes (Signed)
Electrophysiology Office Note   Date:  12/21/2021   ID:  Naama, [place] 1965/06/04, MRN [id number]  PCP:  Karilyn Cota, NP  Cardiologist:  Servando Salina Primary Electrophysiologist:  Alliyah Roesler Jorja Loa, MD    Chief Complaint: Atrial fibrillation   History of Present Illness: Frances Watkins is a 56 y.o. female who is being seen today for the evaluation of atrial fibrillation at the request of Wilburn, Doy Mince, NP. Presenting today for electrophysiology evaluation.  She has a history significant for atrial fibrillation, hypertension, PVCs, psoriatic arthritis, osteoarthritis.  She was seen March 2022 by her primary cardiologist with concern of atrial fibrillation confirmed by cardiac monitor.  She was having increased frequency and timing of her episodes.  She was started on flecainide.  Since starting the flecainide she has done well.  She is noted no further episodes of atrial fibrillation.  Today, denies symptoms of palpitations, chest pain, shortness of breath, orthopnea, PND, lower extremity edema, claudication, dizziness, presyncope, syncope, bleeding, or neurologic sequela. The patient is tolerating medications without difficulties.     Past Medical History:  Diagnosis Date   Anxiety 09/21/2018   Essential hypertension 09/21/2018   Eustachian tube dysfunction 10/05/2011   GERD (gastroesophageal reflux disease)    Heart murmur    no treatment   History of cholesteatoma 10/05/2011   History of kidney stones    History of stomach ulcers    PAF (paroxysmal atrial fibrillation) (HCC) 04/09/2020   Palpitations 04/09/2020   PONV (postoperative nausea and vomiting)    no problem  03/14/12   Primary hypertension 04/09/2020   Psoriasis    Tachycardia    Past Surgical History:  Procedure Laterality Date   COLONOSCOPY     2014, 2019   HOLMIUM LASER APPLICATION Right 03/23/2012   Procedure: HOLMIUM LASER APPLICATION;  Surgeon: Anner Crete, MD;  Location: [facility];   Service: Urology;  Laterality: Right;   INNER EAR SURGERY  2000   LITHOTRIPSY     x2   TONSILLECTOMY     age 65 yrs.   TYMPANOPLASTY W/ MASTOIDECTOMY     x2   UPPER GI ENDOSCOPY     2014, 2019   URETEROSCOPY  2014     Current Outpatient Medications  Medication Sig Dispense Refill   Apremilast 30 MG TABS Take 1 tablet (30 mg total) by mouth in the morning and at bedtime. After completion of starter pack. 28 tablet 0   escitalopram (LEXAPRO) 10 MG tablet Take 10 mg by mouth daily.     flecainide (TAMBOCOR) 50 MG tablet Take 1 tablet (50 mg total) by mouth 2 (two) times daily. 180 tablet 2   folic acid (FOLVITE) 1 MG tablet TAKE 2 TABLETS BY MOUTH DAILY 180 tablet 3   hydrochlorothiazide (MICROZIDE) 12.5 MG capsule TAKE 1 CAPSULE BY MOUTH DAILY. 90 capsule 1   methotrexate 50 MG/2ML injection INJECT 0.8 MLS (20 MG TOTAL) INTO THE SKIN ONCE A WEEK. 10 mL 0   metoprolol succinate (TOPROL XL) 25 MG 24 hr tablet Take 1.5 tablets (37.5 mg total) by mouth daily. 135 tablet 3   Multiple Vitamin (MULTIVITAMIN WITH MINERALS) TABS tablet Take 1 tablet by mouth daily. Unknown strength     olmesartan (BENICAR) 40 MG tablet Take 40 mg by mouth daily.     TUBERCULIN SYR 1CC/27GX1/2" (B-D TB SYRINGE 1CC/27GX1/2") 27G X 1/2" 1 ML MISC 12 Syringes by Does not apply route once a week. (Patient taking differently:  12 Syringes by Does not apply route once a week. To be used for injectable meds) 12 each 3   No current facility-administered medications for this visit.    Allergies:   Sulfa drugs cross reactors   Social History:  The patient  reports that she has never smoked. She has never been exposed to tobacco smoke. She has never used smokeless tobacco. She reports current alcohol use. She reports that she does not use drugs.   Family History:  The patient's family history includes Arthritis in her mother; Cancer in her paternal grandfather; Cirrhosis in her father; Diabetes in her father; Gout in her  father; Healthy in her daughter and sister; Heart attack in her father; Heart disease in her father; High Cholesterol in her father; Hypertension in her father; Lymphoma in her maternal grandmother; Psoriasis in her daughter.   ROS:  Please see the history of present illness.   Otherwise, review of systems is positive for none.   All other systems are reviewed and negative.   PHYSICAL EXAM: VS:  BP (!) 170/100   Pulse (!) 51   Ht 5\' 3"  (1.6 m)   Wt 188 lb (85.3 kg)   SpO2 96%   BMI 33.30 kg/m  , BMI Body mass index is 33.3 kg/m. GEN: Well nourished, well developed, in no acute distress  HEENT: normal  Neck: no JVD, carotid bruits, or masses Cardiac: RRR; no murmurs, rubs, or gallops,no edema  Respiratory:  clear to auscultation bilaterally, normal work of breathing GI: soft, nontender, nondistended, + BS MS: no deformity or atrophy  Skin: warm and dry Neuro:  Strength and sensation are intact Psych: euthymic mood, full affect  EKG:  EKG is ordered today. Personal review of the ekg ordered shows sinus rhythm   Recent Labs: No results found for requested labs within last 365 days.    Lipid Panel  No results found for: "CHOL", "TRIG", "HDL", "CHOLHDL", "VLDL", "LDLCALC", "LDLDIRECT"   Wt Readings from Last 3 Encounters:  12/21/21 188 lb (85.3 kg)  08/11/21 192 lb 12.8 oz (87.5 kg)  06/08/21 191 lb 12.8 oz (87 kg)      Other studies Reviewed: Additional studies/ records that were reviewed today include: TTE 04/16/20  Review of the above records today demonstrates:   1. Frequent PVC's. Left ventricular ejection fraction, by estimation, is  60 to 65%. The left ventricle has normal function. The left ventricle has  no regional wall motion abnormalities. There is mild concentric left  ventricular hypertrophy. Left  ventricular diastolic parameters were normal.   2. Right ventricular systolic function is normal. The right ventricular  size is normal. There is normal pulmonary  artery systolic pressure.   3. The mitral valve is normal in structure. No evidence of mitral valve  regurgitation. No evidence of mitral stenosis.   4. The aortic valve is tricuspid. Aortic valve regurgitation is not  visualized. No aortic stenosis is present.   5. The inferior vena cava is normal in size with greater than 50%  respiratory variability, suggesting right atrial pressure of 3 mmHg.   Cardiac monitor 05/02/2020 personally reviewed            1.  Paroxysmal symptomatic supraventricular tachycardia (one episode that appear to be AVNRT with multiple others appears to be atrial tachycardia).            2.  Rare symptomatic premature ventricular complex.  ASSESSMENT AND PLAN:  1.  Paroxysmal atrial fibrillation: Currently on Toprol-XL 37.5  mg daily, flecainide milligrams twice daily.  High risk medication monitoring for flecainide.  QRS remains narrow.  CHA2DS2-VASc of 2.  She is remained in sinus rhythm without any further episodes of atrial fibrillation.  We Nour Rodrigues continue with current management.  She would be an ablation candidate, though she is trying to get insurance.  She is overall happy with her control on flecainide.  2.  Hypertension: Elevated today.  She has not yet taken her daily medications.  She Kloe Oates take these when she gets home.   Current medicines are reviewed at length with the patient today.   The patient does not have concerns regarding her medicines.  The following changes were made today: none  Labs/ tests ordered today include:  Orders Placed This Encounter  Procedures   EKG 12-Lead      Disposition:   FU with Tamer Baughman 6 months  Signed, Alexi Geibel Jorja Loa, MD  12/21/2021 10:38 AM     [facility] [address] [place] [address] [phone] (office) [phone] (fax)

## 2021-12-21 NOTE — Telephone Encounter (Signed)
Patient picked up [facility] 30mg  tablets x 2 packs (56 tablets)  Lot:  Exp: 05/02/2023  Lot" 05/04/2023 Exp: 12/02/2022  Directions: Take one tablet by mouth twice daily  12/04/2022, PharmD, MPH, BCPS, CPP Clinical Pharmacist (Rheumatology and Pulmonology)

## 2021-12-21 NOTE — Patient Instructions (Signed)
Medication Instructions:  Your physician recommends that you continue on your current medications as directed. Please refer to the Current Medication list given to you today.  *If you need a refill on your cardiac medications before your next appointment, please call your pharmacy*   Lab Work: None ordered   Testing/Procedures: None ordered   Follow-Up: At [facility], you and your health needs are our priority.  As part of our continuing mission to provide you with exceptional heart care, we have created designated Provider Care Teams.  These Care Teams include your primary Cardiologist (physician) and Advanced Practice Providers (APPs -  Physician Assistants and Nurse Practitioners) who all work together to provide you with the care you need, when you need it.  Your next appointment:   6 month(s)  The format for your next appointment:   In Person  Provider:   Will Camnitz, MD    Thank you for choosing [facility]!!   Trelyn Vanderlinde, RN [phone]  Other Instructions   Important Information About Sugar           

## 2022-01-01 NOTE — Progress Notes (Deleted)
Office Visit Note  Patient: Frances Watkins             Date of Birth: 10-17-1965           MRN: [id number]             PCP: Karilyn Cota, NP Referring: Karilyn Cota, NP Visit Date: 01/14/2022 Occupation: @GUAROCC @  Subjective:  No chief complaint on file.   History of Present Illness: Frances Watkins is a 56 y.o. female ***   Activities of Daily Living:  Patient reports morning stiffness for *** {minute/hour:19697}.   Patient {ACTIONS;DENIES/REPORTS:21021675::"Denies"} nocturnal pain.  Difficulty dressing/grooming: {ACTIONS;DENIES/REPORTS:21021675::"Denies"} Difficulty climbing stairs: {ACTIONS;DENIES/REPORTS:21021675::"Denies"} Difficulty getting out of chair: {ACTIONS;DENIES/REPORTS:21021675::"Denies"} Difficulty using hands for taps, buttons, cutlery, and/or writing: {ACTIONS;DENIES/REPORTS:21021675::"Denies"}  No Rheumatology ROS completed.   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   Hypertension 05/29/2020   History of stomach ulcers    PONV (postoperative nausea and vomiting)    Tachycardia    Palpitations 04/09/2020   Primary hypertension 04/09/2020   GERD (gastroesophageal reflux disease)    Heart murmur    Psoriasis 09/21/2018   Anxiety 09/21/2018   Essential hypertension 09/21/2018   History of kidney stones 09/21/2018   Eustachian tube dysfunction 10/05/2011   History of cholesteatoma 10/05/2011    Past Medical History:  Diagnosis Date   Anxiety 09/21/2018   Essential hypertension 09/21/2018   Eustachian tube dysfunction 10/05/2011   GERD (gastroesophageal reflux disease)    Heart murmur    no treatment   History of cholesteatoma 10/05/2011   History of kidney stones    History of stomach  ulcers    PAF (paroxysmal atrial fibrillation) (HCC) 04/09/2020   Palpitations 04/09/2020   PONV (postoperative nausea and vomiting)    no problem  03/14/12   Primary hypertension 04/09/2020   Psoriasis    Tachycardia     Family History  Problem Relation Age of Onset   Arthritis Mother    Diabetes Father    Hypertension Father    High Cholesterol Father    Heart disease Father    Heart attack Father    Gout Father    Cirrhosis Father    Healthy Sister    Psoriasis Daughter        psoriatic arthritis   Healthy Daughter    Lymphoma Maternal Grandmother    Cancer Paternal Grandfather    Past Surgical History:  Procedure Laterality Date   COLONOSCOPY     2014, 2019   HOLMIUM LASER APPLICATION Right 03/23/2012   Procedure: HOLMIUM LASER APPLICATION;  Surgeon: 03/25/2012, MD;  Location: [facility];  Service: Urology;  Laterality: Right;   INNER EAR SURGERY  2000   LITHOTRIPSY     x2   TONSILLECTOMY     age 64 yrs.   TYMPANOPLASTY W/ MASTOIDECTOMY     x2   UPPER GI ENDOSCOPY     2014, 2019   URETEROSCOPY  2014   Social History   Social History Narrative   Not on file   Immunization History  Administered Date(s) Administered   PFIZER(Purple Top)SARS-COV-2 Vaccination 04/05/2019, 04/24/2019, 10/05/2019   Pneumococcal Conjugate-13 09/27/2018     Objective: Vital Signs: There were no vitals taken for this  visit.   Physical Exam   Musculoskeletal Exam: ***  CDAI Exam: CDAI Score: -- Patient Global: --; Provider Global: -- Swollen: --; Tender: -- Joint Exam 01/14/2022   No joint exam has been documented for this visit   There is currently no information documented on the homunculus. Go to the Rheumatology activity and complete the homunculus joint exam.  Investigation: No additional findings.  Imaging: No results found.  Recent Labs: Lab Results  Component Value Date   WBC 5.6 05/04/2019   HGB 13.1 05/04/2019   PLT 289 05/04/2019   NA 141  06/19/2019   K 4.1 06/19/2019   CL 106 06/19/2019   CO2 30 06/19/2019   GLUCOSE 93 06/19/2019   BUN 13 06/19/2019   CREATININE 0.72 06/19/2019   BILITOT 0.5 06/19/2019   ALKPHOS 73 01/30/2019   AST 17 06/19/2019   ALT 14 06/19/2019   PROT 6.6 06/19/2019   ALBUMIN 4.5 01/30/2019   CALCIUM 8.9 06/19/2019   GFRAA 112 06/19/2019    Speciality Comments: No specialty comments available.  Procedures:  No procedures performed Allergies: Sulfa drugs cross reactors   Assessment / Plan:     Visit Diagnoses: Psoriatic arthritis (HCC)  Psoriasis  High risk medication use  Pes cavus  Plantar fasciitis, bilateral  Achilles tendinitis, left leg  Chronic SI joint pain  Essential hypertension  Solar elastosis  History of kidney stones  Anxiety  PAF (paroxysmal atrial fibrillation) (HCC)  PVC's (premature ventricular contractions)  Orders: No orders of the defined types were placed in this encounter.  No orders of the defined types were placed in this encounter.   Face-to-face time spent with patient was *** minutes. Greater than 50% of time was spent in counseling and coordination of care.  Follow-Up Instructions: No follow-ups on file.   Gearldine Bienenstock, PA-C  Note - This record has been created using Dragon software.  Chart creation errors have been sought, but may not always  have been located. Such creation errors do not reflect on  the standard of medical care.

## 2022-01-14 ENCOUNTER — Ambulatory Visit: Payer: Self-pay | Admitting: Physician Assistant

## 2022-01-14 DIAGNOSIS — I493 Ventricular premature depolarization: Secondary | ICD-10-CM

## 2022-01-14 DIAGNOSIS — I10 Essential (primary) hypertension: Secondary | ICD-10-CM

## 2022-01-14 DIAGNOSIS — F419 Anxiety disorder, unspecified: Secondary | ICD-10-CM

## 2022-01-14 DIAGNOSIS — L409 Psoriasis, unspecified: Secondary | ICD-10-CM

## 2022-01-14 DIAGNOSIS — L4050 Arthropathic psoriasis, unspecified: Secondary | ICD-10-CM

## 2022-01-14 DIAGNOSIS — M722 Plantar fascial fibromatosis: Secondary | ICD-10-CM

## 2022-01-14 DIAGNOSIS — Z79899 Other long term (current) drug therapy: Secondary | ICD-10-CM

## 2022-01-14 DIAGNOSIS — G8929 Other chronic pain: Secondary | ICD-10-CM

## 2022-01-14 DIAGNOSIS — Z87442 Personal history of urinary calculi: Secondary | ICD-10-CM

## 2022-01-14 DIAGNOSIS — M7662 Achilles tendinitis, left leg: Secondary | ICD-10-CM

## 2022-01-14 DIAGNOSIS — Q667 Congenital pes cavus, unspecified foot: Secondary | ICD-10-CM

## 2022-01-14 DIAGNOSIS — L578 Other skin changes due to chronic exposure to nonionizing radiation: Secondary | ICD-10-CM

## 2022-01-14 DIAGNOSIS — I480 Paroxysmal atrial fibrillation: Secondary | ICD-10-CM

## 2022-01-18 ENCOUNTER — Other Ambulatory Visit: Payer: Self-pay | Admitting: Cardiology

## 2022-01-18 NOTE — Telephone Encounter (Signed)
Rx refill sent to pharmacy. 

## 2022-01-20 ENCOUNTER — Telehealth: Payer: Self-pay | Admitting: *Deleted

## 2022-01-20 NOTE — Telephone Encounter (Signed)
Patient contacted the office requesting a sample of Otezla. Patient advised she may come by the office to pick up sample. Patient states she will come by in the morning to pick it up. Sample reserved in the cabinet for patient.

## 2022-01-21 NOTE — Telephone Encounter (Signed)
Medication Samples have been provided to the patient.  Drug name: Henderson [place]       Strength: 30mg         Qty: 2 boxes  LOT:  Exp.Date: 10/02/2023  Dosing instructions: Take 1 tablet by mouth twice daily.

## 2022-02-04 NOTE — Progress Notes (Unsigned)
Office Visit Note  Patient: Frances Watkins             Date of Birth: 10/21/1965           MRN: [id number]             PCP: Esperanza Richters, NP Referring: Esperanza Richters, NP Visit Date: 02/16/2022 Occupation: @GUAROCC @  Subjective:  Achilles tendonitis of both feet   History of Present Illness: Frances Watkins is a 57 y.o. female with history of psoriatic arthritis.  Patient is currently taking Otezla 30 mg 1 tablet by mouth twice daily, Methotrexate 0.7 ml sq injections once weekly and folic acid 2 mg po daily.  She is tolerating combination therapy without any side effects.  She has not missed any doses recently.  Patient reports that she continues to have discomfort in both feet due to Achilles tendinitis.  She states her pain is most severe when she is barefoot.  She denies any discomfort in her ankle joints or toes currently.  She experiences intermittent stiffness in both hands but denies any joint swelling at this time.  She denies any other joint pain or joint swelling at this time.  She has 1 small patch of psoriasis behind the left ear which has been unchanged.  She denies any new patches of psoriasis.  She denies any signs or symptoms of uveitis. She denies any recent or recurrent infections.    Activities of Daily Living:  Patient reports morning stiffness for 1 hour.   Patient Denies nocturnal pain.  Difficulty dressing/grooming: Denies Difficulty climbing stairs: Reports Difficulty getting out of chair: Denies Difficulty using hands for taps, buttons, cutlery, and/or writing: Reports  Review of Systems  Constitutional:  Positive for fatigue.  HENT:  Negative for mouth sores and mouth dryness.   Eyes:  Negative for dryness.  Respiratory:  Negative for shortness of breath.   Cardiovascular:  Negative for chest pain and palpitations.  Gastrointestinal:  Negative for blood in stool, constipation and diarrhea.  Endocrine: Negative for increased urination.   Genitourinary:  Negative for involuntary urination.  Musculoskeletal:  Positive for joint pain, joint pain, joint swelling and morning stiffness. Negative for gait problem, myalgias, muscle weakness, muscle tenderness and myalgias.  Skin:  Positive for rash. Negative for color change, hair loss and sensitivity to sunlight.  Allergic/Immunologic: Negative for susceptible to infections.  Neurological:  Negative for dizziness and headaches.  Hematological:  Negative for swollen glands.  Psychiatric/Behavioral:  Negative for depressed mood and sleep disturbance. The patient is nervous/anxious.     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   Hypertension 05/29/2020   History of stomach ulcers    PONV (postoperative nausea and vomiting)    Tachycardia    Palpitations 04/09/2020   Primary hypertension 04/09/2020   GERD (gastroesophageal reflux disease)    Heart murmur    Psoriasis 09/21/2018   Anxiety 09/21/2018   Essential hypertension 09/21/2018   History of kidney stones 09/21/2018   Eustachian tube dysfunction 10/05/2011   History of cholesteatoma 10/05/2011    Past Medical History:  Diagnosis Date   Anxiety 09/21/2018   Essential hypertension 09/21/2018   Eustachian tube dysfunction 10/05/2011   GERD (gastroesophageal reflux disease)    Heart  murmur    no treatment   History of cholesteatoma 10/05/2011   History of kidney stones    History of stomach ulcers    PAF (paroxysmal atrial fibrillation) (HCC) 04/09/2020   Palpitations 04/09/2020   PONV (postoperative nausea and vomiting)    no problem  03/14/12   Primary hypertension 04/09/2020   Psoriasis    Tachycardia     Family History  Problem Relation Age of Onset   Arthritis  Mother    Diabetes Father    Hypertension Father    High Cholesterol Father    Heart disease Father    Heart attack Father    Gout Father    Cirrhosis Father    Healthy Sister    Psoriasis Daughter        psoriatic arthritis   Healthy Daughter    Lymphoma Maternal Grandmother    Cancer Paternal Grandfather    Past Surgical History:  Procedure Laterality Date   COLONOSCOPY     2014, 2019   HOLMIUM LASER APPLICATION Right 03/23/2012   Procedure: HOLMIUM LASER APPLICATION;  Surgeon: Anner Crete, MD;  Location: [facility];  Service: Urology;  Laterality: Right;   INNER EAR SURGERY  2000   LITHOTRIPSY     x2   TONSILLECTOMY     age 49 yrs.   TYMPANOPLASTY W/ MASTOIDECTOMY     x2   UPPER GI ENDOSCOPY     2014, 2019   URETEROSCOPY  2014   Social History   Social History Narrative   Not on file   Immunization History  Administered Date(s) Administered   PFIZER(Purple Top)SARS-COV-2 Vaccination 04/05/2019, 04/24/2019, 10/05/2019   Pneumococcal Conjugate-13 09/27/2018     Objective: Vital Signs: BP (!) 184/95 (BP Location: Left Arm, Patient Position: Sitting, Cuff Size: Normal)   Pulse (!) 53   Resp 14   Ht 5\' 3"  (1.6 m)   Wt 183 lb 3.2 oz (83.1 kg)   BMI 32.45 kg/m    Physical Exam Vitals and nursing note reviewed.  Constitutional:      Appearance: She is well-developed.  HENT:     Head: Normocephalic and atraumatic.  Eyes:     Conjunctiva/sclera: Conjunctivae normal.  Cardiovascular:     Rate and Rhythm: Normal rate and regular rhythm.     Heart sounds: Normal heart sounds.  Pulmonary:     Effort: Pulmonary effort is normal.     Breath sounds: Normal breath sounds.  Abdominal:     General: Bowel sounds are normal.     Palpations: Abdomen is soft.  Musculoskeletal:     Cervical back: Normal range of motion.  Skin:    General: Skin is warm and dry.     Capillary Refill: Capillary refill takes less than 2 seconds.  Neurological:      Mental Status: She is alert and oriented to person, place, and time.  Psychiatric:        Behavior: Behavior normal.      Musculoskeletal Exam: C-spine, thoracic spine, lumbar spine have good range of motion.  No midline spinal tenderness.  Mild SI joint tenderness.  Shoulder joints, elbow joints, wrist joints, MCPs, PIPs, and DIPs good ROM with no synovitis.  Complete fist formation bilaterally.  Hip joints have good ROM with no groin pain.  Knee joints have good ROM with no warmth or effusion.  Ankle joints have good ROM with no synovitis.  Tenderness and mild inflammation at the achilles tendon insertion site bilaterally.  Mild tenderness along the plantar fascia bilaterally.  No tenderness or synovitis of MTP joints.   CDAI Exam: CDAI Score: -- Patient Global: --; Provider Global: -- Swollen: --; Tender: -- Joint Exam 02/16/2022   No joint exam has been documented for this visit   There is currently no information documented on the homunculus. Go to the Rheumatology activity and complete the homunculus joint exam.  Investigation: No additional findings.  Imaging: No results found.  Recent Labs: Lab Results  Component Value Date   WBC 5.6 05/04/2019   HGB 13.1 05/04/2019   PLT 289 05/04/2019   NA 141 06/19/2019   K 4.1 06/19/2019   CL 106 06/19/2019   CO2 30 06/19/2019   GLUCOSE 93 06/19/2019   BUN 13 06/19/2019   CREATININE 0.72 06/19/2019   BILITOT 0.5 06/19/2019   ALKPHOS 73 01/30/2019   AST 17 06/19/2019   ALT 14 06/19/2019   PROT 6.6 06/19/2019   ALBUMIN 4.5 01/30/2019   CALCIUM 8.9 06/19/2019   GFRAA 112 06/19/2019    Speciality Comments: No specialty comments available.  Procedures:  No procedures performed Allergies: Sulfa drugs cross reactors   Assessment / Plan:     Visit Diagnoses: Psoriatic arthritis ([place]): She has no synovitis or dactylitis on examination today.  She has clinically been doing well on Otezla 30 mg 1 tablet by mouth twice daily,  methotrexate 0.7 mL sq injections once weekly, and folic acid 2 mg daily.  She has been tolerating combination therapy without any side effects or missed doses.  She continues to experience discomfort due to Achilles tendinitis of both feet and has mild tenderness along the plantar fascia bilaterally.  No new patches of psoriasis.  No signs or symptoms of uveitis.  Mild tenderness over both SI joints.  Occasional stiffness in both hands but no synovitis or tenderness noted.  She will remain on Otezla and methotrexate as combination therapy.  Her treatment options remain limited since she is self-pay and is not eligible for patient assistance. Samples of otezla were provided to the patient today. She was advised to notify us if she develops signs or symptoms of a flare.  She will follow-up in the office in 5 months or sooner if needed.  Psoriasis-One small patch of plaque psoriasis behind the left ear-unchanged.  No new patches.  She will remain on combination therapy as prescribed.    High risk medication use - Otezla 30 mg 1 tablet by mouth twice daily, Methotrexate 0.7 ml sq injections once weekly, and folic acid 2 mg po daily. Samples of Rutherford Nail were provided to the patient today. Treatment options are limited since the patient is self-pay. CBC and CMP WNL on 11/19/21.  Patient would like to have updated lab work drawn at her PCPs office this month.  She will have results forwarded to our office.  She will continue to require updated lab work every 3 months. Discussed the importance of holding MTX if she develops signs or symptoms of an infection and to resume once the infection has completely cleared.  She received the annual flu shot.  Pain in thoracic spine: No midline spinal tenderness.    Pes cavus: She tries to wear shoes with arch support.   Plantar fasciitis, bilateral: Mild tenderness along the plantar aspect of both feet.  Tenderness over the Achilles tendon noted bilaterally.  Achilles  tendinitis, bilateral: She has tenderness to palpation over bilateral Achilles tendons.  Discussed the importance of wearing proper fitting shoes.  Patient will remain on the current treatment regimen.  She was advised to notify us if her symptoms persist or worsen.  Chronic SI joint pain: Mild SI joint tenderness bilaterally.   Other medical conditions are listed as follows:   Essential hypertension: BP was elevated today in the office and was rechecked prior to the patient leaving.  Advised patient to monitor blood pressure closely and to follow up with PCP if BP remains elevated.   History of kidney stones  Solar elastosis  Anxiety  PVC's (premature ventricular contractions) - Followed by Dr. Servando Salina. She had an echocardiogram performed on 04/16/2020 which revealed mild left ventricular thickening.  PAF (paroxysmal atrial fibrillation) (HCC)  Orders: No orders of the defined types were placed in this encounter.  No orders of the defined types were placed in this encounter.    Follow-Up Instructions: Return in about 5 months (around 07/18/2022) for Psoriatic arthritis.   Gearldine Bienenstock, PA-C  Note - This record has been created using Dragon software.  Chart creation errors have been sought, but may not always  have been located. Such creation errors do not reflect on  the standard of medical care.

## 2022-02-16 ENCOUNTER — Ambulatory Visit: Payer: Self-pay | Attending: Physician Assistant | Admitting: Physician Assistant

## 2022-02-16 ENCOUNTER — Encounter: Payer: Self-pay | Admitting: Physician Assistant

## 2022-02-16 VITALS — BP 184/95 | HR 53 | Resp 14 | Ht 63.0 in | Wt 183.2 lb

## 2022-02-16 DIAGNOSIS — I480 Paroxysmal atrial fibrillation: Secondary | ICD-10-CM

## 2022-02-16 DIAGNOSIS — M7661 Achilles tendinitis, right leg: Secondary | ICD-10-CM

## 2022-02-16 DIAGNOSIS — I10 Essential (primary) hypertension: Secondary | ICD-10-CM

## 2022-02-16 DIAGNOSIS — G8929 Other chronic pain: Secondary | ICD-10-CM

## 2022-02-16 DIAGNOSIS — Z79899 Other long term (current) drug therapy: Secondary | ICD-10-CM

## 2022-02-16 DIAGNOSIS — L4050 Arthropathic psoriasis, unspecified: Secondary | ICD-10-CM

## 2022-02-16 DIAGNOSIS — M546 Pain in thoracic spine: Secondary | ICD-10-CM

## 2022-02-16 DIAGNOSIS — M7662 Achilles tendinitis, left leg: Secondary | ICD-10-CM

## 2022-02-16 DIAGNOSIS — L578 Other skin changes due to chronic exposure to nonionizing radiation: Secondary | ICD-10-CM

## 2022-02-16 DIAGNOSIS — F419 Anxiety disorder, unspecified: Secondary | ICD-10-CM

## 2022-02-16 DIAGNOSIS — Q667 Congenital pes cavus, unspecified foot: Secondary | ICD-10-CM

## 2022-02-16 DIAGNOSIS — M533 Sacrococcygeal disorders, not elsewhere classified: Secondary | ICD-10-CM

## 2022-02-16 DIAGNOSIS — L409 Psoriasis, unspecified: Secondary | ICD-10-CM

## 2022-02-16 DIAGNOSIS — Z87442 Personal history of urinary calculi: Secondary | ICD-10-CM

## 2022-02-16 DIAGNOSIS — M722 Plantar fascial fibromatosis: Secondary | ICD-10-CM

## 2022-02-16 DIAGNOSIS — I493 Ventricular premature depolarization: Secondary | ICD-10-CM

## 2022-02-16 NOTE — Patient Instructions (Signed)
Standing Labs We placed an order today for your standing lab work.   Please have your standing labs drawn in January and every 3 months   CBC with diff and CMP with GFR   Please have your labs drawn 2 weeks prior to your appointment so that the provider can discuss your lab results at your appointment.  Please note that you may see your imaging and lab results in [facility] before we have reviewed them. We will contact you once all results are reviewed. Please allow our office up to 72 hours to thoroughly review all of the results before contacting the office for clarification of your results.  Lab hours are:   Monday through Thursday from 8:00 am -12:30 pm and 1:00 pm-5:00 pm and Friday from 8:00 am-12:00 pm.  Please be advised, all patients with office appointments requiring lab work will take precedent over walk-in lab work.   Labs are drawn by [facility]. Please bring your co-pay at the time of your lab draw.  You may receive a bill from [facility] for your lab work.  Please note if you are on Hydroxychloroquine and and an order has been placed for a Hydroxychloroquine level, you will need to have it drawn 4 hours or more after your last dose.  If you wish to have your labs drawn at another location, please call the office 24 hours in advance so we can fax the orders.  The office is located at [address], [place], [place], [place] [address] No appointment is necessary.    If you have any questions regarding directions or hours of operation,  please call [phone].   As a reminder, please drink plenty of water prior to coming for your lab work. Thanks!  If you have signs or symptoms of an infection or start antibiotics: First, call your PCP for workup of your infection. Hold your medication through the infection, until you complete your antibiotics, and until symptoms resolve if you take the following: Injectable medication (Actemra, Benlysta, Cimzia, Cosentyx, Enbrel, Humira,  Kevzara, Orencia, Remicade, Simponi, Stelara, Taltz, Tremfya) Methotrexate Leflunomide (Arava) Mycophenolate (Cellcept) Morrie Sheldon, Olumiant, or Rinvoq   Vaccines You are taking a medication(s) that can suppress your immune system.  The following immunizations are recommended: Flu annually Covid-19  Td/Tdap (tetanus, diphtheria, pertussis) every 10 years Pneumonia (Prevnar 15 then Pneumovax 23 at least 1 year apart.  Alternatively, can take Prevnar 20 without needing additional dose) Shingrix: 2 doses from 4 weeks to 6 months apart  Please check with your PCP to make sure you are up to date.

## 2022-02-16 NOTE — Progress Notes (Signed)
Medication Samples have been provided to the patient.  Drug name: Rutherford Nail       Strength: 30mg         Qty: 2 boxes  LOT: [id number]  Exp.Date: 10/02/2023  Dosing instructions: Take 1 tablet by mouth twice daily.

## 2022-03-29 ENCOUNTER — Telehealth: Payer: Self-pay | Admitting: *Deleted

## 2022-03-29 NOTE — Telephone Encounter (Signed)
Medication Samples have been provided to the patient.  Drug name: Rutherford Nail Strength: 30 mg Qty: 2 boxes LOT: [id number] Exp.Date: 02 Oct 2023  Dosing instructions: Take 1 tablet by mouth twice daily.

## 2022-04-14 ENCOUNTER — Other Ambulatory Visit: Payer: Self-pay | Admitting: Cardiology

## 2022-05-19 ENCOUNTER — Other Ambulatory Visit: Payer: Self-pay | Admitting: Physician Assistant

## 2022-05-19 DIAGNOSIS — L4050 Arthropathic psoriasis, unspecified: Secondary | ICD-10-CM

## 2022-05-26 ENCOUNTER — Encounter: Payer: Self-pay | Admitting: *Deleted

## 2022-05-26 ENCOUNTER — Other Ambulatory Visit: Payer: Self-pay | Admitting: Physician Assistant

## 2022-05-26 DIAGNOSIS — L4050 Arthropathic psoriasis, unspecified: Secondary | ICD-10-CM

## 2022-05-26 LAB — LAB REPORT - SCANNED: EGFR: 106

## 2022-05-27 ENCOUNTER — Telehealth: Payer: Self-pay | Admitting: *Deleted

## 2022-05-27 NOTE — Telephone Encounter (Signed)
Labs received from:[facility]  Drawn on:05/26/2022  Reviewed by:Sherron Ales, PA-C  Labs drawn:Lipid Panel, CBC, CMP  Results:NHDL 130  Glucose 105  Patient is on Otezla 30 mg BID and MTX 0.8 Ml SQ weekly.

## 2022-05-28 ENCOUNTER — Other Ambulatory Visit: Payer: Self-pay | Admitting: *Deleted

## 2022-05-28 DIAGNOSIS — L4050 Arthropathic psoriasis, unspecified: Secondary | ICD-10-CM

## 2022-05-28 MED ORDER — METHOTREXATE SODIUM CHEMO INJECTION 50 MG/2ML: INTRAMUSCULAR | 0 refills | Status: DC

## 2022-05-28 NOTE — Telephone Encounter (Signed)
Last Fill: 07/27/2022  Labs: Labs received from:[facility]   Drawn on:05/26/2022   Reviewed by:Sherron Ales, PA-C   Labs drawn:Lipid Panel, CBC, CMP   Results:NHDL 130  Glucose 105   Patient is on Otezla 30 mg BID and MTX 0.8 Ml SQ weekly.    Next Visit: 07/27/2022  Last Visit: 02/16/2022  DX: Psoriatic arthritis   Current Dose per office note 02/16/2022: Methotrexate 0.7 ml sq injections once weekly,   Okay to refill Methotrexate?

## 2022-05-31 ENCOUNTER — Telehealth: Payer: Self-pay | Admitting: Pharmacist

## 2022-05-31 NOTE — Telephone Encounter (Signed)
Medication Samples have been provided to the patient.  Drug name: Henderson [place] 30mg  tabs Qty: 56 tabs LOT: [id number] Exp.Date: 10/02/2023  Dosing instructions: Take one tab by mouth twice daily  The patient has been instructed regarding the correct time, dose, and frequency of taking this medication, including desired effects and most common side effects.   Murrell Redden 8:39 AM 05/31/2022

## 2022-06-17 ENCOUNTER — Other Ambulatory Visit: Payer: Self-pay | Admitting: Cardiology

## 2022-06-30 ENCOUNTER — Other Ambulatory Visit: Payer: Self-pay | Admitting: Physician Assistant

## 2022-06-30 DIAGNOSIS — L4050 Arthropathic psoriasis, unspecified: Secondary | ICD-10-CM

## 2022-06-30 NOTE — Telephone Encounter (Signed)
Last Fill: 04/06/2021  Next Visit: 07/07/2022  Last Visit: 02/16/2022  Dx:  Psoriatic arthritis   Current Dose per office note on 02/16/2022: folic acid 2 mg daily   Okay to refill Folic Acid?

## 2022-07-05 ENCOUNTER — Telehealth: Payer: Self-pay | Admitting: *Deleted

## 2022-07-05 NOTE — Telephone Encounter (Signed)
Patient contacted the office requesting sample of Otezla. Patient advised she may come by the office to pick up her sample. Sample reserved in the sample cabinet for patient. Patient states she will come by this week to pick up sample.

## 2022-07-07 NOTE — Telephone Encounter (Signed)
Medication Samples have been provided to the patient.  Drug name: Henderson Baltimore       Strength: 30 mg        Qty: 2  LOT: [id number]   Exp.Date: 01/01/2024  Dosing instructions: Take one tab by mouth twice daily.

## 2022-07-13 NOTE — Progress Notes (Deleted)
Office Visit Note  Patient: Frances Watkins             Date of Birth: 1965-03-24           MRN: [id number]             PCP: Karilyn Cota, NP Referring: Karilyn Cota, NP Visit Date: 07/27/2022 Occupation: @GUAROCC @  Subjective:  No chief complaint on file.   History of Present Illness: Frances Watkins is a 57 y.o. female ***     Activities of Daily Living:  Patient reports morning stiffness for *** {minute/hour:19697}.   Patient {ACTIONS;DENIES/REPORTS:21021675::"Denies"} nocturnal pain.  Difficulty dressing/grooming: {ACTIONS;DENIES/REPORTS:21021675::"Denies"} Difficulty climbing stairs: {ACTIONS;DENIES/REPORTS:21021675::"Denies"} Difficulty getting out of chair: {ACTIONS;DENIES/REPORTS:21021675::"Denies"} Difficulty using hands for taps, buttons, cutlery, and/or writing: {ACTIONS;DENIES/REPORTS:21021675::"Denies"}  No Rheumatology ROS completed.   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   Hypertension 05/29/2020   History of stomach ulcers    PONV (postoperative nausea and vomiting)    Tachycardia    Palpitations 04/09/2020   Primary hypertension 04/09/2020   GERD (gastroesophageal reflux disease)    Heart murmur    Psoriasis 09/21/2018   Anxiety 09/21/2018   Essential hypertension 09/21/2018   History of kidney stones 09/21/2018   Eustachian tube dysfunction 10/05/2011   History of cholesteatoma 10/05/2011    Past Medical History:  Diagnosis Date   Anxiety 09/21/2018   Essential hypertension 09/21/2018   Eustachian tube dysfunction 10/05/2011   GERD (gastroesophageal reflux disease)    Heart murmur    no treatment   History of cholesteatoma 10/05/2011   History of kidney stones    History of  stomach ulcers    PAF (paroxysmal atrial fibrillation) (HCC) 04/09/2020   Palpitations 04/09/2020   PONV (postoperative nausea and vomiting)    no problem  03/14/12   Primary hypertension 04/09/2020   Psoriasis    Tachycardia     Family History  Problem Relation Age of Onset   Arthritis Mother    Diabetes Father    Hypertension Father    High Cholesterol Father    Heart disease Father    Heart attack Father    Gout Father    Cirrhosis Father    Healthy Sister    Psoriasis Daughter        psoriatic arthritis   Healthy Daughter    Lymphoma Maternal Grandmother    Cancer Paternal Grandfather    Past Surgical History:  Procedure Laterality Date   COLONOSCOPY     2014, 2019   HOLMIUM LASER APPLICATION Right 03/23/2012   Procedure: HOLMIUM LASER APPLICATION;  Surgeon: Anner Crete, MD;  Location: [facility];  Service: Urology;  Laterality: Right;   INNER EAR SURGERY  2000   LITHOTRIPSY     x2   TONSILLECTOMY     age 12 yrs.   TYMPANOPLASTY W/ MASTOIDECTOMY     x2   UPPER GI ENDOSCOPY     2014, 2019   URETEROSCOPY  2014   Social History   Social History Narrative   Not on file   Immunization History  Administered Date(s) Administered   PFIZER(Purple Top)SARS-COV-2 Vaccination 04/05/2019, 04/24/2019, 10/05/2019   Pneumococcal Conjugate-13 09/27/2018     Objective: Vital Signs: There were no vitals taken  for this visit.   Physical Exam   Musculoskeletal Exam: ***  CDAI Exam: CDAI Score: -- Patient Global: --; Provider Global: -- Swollen: --; Tender: -- Joint Exam 07/27/2022   No joint exam has been documented for this visit   There is currently no information documented on the homunculus. Go to the Rheumatology activity and complete the homunculus joint exam.  Investigation: No additional findings.  Imaging: No results found.  Recent Labs: Lab Results  Component Value Date   WBC 5.6 05/04/2019   HGB 13.1 05/04/2019   PLT 289 05/04/2019    NA 141 06/19/2019   K 4.1 06/19/2019   CL 106 06/19/2019   CO2 30 06/19/2019   GLUCOSE 93 06/19/2019   BUN 13 06/19/2019   CREATININE 0.72 06/19/2019   BILITOT 0.5 06/19/2019   ALKPHOS 73 01/30/2019   AST 17 06/19/2019   ALT 14 06/19/2019   PROT 6.6 06/19/2019   ALBUMIN 4.5 01/30/2019   CALCIUM 8.9 06/19/2019   GFRAA 112 06/19/2019    Speciality Comments: No specialty comments available.  Procedures:  No procedures performed Allergies: Sulfa drugs cross reactors   Assessment / Plan:     Visit Diagnoses: No diagnosis found.  Orders: No orders of the defined types were placed in this encounter.  No orders of the defined types were placed in this encounter.   Face-to-face time spent with patient was *** minutes. Greater than 50% of time was spent in counseling and coordination of care.  Follow-Up Instructions: No follow-ups on file.   Ellen Henri, CMA  Note - This record has been created using Animal nutritionist.  Chart creation errors have been sought, but may not always  have been located. Such creation errors do not reflect on  the standard of medical care.

## 2022-07-15 ENCOUNTER — Other Ambulatory Visit: Payer: Self-pay | Admitting: Cardiology

## 2022-07-27 ENCOUNTER — Ambulatory Visit: Payer: Self-pay | Admitting: Rheumatology

## 2022-07-27 DIAGNOSIS — I480 Paroxysmal atrial fibrillation: Secondary | ICD-10-CM

## 2022-07-27 DIAGNOSIS — M722 Plantar fascial fibromatosis: Secondary | ICD-10-CM

## 2022-07-27 DIAGNOSIS — L578 Other skin changes due to chronic exposure to nonionizing radiation: Secondary | ICD-10-CM

## 2022-07-27 DIAGNOSIS — Z79899 Other long term (current) drug therapy: Secondary | ICD-10-CM

## 2022-07-27 DIAGNOSIS — Q667 Congenital pes cavus, unspecified foot: Secondary | ICD-10-CM

## 2022-07-27 DIAGNOSIS — L409 Psoriasis, unspecified: Secondary | ICD-10-CM

## 2022-07-27 DIAGNOSIS — F419 Anxiety disorder, unspecified: Secondary | ICD-10-CM

## 2022-07-27 DIAGNOSIS — Z87442 Personal history of urinary calculi: Secondary | ICD-10-CM

## 2022-07-27 DIAGNOSIS — G8929 Other chronic pain: Secondary | ICD-10-CM

## 2022-07-27 DIAGNOSIS — I10 Essential (primary) hypertension: Secondary | ICD-10-CM

## 2022-07-27 DIAGNOSIS — I493 Ventricular premature depolarization: Secondary | ICD-10-CM

## 2022-07-27 DIAGNOSIS — L4050 Arthropathic psoriasis, unspecified: Secondary | ICD-10-CM

## 2022-07-27 DIAGNOSIS — M546 Pain in thoracic spine: Secondary | ICD-10-CM

## 2022-07-27 DIAGNOSIS — M7661 Achilles tendinitis, right leg: Secondary | ICD-10-CM

## 2022-07-27 NOTE — Progress Notes (Signed)
Office Visit Note  Patient: Frances Watkins             Date of Birth: Jan 23, 1966           MRN: [id number]             PCP: Karilyn Cota, NP Referring: Karilyn Cota, NP Visit Date: 08/09/2022 Occupation: @GUAROCC @  Subjective:  Medication monitoring  History of Present Illness: RUAH NOETH is a 57 y.o. female with history of psoriatic arthritis.  Patient remains on Otezla 30 mg 1 tablet by mouth twice daily, Methotrexate 0.7 ml sq injections once weekly, and folic acid 2 mg po daily.  She is tolerating combination therapy without any side effects.  Patient continues to experience intermittent discomfort in both SI joints as well as bilateral Achilles tendons.  She denies any symptoms of plantar fasciitis.  Patient states that she is unsure if Henderson [place] is providing any relief.  She denies any recent or recurrent infections.       Activities of Daily Living:  Patient reports morning stiffness for 1.5-2 hours.   Patient Reports nocturnal pain.  Difficulty dressing/grooming: Denies Difficulty climbing stairs: Reports Difficulty getting out of chair: Denies Difficulty using hands for taps, buttons, cutlery, and/or writing: Denies  Review of Systems  Constitutional:  Negative for fatigue.  HENT:  Negative for mouth sores and mouth dryness.   Eyes:  Negative for dryness.  Respiratory:  Negative for shortness of breath.   Cardiovascular:  Positive for palpitations. Negative for chest pain.  Gastrointestinal:  Negative for blood in stool, constipation and diarrhea.  Endocrine: Negative for increased urination.  Genitourinary:  Negative for involuntary urination.  Musculoskeletal:  Positive for joint pain, joint pain, joint swelling and morning stiffness. Negative for gait problem, myalgias, muscle weakness, muscle tenderness and myalgias.  Skin:  Positive for rash. Negative for color change, hair loss and sensitivity to sunlight.  Allergic/Immunologic: Negative for  susceptible to infections.  Neurological:  Negative for dizziness and headaches.  Hematological:  Negative for swollen glands.  Psychiatric/Behavioral:  Negative for depressed mood and sleep disturbance. The patient is not nervous/anxious.     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   Hypertension 05/29/2020   History of stomach ulcers    PONV (postoperative nausea and vomiting)    Tachycardia    Palpitations 04/09/2020   Primary hypertension 04/09/2020   GERD (gastroesophageal reflux disease)    Heart murmur    Psoriasis 09/21/2018   Anxiety 09/21/2018   Essential hypertension 09/21/2018   History of kidney stones 09/21/2018   Eustachian tube dysfunction 10/05/2011   History of cholesteatoma 10/05/2011    Past Medical History:  Diagnosis Date   Anxiety 09/21/2018   Essential hypertension 09/21/2018   Eustachian tube dysfunction 10/05/2011   GERD (gastroesophageal reflux disease)    Heart murmur    no treatment   History of cholesteatoma 10/05/2011   History of kidney stones    History of stomach ulcers    PAF (paroxysmal atrial fibrillation) (HCC) 04/09/2020   Palpitations 04/09/2020   PONV (postoperative nausea and vomiting)    no problem  03/14/12   Primary hypertension 04/09/2020   Psoriasis    Tachycardia     Family History  Problem Relation Age of Onset   Arthritis Mother    Diabetes Father    Hypertension Father    High Cholesterol Father    Heart disease Father    Heart attack Father    Gout Father    Cirrhosis Father    Healthy Sister    Psoriasis Daughter        psoriatic arthritis   Healthy Daughter    Lymphoma Maternal Grandmother    Cancer Paternal Grandfather    Past Surgical History:  Procedure  Laterality Date   COLONOSCOPY     2014, 2019   HOLMIUM LASER APPLICATION Right 03/23/2012   Procedure: HOLMIUM LASER APPLICATION;  Surgeon: Anner Crete, MD;  Location: [facility];  Service: Urology;  Laterality: Right;   INNER EAR SURGERY  2000   LITHOTRIPSY     x2   TONSILLECTOMY     age 56 yrs.   TYMPANOPLASTY W/ MASTOIDECTOMY     x2   UPPER GI ENDOSCOPY     2014, 2019   URETEROSCOPY  2014   Social History   Social History Narrative   Not on file   Immunization History  Administered Date(s) Administered   PFIZER(Purple Top)SARS-COV-2 Vaccination 04/05/2019, 04/24/2019, 10/05/2019   Pneumococcal Conjugate-13 09/27/2018     Objective: Vital Signs: BP (!) 172/82 (BP Location: Left Arm, Patient Position: Sitting, Cuff Size: Normal)   Pulse (!) 37   Resp 14   Ht 5\' 3"  (1.6 m)   Wt 179 lb (81.2 kg)   BMI 31.71 kg/m    Physical Exam Vitals and nursing note reviewed.  Constitutional:      Appearance: She is well-developed.  HENT:     Head: Normocephalic and atraumatic.  Eyes:     Conjunctiva/sclera: Conjunctivae normal.  Cardiovascular:     Rate and Rhythm: Normal rate and regular rhythm.     Heart sounds: Normal heart sounds.  Pulmonary:     Effort: Pulmonary effort is normal.     Breath sounds: Normal breath sounds.  Abdominal:     General: Bowel sounds are normal.     Palpations: Abdomen is soft.  Musculoskeletal:     Cervical back: Normal range of motion.  Lymphadenopathy:     Cervical: No cervical adenopathy.  Skin:    General: Skin is warm and dry.     Capillary Refill: Capillary refill takes less than 2 seconds.  Neurological:     Mental Status: She is alert and oriented to person, place, and time.  Psychiatric:        Behavior: Behavior normal.      Musculoskeletal Exam: C-spine, thoracic spine, lumbar spine have good range of motion.  No midline spinal tenderness.  Some tenderness over both SI joints.  Shoulder joints, elbow joints,  wrist joints, MCPs, PIPs, DIPs have good range of motion with no synovitis.  Complete fist formation bilaterally.  Hip joints have good range of motion with no groin pain.  Knee joints have good range of motion with no warmth or effusion.  Ankle joints have good range of motion with no tenderness or synovitis along the ankle joint line.  Inflammation at the base of the Achilles tendon bilaterally.  No evidence of plantar fasciitis.  No tenderness over the MTP joints.  No dactylitis noted.  CDAI Exam: CDAI Score: -- Patient Global: --; Provider Global: -- Swollen: --; Tender: -- Joint Exam 08/09/2022   No joint exam has been documented for this visit  There is currently no information documented on the homunculus. Go to the Rheumatology activity and complete the homunculus joint exam.  Investigation: No additional findings.  Imaging: No results found.  Recent Labs: Lab Results  Component Value Date   WBC 5.6 05/04/2019   HGB 13.1 05/04/2019   PLT 289 05/04/2019   NA 141 06/19/2019   K 4.1 06/19/2019   CL 106 06/19/2019   CO2 30 06/19/2019   GLUCOSE 93 06/19/2019   BUN 13 06/19/2019   CREATININE 0.72 06/19/2019   BILITOT 0.5 06/19/2019   ALKPHOS 73 01/30/2019   AST 17 06/19/2019   ALT 14 06/19/2019   PROT 6.6 06/19/2019   ALBUMIN 4.5 01/30/2019   CALCIUM 8.9 06/19/2019   GFRAA 112 06/19/2019    Speciality Comments: No specialty comments available.  Procedures:  No procedures performed Allergies: Sulfa drugs cross reactors   Assessment / Plan:     Visit Diagnoses: Psoriatic arthritis (HCC): No synovitis or dactylitis noted on examination today.  She continues to experience discomfort and stiffness due to Achilles tendinitis in both feet.  No evidence of plantar fasciitis at this time.  She also has intermittent discomfort in both SI joints.  Tenderness over both SI joints noted today.  She remains on methotrexate 0.7 mL sq injections once weekly along with Otezla 30 mg 1  tablet twice daily.  She is tolerating combination therapy without any side effects.  She is unsure if [place] is providing any benefit as she still remains symptomatic.  Her treatment options are limited given that she is self-pay/under a Ecologist.  Discussed possibly switching from [place] to [place] in the future. For now no medication changes will be made.  She was advised to notify us if she develops more frequent flares.  She will follow-up in the office in 5 months or sooner if needed.  Psoriasis: She has a few small patches of psoriasis along her hairline and behind both ears.  She has not developed any new patches on the current treatment regimen.  She has not needed to use any topical agents recently.  She remains on Otezla and methotrexate as combination therapy.  High risk medication use - Otezla 30 mg 1 tablet by mouth twice daily, Methotrexate 0.7 ml sq injections once weekly, and folic acid 2 mg po daily. CBC and CMP updated on 05/26/22. She has an upcoming appointment with her PCP on 08/24/22 and will be having updated lab work at that time.  No recent or recurrent infections. Discussed the importance of holding methotrexate if she develops signs or symptoms of an infection and to resume once the infection has completely cleared.   Pain in thoracic spine: No midline spinal tenderness in the thoracic region at this time.  Pes cavus  Plantar fasciitis, bilateral: Not currently symptomatic.  No tenderness along the plantar fascia at this time.  Achilles tendonitis, bilateral: She continues to have some tenderness at the base of the Achilles tendon bilaterally.  She will remain on Otezla and methotrexate as prescribed.  Discussed possibly switching from [place] to [place] [facility] [place] but she has declined at this time.  Chronic SI joint pain: She has intermittent SI joint pain bilaterally.  Other medical conditions are listed as follows:  Essential hypertension: Blood pressure was elevated  today in the office and was rechecked prior to leaving.  Patient was advised to follow-up with her PCP if her blood pressure remains elevated.  History of kidney stones  Solar elastosis  Anxiety  PVC's (  premature ventricular contractions)  PAF (paroxysmal atrial fibrillation) (HCC)  Orders: No orders of the defined types were placed in this encounter.  No orders of the defined types were placed in this encounter.   Follow-Up Instructions: Return in about 5 months (around 01/09/2023) for Psoriatic arthritis.   Gearldine Bienenstock, PA-C  Note - This record has been created using Dragon software.  Chart creation errors have been sought, but may not always  have been located. Such creation errors do not reflect on  the standard of medical care.

## 2022-08-02 ENCOUNTER — Ambulatory Visit: Payer: Self-pay | Admitting: Cardiology

## 2022-08-09 ENCOUNTER — Encounter: Payer: Self-pay | Admitting: Physician Assistant

## 2022-08-09 ENCOUNTER — Ambulatory Visit: Payer: Self-pay | Attending: Rheumatology | Admitting: Physician Assistant

## 2022-08-09 ENCOUNTER — Telehealth: Payer: Self-pay | Admitting: *Deleted

## 2022-08-09 VITALS — BP 172/82 | HR 37 | Resp 14 | Ht 63.0 in | Wt 179.0 lb

## 2022-08-09 DIAGNOSIS — G8929 Other chronic pain: Secondary | ICD-10-CM

## 2022-08-09 DIAGNOSIS — Q667 Congenital pes cavus, unspecified foot: Secondary | ICD-10-CM

## 2022-08-09 DIAGNOSIS — F419 Anxiety disorder, unspecified: Secondary | ICD-10-CM

## 2022-08-09 DIAGNOSIS — M546 Pain in thoracic spine: Secondary | ICD-10-CM

## 2022-08-09 DIAGNOSIS — I493 Ventricular premature depolarization: Secondary | ICD-10-CM

## 2022-08-09 DIAGNOSIS — L409 Psoriasis, unspecified: Secondary | ICD-10-CM

## 2022-08-09 DIAGNOSIS — L4050 Arthropathic psoriasis, unspecified: Secondary | ICD-10-CM

## 2022-08-09 DIAGNOSIS — M722 Plantar fascial fibromatosis: Secondary | ICD-10-CM

## 2022-08-09 DIAGNOSIS — I480 Paroxysmal atrial fibrillation: Secondary | ICD-10-CM

## 2022-08-09 DIAGNOSIS — M7661 Achilles tendinitis, right leg: Secondary | ICD-10-CM

## 2022-08-09 DIAGNOSIS — M7662 Achilles tendinitis, left leg: Secondary | ICD-10-CM

## 2022-08-09 DIAGNOSIS — I10 Essential (primary) hypertension: Secondary | ICD-10-CM

## 2022-08-09 DIAGNOSIS — Z87442 Personal history of urinary calculi: Secondary | ICD-10-CM

## 2022-08-09 DIAGNOSIS — L578 Other skin changes due to chronic exposure to nonionizing radiation: Secondary | ICD-10-CM

## 2022-08-09 DIAGNOSIS — Z79899 Other long term (current) drug therapy: Secondary | ICD-10-CM

## 2022-08-09 DIAGNOSIS — M533 Sacrococcygeal disorders, not elsewhere classified: Secondary | ICD-10-CM

## 2022-08-09 NOTE — Telephone Encounter (Signed)
Medication Samples have been provided to the patient.   Drug name: Henderson [place] Strength: 30 mg  Qty: 2  LOT: [id number]   Exp.Date: 01/01/2024   Dosing instructions: Take one tab by mouth twice daily.

## 2022-08-10 NOTE — Progress Notes (Unsigned)
  Cardiology Office Note:  .   Date:  08/10/2022  ID:  Frances Watkins, DOB [birth year], MRN [id number] PCP: Karilyn Cota, NP  [facility] Providers Cardiologist:  Thomasene Ripple, DO Electrophysiologist:  Will Jorja Loa, MD { Click to update primary MD,subspecialty MD or APP then REFRESH:1}   History of Present Illness: .   Frances Watkins is a 57 y.o. female with a past medical history of hypertension, symptomatic PVCs, AV nodal reentry tachycardia, atrial fibrillation, GERD, psoriatic arthritis.  05/02/2020 monitor paroxysmal symptomatic SVT 04/16/2020 echocardiogram EF 60 to 65%, mild concentric LVH, no valvular abnormalities.  Most recently evaluated by Dr. Elberta Fortis on 12/21/2021, at this time she was doing well from a cardiac perspective, she had had no recurrence of atrial fibrillation, felt to be a good candidate for ablation however she was uninsured at that time.  ROS: ***  Studies Reviewed: .        *** Risk Assessment/Calculations:   {Does this patient have ATRIAL FIBRILLATION?[phone]}         Physical Exam:   VS:  There were no vitals taken for this visit.   Wt Readings from Last 3 Encounters:  08/09/22 179 lb (81.2 kg)  02/16/22 183 lb 3.2 oz (83.1 kg)  12/21/21 188 lb (85.3 kg)    GEN: Well nourished, well developed in no acute distress NECK: No JVD; No carotid bruits CARDIAC: ***RRR, no murmurs, rubs, gallops RESPIRATORY:  Clear to auscultation without rales, wheezing or rhonchi  ABDOMEN: Soft, non-tender, non-distended EXTREMITIES:  No edema; No deformity   ASSESSMENT AND PLAN: .   ***    {Are you ordering a CV Procedure (e.g. stress test, cath, DCCV, TEE, etc)?   Press F2        :[id number]}  Dispo: ***  Signed, Flossie Dibble, NP

## 2022-08-11 ENCOUNTER — Ambulatory Visit: Payer: Self-pay | Attending: Cardiology | Admitting: Cardiology

## 2022-08-11 ENCOUNTER — Encounter: Payer: Self-pay | Admitting: Cardiology

## 2022-08-11 VITALS — BP 122/82 | HR 51 | Ht 63.0 in | Wt 177.4 lb

## 2022-08-11 DIAGNOSIS — I480 Paroxysmal atrial fibrillation: Secondary | ICD-10-CM

## 2022-08-11 DIAGNOSIS — Z79899 Other long term (current) drug therapy: Secondary | ICD-10-CM

## 2022-08-11 NOTE — Patient Instructions (Signed)
Medication Instructions:  Your physician recommends that you continue on your current medications as directed. Please refer to the Current Medication list given to you today.  *If you need a refill on your cardiac medications before your next appointment, please call your pharmacy*   Lab Work: NONE If you have labs (blood work) drawn today and your tests are completely normal, you will receive your results only by: MyChart Message (if you have MyChart) OR A paper copy in the mail If you have any lab test that is abnormal or we need to change your treatment, we will call you to review the results.   Testing/Procedures: NONE   Follow-Up: At [facility], you and your health needs are our priority.  As part of our continuing mission to provide you with exceptional heart care, we have created designated Provider Care Teams.  These Care Teams include your primary Cardiologist (physician) and Advanced Practice Providers (APPs -  Physician Assistants and Nurse Practitioners) who all work together to provide you with the care you need, when you need it.  We recommend signing up for the patient portal called "MyChart".  Sign up information is provided on this After Visit Summary.  MyChart is used to connect with patients for Virtual Visits (Telemedicine).  Patients are able to view lab/test results, encounter notes, upcoming appointments, etc.  Non-urgent messages can be sent to your provider as well.   To learn more about what you can do with MyChart, go to [email or web address].    Your next appointment:   1 year(s)  Provider:   Dr. Vincent Gros    Other Instructions

## 2022-09-13 ENCOUNTER — Telehealth: Payer: Self-pay | Admitting: *Deleted

## 2022-09-13 NOTE — Telephone Encounter (Signed)
Labs received from:[facility]  Drawn on: 08/10/2022  Reviewed by:Sherron Ales, PA-C  Labs drawn:CBC, CMP. Hgb A1C  Results: WNL

## 2022-09-14 ENCOUNTER — Telehealth: Payer: Self-pay | Admitting: *Deleted

## 2022-09-14 NOTE — Telephone Encounter (Signed)
Patient contacted the office requesting a sample of Otezla.   Last Fill: 08/09/2022 (Last Sample provided)  Labs: 08/10/2022 WNL  Next Visit: 01/10/2023  Last Visit: 08/09/2022  DX: Psoriatic arthritis   Current Dose per office note 08/09/2022: [place] 30 mg 1 tablet twice daily   Okay to provide sample of Otezla?

## 2022-09-14 NOTE — Telephone Encounter (Signed)
Sample reserved in cabinet for patient.

## 2022-09-14 NOTE — Telephone Encounter (Signed)
ok 

## 2022-09-15 NOTE — Telephone Encounter (Signed)
Medication Samples have been provided to the patient.  Drug name: Henderson [place]       Strength: 30 mg        Qty: 2 LOT: [id number]  Exp.Date: 01/01/2024  Dosing instructions: Take one tablet by mouth twice daily.

## 2022-10-16 ENCOUNTER — Other Ambulatory Visit: Payer: Self-pay | Admitting: Cardiology

## 2022-10-19 ENCOUNTER — Telehealth: Payer: Self-pay | Admitting: *Deleted

## 2022-10-19 NOTE — Telephone Encounter (Signed)
Patient contacted the office requesting a sample for [facility]. Patient advised she may come by for a sample and it has been reserved in the cabinet for patient.   Patient advised we will no longer be able to provide samples as we will no longer be getting them. Patient has been scheduled for a sooner office visit on 11/22/2022 to discuss.

## 2022-10-20 NOTE — Telephone Encounter (Signed)
Medication Samples have been provided to the patient.  Drug name: Henderson [place]       Strength: 30 mg        Qty: 2 LOT: [id number]  Exp.Date: 01/01/2024  Dosing instructions: Take one tablet by mouth twice daily.

## 2022-11-02 LAB — LAB REPORT - SCANNED: EGFR: 107

## 2022-11-09 NOTE — Progress Notes (Signed)
Office Visit Note  Patient: Frances Watkins             Date of Birth: 1965/10/31           MRN: [id number]             PCP: Karilyn Cota, NP Referring: Karilyn Cota, NP Visit Date: 11/22/2022 Occupation: @GUAROCC @  Subjective:  Pain in both hands   History of Present Illness: Frances Watkins is a 57 y.o. female with history of psoriatic arthritis.  Patient remains on Otezla 30 mg 1 tablet by mouth twice daily, Methotrexate 0.7 ml sq injections once weekly, and folic acid 2 mg po daily.  Patient reports that she has about 2 weeks left of her prescription for Otezla.  She is aware that she we will no longer be able to receive maintenance samples of Otezla going forward.  She has been experiencing increased pain and stiffness involving both hands over the past few weeks.  She has also had ongoing discomfort due to Achilles tendinitis involving both feet, left greater than right.  She denies any increased SI joint pain at this time.  She has noted some itching on her scalp concerning for psoriasis.  She is open to discussing other treatment options today.   Activities of Daily Living:  Patient reports morning stiffness for 1 hour.   Patient Reports nocturnal pain.  Difficulty dressing/grooming: Denies Difficulty climbing stairs: Denies Difficulty getting out of chair: Denies Difficulty using hands for taps, buttons, cutlery, and/or writing: Denies  Review of Systems  Constitutional:  Positive for fatigue.  HENT:  Negative for mouth sores and mouth dryness.   Eyes:  Negative for dryness.  Respiratory:  Negative for cough, shortness of breath and wheezing.   Cardiovascular:  Positive for palpitations. Negative for chest pain.  Gastrointestinal:  Negative for blood in stool, constipation and diarrhea.  Endocrine: Negative for increased urination.  Genitourinary:  Negative for involuntary urination.  Musculoskeletal:  Positive for joint pain, joint pain, joint swelling and morning  stiffness. Negative for gait problem, myalgias, muscle weakness, muscle tenderness and myalgias.  Skin:  Negative for color change, rash, hair loss and sensitivity to sunlight.  Allergic/Immunologic: Negative for susceptible to infections.  Neurological:  Negative for dizziness and headaches.  Hematological:  Negative for swollen glands.  Psychiatric/Behavioral:  Negative for depressed mood and sleep disturbance. The patient is nervous/anxious.     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   Hypertension 05/29/2020   History of stomach ulcers    PONV (postoperative nausea and vomiting)    Tachycardia    Palpitations 04/09/2020   Primary hypertension 04/09/2020   GERD (gastroesophageal reflux disease)    Heart murmur    Psoriasis 09/21/2018   Anxiety 09/21/2018   Essential hypertension 09/21/2018   History of kidney stones 09/21/2018   Eustachian tube dysfunction 10/05/2011   History of cholesteatoma 10/05/2011    Past Medical History:  Diagnosis Date   Anxiety 09/21/2018   Essential hypertension 09/21/2018   Eustachian tube dysfunction 10/05/2011   GERD (gastroesophageal reflux disease)    Heart murmur    no treatment   History of cholesteatoma 10/05/2011   History of kidney stones    History of stomach  ulcers    PAF (paroxysmal atrial fibrillation) (HCC) 04/09/2020   Palpitations 04/09/2020   PONV (postoperative nausea and vomiting)    no problem  03/14/12   Primary hypertension 04/09/2020   Psoriasis    Tachycardia     Family History  Problem Relation Age of Onset   Arthritis Mother    Diabetes Father    Hypertension Father    High Cholesterol Father    Heart disease Father    Heart attack Father    Gout Father     Cirrhosis Father    Healthy Sister    Psoriasis Daughter        psoriatic arthritis   Healthy Daughter    Lymphoma Maternal Grandmother    Cancer Paternal Grandfather    Past Surgical History:  Procedure Laterality Date   COLONOSCOPY     2014, 2019   HOLMIUM LASER APPLICATION Right 03/23/2012   Procedure: HOLMIUM LASER APPLICATION;  Surgeon: Anner Crete, MD;  Location: [facility];  Service: Urology;  Laterality: Right;   INNER EAR SURGERY  2000   LITHOTRIPSY     x2   TONSILLECTOMY     age 41 yrs.   TYMPANOPLASTY W/ MASTOIDECTOMY     x2   UPPER GI ENDOSCOPY     2014, 2019   URETEROSCOPY  2014   Social History   Social History Narrative   Not on file   Immunization History  Administered Date(s) Administered   PFIZER(Purple Top)SARS-COV-2 Vaccination 04/05/2019, 04/24/2019, 10/05/2019   Pneumococcal Conjugate-13 09/27/2018     Objective: Vital Signs: BP (!) 160/80 (BP Location: Left Arm, Patient Position: Sitting, Cuff Size: Normal)   Pulse (!) 52   Resp 16   Ht 5\' 3"  (1.6 m)   Wt 184 lb 6.4 oz (83.6 kg)   LMP 03/02/2012   BMI 32.66 kg/m    Physical Exam Vitals and nursing note reviewed.  Constitutional:      Appearance: She is well-developed.  HENT:     Head: Normocephalic and atraumatic.  Eyes:     Conjunctiva/sclera: Conjunctivae normal.  Cardiovascular:     Rate and Rhythm: Normal rate and regular rhythm.     Heart sounds: Normal heart sounds.  Pulmonary:     Effort: Pulmonary effort is normal.     Breath sounds: Normal breath sounds.  Abdominal:     General: Bowel sounds are normal.     Palpations: Abdomen is soft.  Musculoskeletal:     Cervical back: Normal range of motion.  Lymphadenopathy:     Cervical: No cervical adenopathy.  Skin:    General: Skin is warm and dry.     Capillary Refill: Capillary refill takes less than 2 seconds.  Neurological:     Mental Status: She is alert and oriented to person, place, and time.   Psychiatric:        Behavior: Behavior normal.      Musculoskeletal Exam: C-spine, thoracic spine, lumbar spine and good range of motion.  Mild SI joint tenderness.  Shoulder joints, elbow joints, wrist joints, MCPs, PIPs, DIPs have good range of motion with no synovitis.  Complete fist formation bilaterally.  Hip joints have good range of motion with no groin pain.  Knee joints have good range of motion with no warmth or effusion. Ankle joint have good range of motion with no tenderness or joint swelling. Achilles tendonitis bilaterally.    CDAI Exam: CDAI Score: -- Patient Global: --; Provider Global: -- Swollen: --;  Tender: -- Joint Exam 11/22/2022   No joint exam has been documented for this visit   There is currently no information documented on the homunculus. Go to the Rheumatology activity and complete the homunculus joint exam.  Investigation: No additional findings.  Imaging: No results found.  Recent Labs: Lab Results  Component Value Date   WBC 5.6 05/04/2019   HGB 13.1 05/04/2019   PLT 289 05/04/2019   NA 141 06/19/2019   K 4.1 06/19/2019   CL 106 06/19/2019   CO2 30 06/19/2019   GLUCOSE 93 06/19/2019   BUN 13 06/19/2019   CREATININE 0.72 06/19/2019   BILITOT 0.5 06/19/2019   ALKPHOS 73 01/30/2019   AST 17 06/19/2019   ALT 14 06/19/2019   PROT 6.6 06/19/2019   ALBUMIN 4.5 01/30/2019   CALCIUM 8.9 06/19/2019   GFRAA 112 06/19/2019    Speciality Comments: No specialty comments available.  Procedures:  No procedures performed Allergies: Sulfa drugs cross reactors     Assessment / Plan:     Visit Diagnoses: Psoriatic arthritis (HCC) -No synovitis or dactylitis noted on examination today.  She has been experiencing increased pain in both hands for the past several weeks.  She also has ongoing discomfort in both Achilles tendons as well as bilateral SI joints.  Patient is currently on methotrexate 0.7 mL sq injections once weekly and is taking Otezla 30  mg 1 tablet twice daily.  Patient does not qualify for patient assistance for [facility] and will no longer be able to receive samples from [employer].  Different treatment options were discussed today.  Indications, contraindications, potential side effects of Skyrizi were discussed in detail.  All questions were addressed and consent was obtained.  Plan to apply for Skyrizi once approved she will return to the office for administration of the first injection.  She will remain on methotrexate and folic acid as combination therapy.  She was advised to notify us if she cannot tolerate taking Skyrizi or if she develops signs or symptoms of a flare.  She will follow-up in the office in 8 weeks or sooner if needed.  Plan: QuantiFERON-TB Gold Plus  Psoriasis -No active psoriasis at this time.  She has had some itching on her scalp which is concerning for a recurrence of psoriasis with weather change.  Plan: QuantiFERON-TB Gold Plus  Counseled patient that Cristy Folks is a IL-23 inhibitor.  Counseled patient on purpose, proper use, and adverse effects of Skyrizi.  Reviewed the most common adverse effects including infection, URTIs, headache, fatigue, tinea infections, and injection site. Counseled patient that Cristy Folks should be held for infection and prior to scheduled surgery.  Recommend annual influenza, PCV 15 or PCV20 or Pneumovax 23, and Shingrix as indicated. Advised that live vaccines should be avoided while taking Skyrizi.  Reviewed the importance of regular labs while on Skyrizi therapy.  Will monitor CBC and CMP 1 month after starting and then every 3 months routinely thereafter. Will monitor TB gold annually. Standing orders placed.  Provided patient with medication education material and answered all questions.  Patient voiced understanding.  Patient consented to [place].  Will upload consent into the media tab.  Reviewed storage instructions of Skyrizi.  Advised initial injection must be administered in office.   Patient verbalized understanding.  Will apply for Skyrizi through [employer] and update when we receive a response.  Dose will be Skyrizi 150 mg subcuteanously at week 0, week 4 then every 12 weeks thereafter.  Prescription pending lab results  and insurance approval.  High risk medication use - Planning to apply for Skyrizi through her insurance.  She will remain on Methotrexate 0.7 ml sq injections once weekly and folic acid 2 mg po daily.  Previous therapy: Otezla-does not qualify for patient assistance/no longer can receive samples.  CBC and CMP updated recently at her PCPs office--we will call to obtain these records.  HIV negative on 09/21/18. Immunoglobulins WNL on 09/21/18.  SPEP normal 09/21/18. Hepatitis panel negative on 07/17/18.  Future order for TB Gold placed today to be drawn prior to initiating Skyrizi. Discussed the importance of holding methotrexate and skyrizi if she develops signs or symptoms of an infection and to resume once the infection has completely cleared.  - Plan: QuantiFERON-TB Gold Plus  Screening for tuberculosis -Future order for TB gold placed today.  Plan: QuantiFERON-TB Gold Plus  Pain in thoracic spine: No midline thoracic pain.  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   Plantar fasciitis, bilateral: Not currently symptomatic.   Achilles tendonitis, bilateral: She has ongoing soreness in both feet due to achilles tendinitis bilaterally, left > right.  Plan to apply for skyrizi.   Chronic SI joint pain: She has intermittent tenderness over both SI joints. Plan to apply for skyrizi.   Other medical conditions are listed as follows:   Essential hypertension: Blood pressure was elevated today in the office and was rechecked prior to leaving.  Patient was advised to monitor blood pressure closely and to reach out to her PCP if it remains elevated.   History of kidney stones  Solar elastosis  Anxiety  PVC's (premature ventricular  contractions)  PAF (paroxysmal atrial fibrillation) (HCC)    Orders: Orders Placed This Encounter  Procedures   QuantiFERON-TB Gold Plus   No orders of the defined types were placed in this encounter.    Follow-Up Instructions: Return in about 8 weeks (around 01/17/2023) for Psoriatic arthritis.   Gearldine Bienenstock, PA-C  Note - This record has been created using Dragon software.  Chart creation errors have been sought, but may not always  have been located. Such creation errors do not reflect on  the standard of medical care.

## 2022-11-22 ENCOUNTER — Telehealth: Payer: Self-pay | Admitting: Pharmacist

## 2022-11-22 ENCOUNTER — Other Ambulatory Visit: Payer: Self-pay | Admitting: Rheumatology

## 2022-11-22 ENCOUNTER — Ambulatory Visit: Payer: Self-pay | Attending: Physician Assistant | Admitting: Physician Assistant

## 2022-11-22 ENCOUNTER — Encounter: Payer: Self-pay | Admitting: Physician Assistant

## 2022-11-22 VITALS — BP 160/80 | HR 52 | Resp 16 | Ht 63.0 in | Wt 184.4 lb

## 2022-11-22 DIAGNOSIS — I10 Essential (primary) hypertension: Secondary | ICD-10-CM

## 2022-11-22 DIAGNOSIS — M7661 Achilles tendinitis, right leg: Secondary | ICD-10-CM

## 2022-11-22 DIAGNOSIS — L578 Other skin changes due to chronic exposure to nonionizing radiation: Secondary | ICD-10-CM

## 2022-11-22 DIAGNOSIS — M533 Sacrococcygeal disorders, not elsewhere classified: Secondary | ICD-10-CM

## 2022-11-22 DIAGNOSIS — Z79899 Other long term (current) drug therapy: Secondary | ICD-10-CM

## 2022-11-22 DIAGNOSIS — I493 Ventricular premature depolarization: Secondary | ICD-10-CM

## 2022-11-22 DIAGNOSIS — M722 Plantar fascial fibromatosis: Secondary | ICD-10-CM

## 2022-11-22 DIAGNOSIS — L409 Psoriasis, unspecified: Secondary | ICD-10-CM

## 2022-11-22 DIAGNOSIS — M7662 Achilles tendinitis, left leg: Secondary | ICD-10-CM

## 2022-11-22 DIAGNOSIS — G8929 Other chronic pain: Secondary | ICD-10-CM

## 2022-11-22 DIAGNOSIS — L4050 Arthropathic psoriasis, unspecified: Secondary | ICD-10-CM

## 2022-11-22 DIAGNOSIS — Q667 Congenital pes cavus, unspecified foot: Secondary | ICD-10-CM

## 2022-11-22 DIAGNOSIS — Z87442 Personal history of urinary calculi: Secondary | ICD-10-CM

## 2022-11-22 DIAGNOSIS — I480 Paroxysmal atrial fibrillation: Secondary | ICD-10-CM

## 2022-11-22 DIAGNOSIS — F419 Anxiety disorder, unspecified: Secondary | ICD-10-CM

## 2022-11-22 DIAGNOSIS — Z111 Encounter for screening for respiratory tuberculosis: Secondary | ICD-10-CM

## 2022-11-22 DIAGNOSIS — M546 Pain in thoracic spine: Secondary | ICD-10-CM

## 2022-11-22 NOTE — Patient Instructions (Addendum)
Please have TB gold drawn by PCP. Will need this before we can start Skyrizi  Risankizumab Injection What is this medication? RISANKIZUMAB (RIS an KIZ ue mab) treats autoimmune conditions, such as psoriasis, arthritis, and Crohn's disease. It works by slowing down an overactive immune system. It is a monoclonal antibody. This medicine may be used for other purposes; ask your health care provider or pharmacist if you have questions. COMMON BRAND NAME(S): Skyrizi What should I tell my care team before I take this medication? They need to know if you have any of these conditions: Hepatic disease Immune system problems Infection, such as tuberculosis (TB), bacterial, fungal or viral infections Recent or upcoming vaccine An unusual or allergic reaction to risankizumab, other medications, foods, dyes, or preservatives Pregnant or trying to get pregnant Breast-feeding How should I use this medication? This medication is injected into a vein or under the skin. It is given by your care team in a hospital or clinic setting. It may also be given at home. If you get this medication at home, you will be taught how to prepare and give it. Use exactly as directed. Take it as directed on the prescription label. Keep taking it unless your care team tells you to stop. If you use a pen, be sure to take off the outer needle cover before using the dose. It is important that you put your used needles and syringes in a special sharps container. Do not put them in a trash can. If you do not have a sharps container, call your pharmacist or care team to get one. A special MedGuide will be given to you by the pharmacist with each prescription and refill. Be sure to read this information carefully each time. This medication comes with INSTRUCTIONS FOR USE. Ask your pharmacist for directions on how to use this medication. Read the information carefully. Talk to your pharmacist or care team if you have questions. Talk to  your care team about the use of this medication in children. Special care may be needed. Overdosage: If you think you have taken too much of this medicine contact a poison control center or emergency room at once. NOTE: This medicine is only for you. Do not share this medicine with others. What if I miss a dose? It is important not to miss any doses. Talk to your care team about what to do if you miss a dose. What may interact with this medication? Do not take this medication with any of the following: Live vaccines This list may not describe all possible interactions. Give your health care provider a list of all the medicines, herbs, non-prescription drugs, or dietary supplements you use. Also tell them if you smoke, drink alcohol, or use illegal drugs. Some items may interact with your medicine. What should I watch for while using this medication? Visit your care team for regular checks on your progress. Tell your care team if your symptoms do not start to get better or if they get worse. You will be tested for tuberculosis (TB) before you start this medication. If your care team prescribes any medication for TB, you should start taking the TB medication before starting this medication. Make sure to finish the full course of TB medication. This medication may increase your risk of getting an infection. Call your care team for advice if you get a fever, chills, sore throat, or other symptoms of a cold or flu. Do not treat yourself. Try to avoid being around people who  are sick. This medication can decrease the response to a vaccine. If you need to get vaccinated, tell your care team if you have received this medication. Extra booster doses may be needed. Talk to your care team to see if a different vaccination schedule is needed. What side effects may I notice from receiving this medication? Side effects that you should report to your care team as soon as possible: Allergic reactions--skin rash,  itching, hives, swelling of the face, lips, tongue, or throat Infection--fever, chills, cough, sore throat, wounds that don't heal, pain or trouble when passing urine, general feeling of discomfort or being unwell Liver injury--right upper belly pain, loss of appetite, nausea, light-colored stool, dark yellow or brown urine, yellowing skin or eyes, unusual weakness or fatigue Side effects that usually do not require medical attention (report to your care team if they continue or are bothersome): Fatigue Headache Pain, redness, or irritation at injection site Runny or stuffy nose Sore throat This list may not describe all possible side effects. Call your doctor for medical advice about side effects. You may report side effects to FDA at 1-800-FDA-1088. Where should I keep my medication? Keep out of the reach of children and pets. Store in a refrigerator. Do not freeze. Protect from light. Keep it in the original carton until you are ready to take it. See product for storage information. Each product may have different instructions. Remove the dose from the carton about 30 to 45 minutes before it is time for you to take it. Get rid of any unused medication after the expiration date. To get rid of medications that are no longer needed or have expired: Take the medication to a medication take-back program. Check with your pharmacy or law enforcement to find a location. If you cannot return the medication, ask your pharmacist or care team how to get rid of this medication safely. NOTE: This sheet is a summary. It may not cover all possible information. If you have questions about this medicine, talk to your doctor, pharmacist, or health care provider.  2024 Elsevier/Gold Standard (2021-04-08 00:00:00)

## 2022-11-22 NOTE — Progress Notes (Signed)
Pharmacy Note  Subjective: Patient presents today to [facility] for follow up office visit.  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  Prior therapy includes:Otezla (receiving through samples due to income exceeding PAP eligibility). Otezla bridge packs are no longer able to be supplied therefore patient switching treatment  She takes MTX 17.5mg  SQ once weekly.   Objective:  CBC    Component Value Date/Time   WBC 5.6 05/04/2019 0855   WBC 6.2 09/21/2018 1143   RBC 4.21 05/04/2019 0855   RBC 4.27 09/21/2018 1143   HGB 13.1 05/04/2019 0855   HCT 38.4 05/04/2019 0855   PLT 289 05/04/2019 0855   MCV 91 05/04/2019 0855   MCH 31.1 05/04/2019 0855   MCH 30.9 09/21/2018 1143   MCHC 34.1 05/04/2019 0855   MCHC 34.6 09/21/2018 1143   RDW 13.9 05/04/2019 0855   LYMPHSABS 1.2 05/04/2019 0855   EOSABS 0.2 05/04/2019 0855   BASOSABS 0.1 05/04/2019 0855     CMP     Component Value Date/Time   NA 141 06/19/2019 1217   NA 141 01/30/2019 0816   K 4.1 06/19/2019 1217   CL 106 06/19/2019 1217   CO2 30 06/19/2019 1217   GLUCOSE 93 06/19/2019 1217   BUN 13 06/19/2019 1217   BUN 13 01/30/2019 0816   CREATININE 0.72 06/19/2019 1217   CALCIUM 8.9 06/19/2019 1217   PROT 6.6 06/19/2019 1217   PROT 6.9 01/30/2019 0816   ALBUMIN 4.5 01/30/2019 0816   AST 17 06/19/2019 1217   ALT 14 06/19/2019 1217   ALKPHOS 73 01/30/2019 0816   BILITOT 0.5 06/19/2019 1217   BILITOT 0.5 01/30/2019 0816   GFRNONAA 96 06/19/2019 1217   GFRAA 112 06/19/2019 1217     Baseline Immunosuppressant Therapy Labs TB GOLD  Hepatitis Panel (07/17/2018 - scanned in) Hepatitis A Ab IgM - negative HbsAG - negative Hepatitis B core Ab, IgM - negative Hepatitis C Ab - negative  HIV Lab Results  Component Value Date   HIV NON-REACTIVE 09/21/2018   Immunoglobulins    Latest Ref Rng & Units 09/21/2018   11:43 AM  Immunoglobulin  Electrophoresis  IgA  47 - 310 mg/dL 366   IgG 440 - 3,474 mg/dL 2,595   IgM 50 - 638 mg/dL 756    SPEP    Latest Ref Rng & Units 06/19/2019   12:17 PM  Serum Protein Electrophoresis  Total Protein 6.1 - 8.1 g/dL 6.6    Chest x-ray: 4/33/2951 - No active cardiopulmonary disease.  Assessment/Plan:  Counseled patient that Cristy Folks is a IL-23 inhibitor.  Counseled patient on purpose, proper use, and adverse effects of Skyrizi.  Reviewed the most common adverse effects including infection, URTIs, headache, fatigue, tinea infections, and injection site. Counseled patient that Cristy Folks should be held for infection and prior to scheduled surgery.  Recommend annual influenza, PCV 15 or PCV20 or Pneumovax 23, and Shingrix as indicated. Advised that live vaccines should be avoided while taking Skyrizi.  Reviewed the importance of regular labs while on Skyrizi therapy.  Will monitor CBC and CMP 1 month after starting and then every 3 months routinely thereafter. Will monitor TB gold annually. Standing orders placed.  Provided patient with medication education material and answered all questions.  Patient voiced understanding.  Patient consented to [place].  Will upload consent into the media tab.  Reviewed storage instructions of Skyrizi.  Advised initial injection must be administered in office.  Patient verbalized understanding.  Will apply for Skyrizi through [employer] and update when we receive a response.  Dose will be Skyrizi 150 mg subcuteanously at week 0, week 4 then every 12 weeks thereafter.  Prescription pending lab results and insurance approval.   TB gold order rx handed to patient today for her to have drawn with PCP  Chesley Mires, PharmD, MPH, BCPS, CPP Clinical Pharmacist (Rheumatology and Pulmonology)

## 2022-11-22 NOTE — Telephone Encounter (Addendum)
Pending TB gold, patient will be Skyrizi SQ new start. Rx for lab order handed to patient today for her to have drawn with PCP  She does not qualify for [employer] PAP based on our conversation today regarding income limit.   Chesley Mires, PharmD, MPH, BCPS, CPP Clinical Pharmacist (Rheumatology and Pulmonology)

## 2022-11-24 ENCOUNTER — Telehealth: Payer: Self-pay | Admitting: *Deleted

## 2022-11-24 NOTE — Telephone Encounter (Signed)
Labs received from:[facility]  Drawn on: 11/01/2022  Reviewed by: Sherron Ales, PA-C  Labs drawn: CBC, CMP, Lipid Panel   Results: WNL

## 2022-11-25 LAB — QUANTIFERON-TB GOLD PLUS
QuantiFERON Mitogen Value: 6.06 [IU]/mL
QuantiFERON Nil Value: 0 [IU]/mL
QuantiFERON TB1 Ag Value: 0 [IU]/mL
QuantiFERON TB2 Ag Value: 0 [IU]/mL
QuantiFERON-TB Gold Plus: NEGATIVE

## 2022-11-25 NOTE — Progress Notes (Signed)
TB Gold is negative.

## 2022-11-29 NOTE — Telephone Encounter (Signed)
Patient returned call. Scheduled for Skyrizi new start tomorrow, 11/30/22. Will plan to use sample. Patient denies any ongoing infection or antibiotic use.  Chesley Mires, PharmD, MPH, BCPS, CPP Clinical Pharmacist (Rheumatology and Pulmonology)

## 2022-11-29 NOTE — Progress Notes (Unsigned)
Pharmacy Note  Subjective:   Patient presents to clinic today to receive first dose of Skyrizi for psoriatic arthritis. Patient currently takes Methotrexate 0.7 ml sq injections once weekly, and folic acid 2 mg po daily.   She recently stopped Otezla 30 mg BID due to not qualifying for PAP.   Patient running a fever or have signs/symptoms of infection? No  Patient currently on antibiotics for the treatment of infection? No  Patient have any upcoming invasive procedures/surgeries? No  Objective: CMP     Component Value Date/Time   NA 141 06/19/2019 1217   NA 141 01/30/2019 0816   K 4.1 06/19/2019 1217   CL 106 06/19/2019 1217   CO2 30 06/19/2019 1217   GLUCOSE 93 06/19/2019 1217   BUN 13 06/19/2019 1217   BUN 13 01/30/2019 0816   CREATININE 0.72 06/19/2019 1217   CALCIUM 8.9 06/19/2019 1217   PROT 6.6 06/19/2019 1217   PROT 6.9 01/30/2019 0816   ALBUMIN 4.5 01/30/2019 0816   AST 17 06/19/2019 1217   ALT 14 06/19/2019 1217   ALKPHOS 73 01/30/2019 0816   BILITOT 0.5 06/19/2019 1217   BILITOT 0.5 01/30/2019 0816   GFRNONAA 96 06/19/2019 1217   GFRAA 112 06/19/2019 1217    CBC    Component Value Date/Time   WBC 5.6 05/04/2019 0855   WBC 6.2 09/21/2018 1143   RBC 4.21 05/04/2019 0855   RBC 4.27 09/21/2018 1143   HGB 13.1 05/04/2019 0855   HCT 38.4 05/04/2019 0855   PLT 289 05/04/2019 0855   MCV 91 05/04/2019 0855   MCH 31.1 05/04/2019 0855   MCH 30.9 09/21/2018 1143   MCHC 34.1 05/04/2019 0855   MCHC 34.6 09/21/2018 1143   RDW 13.9 05/04/2019 0855   LYMPHSABS 1.2 05/04/2019 0855   EOSABS 0.2 05/04/2019 0855   BASOSABS 0.1 05/04/2019 0855    Baseline Immunosuppressant Therapy Labs TB GOLD    Latest Ref Rng & Units 11/22/2022    3:01 PM  Quantiferon TB Gold  Quantiferon TB Gold Plus Negative Negative    Hepatitis Panel   HIV Lab Results  Component Value Date   HIV NON-REACTIVE 09/21/2018   Immunoglobulins    Latest Ref Rng & Units 09/21/2018    11:43 AM  Immunoglobulin Electrophoresis  IgA  47 - 310 mg/dL 324   IgG 401 - 0,272 mg/dL 5,366   IgM 50 - 440 mg/dL 347    SPEP    Latest Ref Rng & Units 06/19/2019   12:17 PM  Serum Protein Electrophoresis  Total Protein 6.1 - 8.1 g/dL 6.6    Q2VZ No results found for: "G6PDH" TPMT No results found for: "TPMT"   Chest x-ray Impression (09/21/2018): No active cardiopulmonary disease   Assessment/Plan:  Reviewed importance of holding Skyrizi with signs/symptoms of an infections, if antibiotics are prescribed to treat an active infection, and with invasive procedures.  Demonstrated proper injection technique with Skyrizi demo device  Patient able to demonstrate proper injection technique using the teach back method.  Patient self injected in the right upper thigh with:  Sample Medication: Skyrizi 150 mg  NDC: 5638-7564-33 Lot: [id number] Expiration: 12/02/2023  Patient tolerated well.  Observed for 30 mins in office for adverse reaction. Patient denies itchiness and irritation at injection., No swelling or redness noted., and Reviewed injection site reaction management with patient verbally and printed information for review in AVS  Patient is to return in 1 month for labs and 6-8 weeks for follow-up appointment.  Standing orders for CBC/CMP placed.  TB gold will be monitored yearly.   Her insurance does not cover specialty medications and she does not qualify for PAP due to household income.   Patient will continue Skyrizi subcutaneously after 4 weeks, then every 12 weeks thereafter in combination with methotrexate 0.7 ml sq injections once weekly, and folic acid 2 mg po daily.  All questions encouraged and answered.  Instructed patient to call with any further questions or concerns.  Sofie Rower, PharmD [facility] PGY-1   Chesley Mires, PharmD, MPH, BCPS, CPP Clinical Pharmacist (Rheumatology and Pulmonology)  11/29/2022 8:42 PM

## 2022-11-29 NOTE — Telephone Encounter (Signed)
ATC patient to schedule Skyrizi new start. Unable to reach. Left VM requesting return call  Knox Saliva, PharmD, MPH, BCPS, CPP Clinical Pharmacist (Rheumatology and Pulmonology)

## 2022-11-30 ENCOUNTER — Ambulatory Visit: Payer: Self-pay | Attending: Rheumatology | Admitting: Pharmacist

## 2022-11-30 DIAGNOSIS — Z79899 Other long term (current) drug therapy: Secondary | ICD-10-CM

## 2022-11-30 DIAGNOSIS — L4050 Arthropathic psoriasis, unspecified: Secondary | ICD-10-CM

## 2022-11-30 DIAGNOSIS — Z7189 Other specified counseling: Secondary | ICD-10-CM

## 2022-11-30 DIAGNOSIS — L409 Psoriasis, unspecified: Secondary | ICD-10-CM

## 2022-11-30 MED ORDER — SKYRIZI PEN 150 MG/ML ~~LOC~~ SOAJ
SUBCUTANEOUS | Status: DC
Start: 2022-11-30 — End: 2023-12-15

## 2022-11-30 NOTE — Patient Instructions (Signed)
Your next SKYRIZI SQ dose is due on 12/28/2022, 03/22/2023, and every 12 weeks thereafter  CONTINUE methotrexate 0.7 ml weekly with folic acid  HOLD SKYRIZI SQ if you have signs or symptoms of an infection. You can resume once you feel better or back to your baseline. HOLD SKYRIZI SQ if you start antibiotics to treat an infection. HOLD SKYRIZI SQ around the time of surgery/procedures. Your surgeon will be able to provide recommendations on when to hold BEFORE and when you are cleared to RESUME.  Labs are due in 1 month then every 3 months. Lab hours are from Monday to Thursday 8am-12:30pm and 1pm-5pm and Friday 8am-12pm. You do not need an appointment if you come for labs during these times. If you'd like to go to a [facility] or [facility] closer to home, please call our clinic 48 hours prior to lab date so we can release orders in a timely manner.  How to manage an injection site reaction: Remember the 5 C's: COUNTER - leave on the counter at least 30 minutes but up to overnight to bring medication to room temperature. This may help prevent stinging COLD - place something cold (like an ice gel pack or cold water bottle) on the injection site just before cleansing with alcohol. This may help reduce pain CLARITIN - use Claritin (generic name is loratadine) for the first two weeks of treatment or the day of, the day before, and the day after injecting. This will help to minimize injection site reactions CORTISONE CREAM - apply if injection site is irritated and itching CALL ME - if injection site reaction is bigger than the size of your fist, looks infected, blisters, or if you develop hives

## 2022-12-22 ENCOUNTER — Other Ambulatory Visit: Payer: Self-pay | Admitting: Physician Assistant

## 2022-12-22 DIAGNOSIS — L4050 Arthropathic psoriasis, unspecified: Secondary | ICD-10-CM

## 2022-12-22 NOTE — Telephone Encounter (Signed)
Last Fill: 05/28/2022  Labs: 11/01/2022 CBC/CMP WNL  Next Visit: 01/17/2023  Last Visit: 11/22/2022  DX: Psoriatic arthritis   Current Dose per office note 11/22/2022: Methotrexate 0.7 ml sq injections once weekly   Okay to refill Methotrexate?

## 2022-12-28 LAB — LAB REPORT - SCANNED: EGFR: 104

## 2022-12-29 ENCOUNTER — Telehealth: Payer: Self-pay | Admitting: *Deleted

## 2022-12-29 NOTE — Telephone Encounter (Signed)
Medication Samples have been provided to the patient.  Drug name: Skyrizi       Strength: 150 mg        Qty: 1  LOT: [id number]  Exp.Date: 11/2023  Dosing instructions: Inject 150 mg into the skin week 0, week 4 and every 12 weeks thereafter.

## 2022-12-29 NOTE — Telephone Encounter (Signed)
Labs received from:[facility]  Drawn on: 12/28/2022   Reviewed by: Sherron Ales, PA-C  Labs drawn:CBC/CMP  Results: WNL

## 2023-01-03 NOTE — Progress Notes (Signed)
Office Visit Note  Patient: Frances Watkins             Date of Birth: Aug 29, 1965           MRN: [id number]             PCP: Karilyn Cota, NP Referring: Karilyn Cota, NP Visit Date: 01/17/2023 Occupation: @GUAROCC @  Subjective:  Medication monitoring   History of Present Illness: Frances Watkins is a 57 y.o. female with history of psoriatic arthritis.  Patient remains on Skyrizi 150 mg sq injections every 12 weeks, Methotrexate 0.7 ml sq inj once weekly, folic acid 2 mg po daily.  Patient has had a total of 2 doses of Skyrizi--Skyrizi was initiated on 11/30/2022.  She is tolerating Skyrizi without any side effects or injection site reactions.  She has not had any recent or recurrent infections.  She denies any new medical conditions.  Patient states that she has started to notice a significant improvement in her symptoms since adding on Skyrizi.  She states that the discomfort she was experiencing in her Achilles tendon bilaterally has started to improve.  She is also had less severe pain in both SI joints.  She denies any joint swelling at this time.  She denies any psoriasis or nail changes currently.  Patient states that overall she noticed improvement even after her first injection of Skyrizi.   Activities of Daily Living:  Patient reports morning stiffness for 30 minutes.   Patient Reports nocturnal pain.  Difficulty dressing/grooming: Denies Difficulty climbing stairs: Denies Difficulty getting out of chair: Denies Difficulty using hands for taps, buttons, cutlery, and/or writing: Reports  Review of Systems  Constitutional:  Positive for fatigue.  HENT:  Negative for mouth sores and mouth dryness.   Eyes:  Negative for pain and dryness.  Respiratory:  Negative for cough, shortness of breath and wheezing.   Cardiovascular:  Positive for palpitations. Negative for chest pain.  Gastrointestinal:  Negative for blood in stool, constipation and diarrhea.  Endocrine: Negative  for increased urination.  Genitourinary:  Negative for involuntary urination.  Musculoskeletal:  Positive for joint pain, joint pain, joint swelling and morning stiffness. Negative for gait problem, myalgias, muscle weakness, muscle tenderness and myalgias.  Skin:  Positive for rash. Negative for color change, hair loss and sensitivity to sunlight.  Allergic/Immunologic: Negative for susceptible to infections.  Neurological:  Negative for dizziness and headaches.  Hematological:  Negative for swollen glands.  Psychiatric/Behavioral:  Negative for depressed mood and sleep disturbance. The patient is not nervous/anxious.     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   Hypertension 05/29/2020   History of stomach ulcers    PONV (postoperative nausea and vomiting)    Tachycardia    Palpitations 04/09/2020   Primary hypertension 04/09/2020   GERD (gastroesophageal reflux disease)    Heart murmur    Psoriasis 09/21/2018   Anxiety 09/21/2018   Essential hypertension 09/21/2018   History of kidney stones 09/21/2018   Eustachian tube dysfunction 10/05/2011   History of cholesteatoma 10/05/2011    Past Medical History:  Diagnosis Date   Anxiety 09/21/2018   Essential hypertension 09/21/2018   Eustachian tube dysfunction 10/05/2011   GERD (gastroesophageal reflux disease)  Heart murmur    no treatment   History of cholesteatoma 10/05/2011   History of kidney stones    History of stomach ulcers    PAF (paroxysmal atrial fibrillation) (HCC) 04/09/2020   Palpitations 04/09/2020   PONV (postoperative nausea and vomiting)    no problem  03/14/12   Primary hypertension 04/09/2020   Psoriasis    Tachycardia     Family History  Problem  Relation Age of Onset   Arthritis Mother    Diabetes Father    Hypertension Father    High Cholesterol Father    Heart disease Father    Heart attack Father    Gout Father    Cirrhosis Father    Healthy Sister    Psoriasis Daughter        psoriatic arthritis   Healthy Daughter    Lymphoma Maternal Grandmother    Cancer Paternal Grandfather    Past Surgical History:  Procedure Laterality Date   COLONOSCOPY     2014, 2019   HOLMIUM LASER APPLICATION Right 03/23/2012   Procedure: HOLMIUM LASER APPLICATION;  Surgeon: Anner Crete, MD;  Location: [facility];  Service: Urology;  Laterality: Right;   INNER EAR SURGERY  2000   LITHOTRIPSY     x2   TONSILLECTOMY     age 30 yrs.   TYMPANOPLASTY W/ MASTOIDECTOMY     x2   UPPER GI ENDOSCOPY     2014, 2019   URETEROSCOPY  2014   Social History   Social History Narrative   Not on file   Immunization History  Administered Date(s) Administered   PFIZER(Purple Top)SARS-COV-2 Vaccination 04/05/2019, 04/24/2019, 10/05/2019   Pneumococcal Conjugate-13 09/27/2018     Objective: Vital Signs: BP (!) 175/94 (BP Location: Left Arm, Patient Position: Sitting, Cuff Size: Normal)   Pulse (!) 50   Resp 16   Ht 5\' 4"  (1.626 m)   Wt 192 lb 3.2 oz (87.2 kg)   LMP 03/02/2012   BMI 32.99 kg/m    Physical Exam Vitals and nursing note reviewed.  Constitutional:      Appearance: She is well-developed.  HENT:     Head: Normocephalic and atraumatic.  Eyes:     Conjunctiva/sclera: Conjunctivae normal.  Cardiovascular:     Rate and Rhythm: Normal rate and regular rhythm.     Heart sounds: Normal heart sounds.  Pulmonary:     Effort: Pulmonary effort is normal.     Breath sounds: Normal breath sounds.  Abdominal:     General: Bowel sounds are normal.     Palpations: Abdomen is soft.  Musculoskeletal:     Cervical back: Normal range of motion.  Lymphadenopathy:     Cervical: No cervical adenopathy.  Skin:    General:  Skin is warm and dry.     Capillary Refill: Capillary refill takes less than 2 seconds.  Neurological:     Mental Status: She is alert and oriented to person, place, and time.  Psychiatric:        Behavior: Behavior normal.      Musculoskeletal Exam: C-spine, thoracic spine, lumbar spine and good range of motion.  Mild SI joint tenderness to palpation bilaterally.  Shoulder joints, elbow joints, wrist joints, MCPs, PIPs, DIPs have good range of motion with no synovitis.  Complete fist formation bilaterally.  Hip joints have good range of motion with no groin pain.  Knee joints have good range of motion no warmth or effusion.  Ankle joints have  good range of motion no tenderness or joint swelling.  Mild inflammation along the Achilles tendon bilaterally-improving.   CDAI Exam: CDAI Score: -- Patient Global: --; Provider Global: -- Swollen: --; Tender: -- Joint Exam 01/17/2023   No joint exam has been documented for this visit   There is currently no information documented on the homunculus. Go to the Rheumatology activity and complete the homunculus joint exam.  Investigation: No additional findings.  Imaging: No results found.  Recent Labs: Lab Results  Component Value Date   WBC 5.6 05/04/2019   HGB 13.1 05/04/2019   PLT 289 05/04/2019   NA 141 06/19/2019   K 4.1 06/19/2019   CL 106 06/19/2019   CO2 30 06/19/2019   GLUCOSE 93 06/19/2019   BUN 13 06/19/2019   CREATININE 0.72 06/19/2019   BILITOT 0.5 06/19/2019   ALKPHOS 73 01/30/2019   AST 17 06/19/2019   ALT 14 06/19/2019   PROT 6.6 06/19/2019   ALBUMIN 4.5 01/30/2019   CALCIUM 8.9 06/19/2019   GFRAA 112 06/19/2019   QFTBGOLDPLUS Negative 11/22/2022    Speciality Comments: Skyrizi started 11/30/22  Procedures:  No procedures performed Allergies: Sulfa drugs cross reactors   Assessment / Plan:     Visit Diagnoses: Psoriatic arthritis (HCC): She has no synovitis or dactylitis on examination today.  She has  noticed a significant improvement in her symptoms since initiating Skyrizi on 11/30/2022.  She has been tolerating Skyrizi without any side effects or injection site reactions.  She remains on Skyrizi 150 mg sq--total of 2 doses administered as well as methotrexate 0.7 mL sq injections once weekly.  Her SI joint pain has been less severe and she has had less inflammation involving the Achilles tendon and plantar fascia bilaterally.  She has no active psoriasis at this time and no nail changes noted.  She will remain on combination therapy as prescribed.  Patient will return in February for an Skyrizi sample if available.  Patient was advised to notify us if she develops signs or symptoms of a flare.  She will follow-up in the office in 5 months or sooner if needed.  Psoriasis: No active psoriasis at this time.  No nail changes.  High risk medication use - Skyrizi 150 mg sq injections every 12 weeks, Methotrexate 0.7 ml sq inj once weekly, and folic acid 2 mg po daily.  Previous therapy: Otezla-does not qualify for patient assistance.  Inadequate response to otezla.   Cristy Folks was initiated on 11/30/2022. CBC and CMP updated on 11/02/22.  Patient plans on having updated lab work at her PCPs office. TB gold negative on 11/22/22.  Future orders for CBC and CMP were placed today. No recent or recurrent infections.  Discussed the importance of holding skyrizi and methotrexate if she develops signs or symptoms of an infection and to resume once the infection has completely cleared.  - Plan: CBC with Differential/Platelet, COMPLETE METABOLIC PANEL WITH GFR  Pain in thoracic spine: No midline spinal tenderness.    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  The discomfort in her feet is improving.   Plantar fasciitis, bilateral: Improving.  Achilles tendonitis, bilateral: Improving.  Less inflammation and tenderness upon palpation today.  Patient remain on Skyrizi and methotrexate as  prescribed.  Chronic SI joint pain: The discomfort in her SI joints has been less severe since initiating Skyrizi.  She will remain on Skyrizi and methotrexate as combination therapy.  Other medical conditions are listed as follows:   Essential  hypertension: BP was elevated and was rechecked prior to leaving-patient was advised to monitor blood pressure closely and to reach out to PCP if BP remains elevated.   History of kidney stones  Solar elastosis  Anxiety  PVC's (premature ventricular contractions)  PAF (paroxysmal atrial fibrillation) (HCC)  Orders: Orders Placed This Encounter  Procedures   CBC with Differential/Platelet   COMPLETE METABOLIC PANEL WITH GFR   No orders of the defined types were placed in this encounter.  Follow-Up Instructions: Return in about 5 months (around 06/17/2023) for Psoriatic arthritis.   Gearldine Bienenstock, PA-C  Note - This record has been created using Dragon software.  Chart creation errors have been sought, but may not always  have been located. Such creation errors do not reflect on  the standard of medical care.

## 2023-01-10 ENCOUNTER — Ambulatory Visit: Payer: Self-pay | Admitting: Physician Assistant

## 2023-01-14 ENCOUNTER — Other Ambulatory Visit: Payer: Self-pay | Admitting: Cardiology

## 2023-01-17 ENCOUNTER — Ambulatory Visit: Payer: Self-pay | Attending: Physician Assistant | Admitting: Physician Assistant

## 2023-01-17 ENCOUNTER — Encounter: Payer: Self-pay | Admitting: Physician Assistant

## 2023-01-17 VITALS — BP 175/94 | HR 50 | Resp 16 | Ht 64.0 in | Wt 192.2 lb

## 2023-01-17 DIAGNOSIS — M7661 Achilles tendinitis, right leg: Secondary | ICD-10-CM

## 2023-01-17 DIAGNOSIS — M533 Sacrococcygeal disorders, not elsewhere classified: Secondary | ICD-10-CM

## 2023-01-17 DIAGNOSIS — G8929 Other chronic pain: Secondary | ICD-10-CM

## 2023-01-17 DIAGNOSIS — Z79899 Other long term (current) drug therapy: Secondary | ICD-10-CM

## 2023-01-17 DIAGNOSIS — I480 Paroxysmal atrial fibrillation: Secondary | ICD-10-CM

## 2023-01-17 DIAGNOSIS — L4050 Arthropathic psoriasis, unspecified: Secondary | ICD-10-CM

## 2023-01-17 DIAGNOSIS — F419 Anxiety disorder, unspecified: Secondary | ICD-10-CM

## 2023-01-17 DIAGNOSIS — M7662 Achilles tendinitis, left leg: Secondary | ICD-10-CM

## 2023-01-17 DIAGNOSIS — L578 Other skin changes due to chronic exposure to nonionizing radiation: Secondary | ICD-10-CM

## 2023-01-17 DIAGNOSIS — I493 Ventricular premature depolarization: Secondary | ICD-10-CM

## 2023-01-17 DIAGNOSIS — M546 Pain in thoracic spine: Secondary | ICD-10-CM

## 2023-01-17 DIAGNOSIS — Q667 Congenital pes cavus, unspecified foot: Secondary | ICD-10-CM

## 2023-01-17 DIAGNOSIS — M722 Plantar fascial fibromatosis: Secondary | ICD-10-CM

## 2023-01-17 DIAGNOSIS — L409 Psoriasis, unspecified: Secondary | ICD-10-CM

## 2023-01-17 DIAGNOSIS — I10 Essential (primary) hypertension: Secondary | ICD-10-CM

## 2023-01-17 DIAGNOSIS — Z87442 Personal history of urinary calculi: Secondary | ICD-10-CM

## 2023-02-03 ENCOUNTER — Other Ambulatory Visit: Payer: Self-pay | Admitting: Medical Genetics

## 2023-03-16 ENCOUNTER — Other Ambulatory Visit (HOSPITAL_COMMUNITY)
Admission: RE | Admit: 2023-03-16 | Discharge: 2023-03-16 | Disposition: A | Discharge status: Home / Self Care | Payer: Self-pay | Source: Ambulatory Visit | Attending: Medical Genetics | Admitting: Medical Genetics

## 2023-03-16 ENCOUNTER — Other Ambulatory Visit (HOSPITAL_COMMUNITY): Payer: Self-pay

## 2023-03-18 LAB — LAB REPORT - SCANNED
Calcium: 9.1
EGFR: 105

## 2023-03-22 ENCOUNTER — Telehealth: Payer: Self-pay

## 2023-03-22 NOTE — Telephone Encounter (Signed)
 I called patient, patient will come 03/22/2023 for sample, Addie has sample labeled for patient.

## 2023-03-22 NOTE — Telephone Encounter (Signed)
 Medication Samples have been provided to the patient.  Drug name: Skyrizi       Strength: 150 mg        Qty: 1  LOT: [id number]  Exp.Date: 03/2024  Dosing instructions: Inject 150 mg into the skin every 12 weeks.

## 2023-03-22 NOTE — Telephone Encounter (Signed)
 Sample reserved in the fridge for the patient.

## 2023-03-22 NOTE — Telephone Encounter (Signed)
 Contacted the patient and advised we have received her labs and she can stop by the office to pick up the [facility] sample. Patient verbalized understanding.

## 2023-03-22 NOTE — Telephone Encounter (Signed)
 Patient contacted the office and states she is on Skyrizi and normally picks up a sample from the office. Patient states she is due for a sample today or tomorrow and would like to pick up the sample today due to the incoming weather for tomorrow. Advised the patient she is due for labs. Patient states she got them done at her primary care yesterday. Patient states she will have her primary care fax over the labs. Patient would like a call back once the labs are received so that she can pick up a sample.

## 2023-03-23 ENCOUNTER — Telehealth: Payer: Self-pay | Admitting: *Deleted

## 2023-03-23 NOTE — Telephone Encounter (Signed)
 Labs received from:[facility]  Drawn on: 03/18/2023  Reviewed by: Sherron Ales, PA-C  Labs drawn: Lipid Panel, CBC, Sed rate, CMP  Results: Cholesterol 205   Non HDL 152   AST 50   Patient is on Skyrizi 150 mg SQ every 12 weeks.   Per [name], avoid Tylenol and Alcohol use.   Attempted to contact the patient and left message for patient to call the office.

## 2023-03-24 NOTE — Telephone Encounter (Signed)
 Attempted to contact the patient and left message for patient to call the office.

## 2023-03-25 NOTE — Telephone Encounter (Signed)
 Reached out to patient to advised AST 50. Patient advised to avoid Tylenol and alcohol use. Patient expressed understanding.

## 2023-03-26 LAB — GENECONNECT MOLECULAR SCREEN: Genetic Analysis Overall Interpretation: NEGATIVE

## 2023-06-09 NOTE — Progress Notes (Signed)
 Office Visit Note  Patient: Frances Watkins             Date of Birth: [birth year]           MRN: [id number]             PCP: Jenita Miyamoto, NP Referring: Jenita Miyamoto, NP Visit Date: 06/23/2023 Occupation: @GUAROCC @  Subjective:  Medication management  History of Present Illness: Frances Watkins is a 58 y.o. female with psoriatic arthritis and psoriasis.  Patient states that she has been taking Skyrizi  every [redacted] weeks along with methotrexate  0.7 mL subcu weekly and folic acid .  She has not had a flare of psoriasis, psoriatic arthritis.  She continues to have  stiffness in her joints which she describes in her thoracic spine, and SI joints.  She has intermittent discomfort in the Achilles tendon.  She has not noticed any swelling in the tendons or joints.  She wants to taper methotrexate  as her father has liver disease.    Activities of Daily Living:  Patient reports morning stiffness for 1 hour.   Patient Denies nocturnal pain.  Difficulty dressing/grooming: Denies Difficulty climbing stairs: Reports Difficulty getting out of chair: Denies Difficulty using hands for taps, buttons, cutlery, and/or writing: Denies  Review of Systems  Constitutional:  Positive for fatigue.  HENT:  Negative for mouth sores and mouth dryness.   Eyes:  Negative for dryness.  Respiratory:  Negative for shortness of breath.   Cardiovascular:  Positive for palpitations. Negative for chest pain.  Gastrointestinal:  Negative for blood in stool, constipation and diarrhea.  Endocrine: Negative for increased urination.  Genitourinary:  Negative for involuntary urination.  Musculoskeletal:  Positive for joint pain, joint pain, joint swelling, myalgias, morning stiffness and myalgias. Negative for gait problem, muscle weakness and muscle tenderness.  Skin:  Negative for color change, rash, hair loss and sensitivity to sunlight.  Allergic/Immunologic: Negative for susceptible to infections.  Neurological:   Negative for dizziness and headaches.  Hematological:  Negative for swollen glands.  Psychiatric/Behavioral:  Negative for depressed mood and sleep disturbance. The patient is nervous/anxious.     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   Hypertension 05/29/2020   History of stomach ulcers    PONV (postoperative nausea and vomiting)    Tachycardia    Palpitations 04/09/2020   Primary hypertension 04/09/2020   GERD (gastroesophageal reflux disease)    Heart murmur    Psoriasis 09/21/2018   Anxiety 09/21/2018   Essential hypertension 09/21/2018   History of kidney stones 09/21/2018   Eustachian tube dysfunction 10/05/2011   History of cholesteatoma 10/05/2011    Past Medical History:  Diagnosis Date   Anxiety 09/21/2018   Essential hypertension 09/21/2018   Eustachian tube dysfunction 10/05/2011   GERD (gastroesophageal reflux disease)    Heart murmur    no treatment   History of cholesteatoma 10/05/2011   History of kidney stones    History of stomach ulcers    PAF (paroxysmal atrial fibrillation) (HCC) 04/09/2020   Palpitations 04/09/2020   PONV (postoperative nausea and vomiting)    no problem  03/14/12   Primary hypertension 04/09/2020   Psoriasis    Tachycardia     Family History  Problem Relation  Age of Onset   Arthritis Mother    Diabetes Father    Hypertension Father    High Cholesterol Father    Heart disease Father    Heart attack Father    Gout Father    Cirrhosis Father    Healthy Sister    Psoriasis Daughter        psoriatic arthritis   Healthy Daughter    Lymphoma Maternal Grandmother    Cancer Paternal Grandfather    Past Surgical History:  Procedure Laterality Date   COLONOSCOPY     2014,  2019   HOLMIUM LASER APPLICATION Right 03/23/2012   Procedure: HOLMIUM LASER APPLICATION;  Surgeon: Willye Harvey, MD;  Location: [facility];  Service: Urology;  Laterality: Right;   INNER EAR SURGERY  2000   LITHOTRIPSY     x2   TONSILLECTOMY     age 42 yrs.   TYMPANOPLASTY W/ MASTOIDECTOMY     x2   UPPER GI ENDOSCOPY     2014, 2019   URETEROSCOPY  2014   Social History   Social History Narrative   Not on file   Immunization History  Administered Date(s) Administered   PFIZER(Purple Top)SARS-COV-2 Vaccination 04/05/2019, 04/24/2019, 10/05/2019   Pneumococcal Conjugate-13 09/27/2018     Objective: Vital Signs: BP (!) 175/47 (BP Location: Left Arm, Patient Position: Sitting)   Pulse (!) 47   Resp 14   Ht 5\' 3"  (1.6 m)   Wt 193 lb (87.5 kg)   LMP 03/02/2012   BMI 34.19 kg/m    Physical Exam Vitals and nursing note reviewed.  Constitutional:      Appearance: She is well-developed.  HENT:     Head: Normocephalic and atraumatic.  Eyes:     Conjunctiva/sclera: Conjunctivae normal.  Cardiovascular:     Rate and Rhythm: Normal rate and regular rhythm.     Heart sounds: Normal heart sounds.  Pulmonary:     Effort: Pulmonary effort is normal.     Breath sounds: Normal breath sounds.  Abdominal:     General: Bowel sounds are normal.     Palpations: Abdomen is soft.  Musculoskeletal:     Cervical back: Normal range of motion.  Lymphadenopathy:     Cervical: No cervical adenopathy.  Skin:    General: Skin is warm and dry.     Capillary Refill: Capillary refill takes less than 2 seconds.  Neurological:     Mental Status: She is alert and oriented to person, place, and time.  Psychiatric:        Behavior: Behavior normal.      Musculoskeletal Exam: Cervical, thoracic and lumbar spine were in good range of motion.  She had mild tenderness over bilateral SI joints.  Shoulders, elbows, wrist, MCPs PIPs and DIPs with good range of motion.  She had bilateral  DIP thickening with no synovitis.  Hip joints and knee joints with good range of motion without any warmth swelling or effusion.  There was no tenderness over ankles or MTPs.  There was no Achilles tendinitis or plantar fasciitis.  CDAI Exam: CDAI Score: -- Patient Global: --; Provider Global: -- Swollen: --; Tender: -- Joint Exam 06/23/2023   No joint exam has been documented for this visit   There is currently no information documented on the homunculus. Go to the Rheumatology activity and complete the homunculus joint exam.  Investigation: No additional findings.  Imaging: No results found.  Recent Labs: Lab Results  Component Value Date  WBC 5.6 05/04/2019   HGB 13.1 05/04/2019   PLT 289 05/04/2019   NA 141 06/19/2019   K 4.1 06/19/2019   CL 106 06/19/2019   CO2 30 06/19/2019   GLUCOSE 93 06/19/2019   BUN 13 06/19/2019   CREATININE 0.72 06/19/2019   BILITOT 0.5 06/19/2019   ALKPHOS 73 01/30/2019   AST 17 06/19/2019   ALT 14 06/19/2019   PROT 6.6 06/19/2019   ALBUMIN 4.5 01/30/2019   CALCIUM 9.1 03/17/2023   GFRAA 112 06/19/2019   QFTBGOLDPLUS Negative 11/22/2022     Speciality Comments: Skyrizi  started 11/30/22  Procedures:  No procedures performed Allergies: Sulfa drugs cross reactors   Assessment / Plan:     Visit Diagnoses: Psoriatic arthritis (HCC)-no synovitis was noted on the examination.  Patient gives history of mild intermittent plantar fasciitis and Achilles tendinitis.  She had no tenderness over the plantar fascia or Achilles tendon today.  She continues to be on Skyrizi  and methotrexate .  She wants to taper methotrexate .  She had no synovitis on the examination today.  I advised her to reduce methotrexate  to 0.5 mL subcu weekly for 3 months.  If she does not have any flares then she can reduce methotrexate  to 0.3 mL subcu weekly.  We may discuss coming off methotrexate  in the future.  She is apprehensive about methotrexate  use as her father has  liver disease.  Patient requested sample of Skyrizi  which was given.  Psoriasis-she had no active lesions today.  High risk medication use - Skyrizi  150 mg sq injections every 12 weeks, Methotrexate  0.7 ml sq inj once weekly, and folic acid  2 mg po daily. -I do not have any recent labs.  Patient states she has been getting labs through her PCPs office.  February 2025 CBC was normal, CMP showed AST 50.  The risk of hepatotoxicity and renal toxicity without getting labs monitored was discussed.  Advised her to get labs today and then every 3 months to monitor for drug toxicity.  Plan: CBC with Differential/Platelet, Comprehensive metabolic panel with GFR.  Information on immunization was placed in the AVS.  She was advised to hold methotrexate  and the Skyrizi  if she develops an infection resume after the infection resolves.  Pain in thoracic spine-she complains of off-and-on discomfort in her thoracic spine.  She had no point tenderness.  Pes cavus  Plantar fasciitis, bilateral-use of shoes with arch support was advised.  Achilles tendonitis, bilateral-no Achilles tendinitis was noted.  Patient reports intermittent discomfort in the Achilles tendon.  Chronic SI joint pain-she gives history of chronic SI joint discomfort.  Essential hypertension-blood pressure was elevated at 168/102.  Repeat blood pressure was 174/90.  She was advised to monitor blood pressure closely and follow-up with her PCP.  Other medical problems listed as follows:  History of kidney stones  Solar elastosis  Anxiety  PVC's (premature ventricular contractions)  PAF (paroxysmal atrial fibrillation) (HCC)  Orders: Orders Placed This Encounter  Procedures   CBC with Differential/Platelet   Comprehensive metabolic panel with GFR   No orders of the defined types were placed in this encounter.    Follow-Up Instructions: Return in about 5 months (around 11/23/2023) for Psoriatic arthritis.   Nicholas Bari,  MD  Note - This record has been created using Animal nutritionist.  Chart creation errors have been sought, but may not always  have been located. Such creation errors do not reflect on  the standard of medical care.

## 2023-06-23 ENCOUNTER — Ambulatory Visit: Payer: Self-pay | Attending: Rheumatology | Admitting: Rheumatology

## 2023-06-23 ENCOUNTER — Telehealth: Payer: Self-pay | Admitting: *Deleted

## 2023-06-23 ENCOUNTER — Encounter: Payer: Self-pay | Admitting: Rheumatology

## 2023-06-23 VITALS — BP 174/90 | HR 47 | Resp 14 | Ht 63.0 in | Wt 193.0 lb

## 2023-06-23 DIAGNOSIS — L409 Psoriasis, unspecified: Secondary | ICD-10-CM

## 2023-06-23 DIAGNOSIS — F419 Anxiety disorder, unspecified: Secondary | ICD-10-CM

## 2023-06-23 DIAGNOSIS — I480 Paroxysmal atrial fibrillation: Secondary | ICD-10-CM

## 2023-06-23 DIAGNOSIS — Q667 Congenital pes cavus, unspecified foot: Secondary | ICD-10-CM

## 2023-06-23 DIAGNOSIS — M7661 Achilles tendinitis, right leg: Secondary | ICD-10-CM

## 2023-06-23 DIAGNOSIS — M7662 Achilles tendinitis, left leg: Secondary | ICD-10-CM

## 2023-06-23 DIAGNOSIS — I10 Essential (primary) hypertension: Secondary | ICD-10-CM

## 2023-06-23 DIAGNOSIS — M533 Sacrococcygeal disorders, not elsewhere classified: Secondary | ICD-10-CM

## 2023-06-23 DIAGNOSIS — I493 Ventricular premature depolarization: Secondary | ICD-10-CM

## 2023-06-23 DIAGNOSIS — L578 Other skin changes due to chronic exposure to nonionizing radiation: Secondary | ICD-10-CM

## 2023-06-23 DIAGNOSIS — M722 Plantar fascial fibromatosis: Secondary | ICD-10-CM

## 2023-06-23 DIAGNOSIS — G8929 Other chronic pain: Secondary | ICD-10-CM

## 2023-06-23 DIAGNOSIS — Z87442 Personal history of urinary calculi: Secondary | ICD-10-CM

## 2023-06-23 DIAGNOSIS — L4050 Arthropathic psoriasis, unspecified: Secondary | ICD-10-CM

## 2023-06-23 DIAGNOSIS — M546 Pain in thoracic spine: Secondary | ICD-10-CM

## 2023-06-23 DIAGNOSIS — Z79899 Other long term (current) drug therapy: Secondary | ICD-10-CM

## 2023-06-23 NOTE — Patient Instructions (Signed)
 You may reduce methotrexate  to 0.5 mL subcu weekly for 3 months and if there are no flare of arthritis or psoriasis then you will reduce methotrexate  use to 0.3 mL subcu weekly  Standing Labs We placed an order today for your standing lab work.   Please have your standing labs drawn in August and every 3 months  Please have your labs drawn 2 weeks prior to your appointment so that the provider can discuss your lab results at your appointment, if possible.  Please note that you may see your imaging and lab results in MyChart before we have reviewed them. We will contact you once all results are reviewed. Please allow our office up to 72 hours to thoroughly review all of the results before contacting the office for clarification of your results.  WALK-IN LAB HOURS  Monday through Thursday from 8:00 am -12:30 pm and 1:00 pm-4:00 pm and Friday from 8:00 am-12:00 pm.  Patients with office visits requiring labs will be seen before walk-in labs.  You may encounter longer than normal wait times. Please allow additional time. Wait times may be shorter on  Monday and Thursday afternoons.  We do not book appointments for walk-in labs. We appreciate your patience and understanding with our staff.   Labs are drawn by [facility]. Please bring your co-pay at the time of your lab draw.  You may receive a bill from [facility] for your lab work.  Please note if you are on Hydroxychloroquine and and an order has been placed for a Hydroxychloroquine level,  you will need to have it drawn 4 hours or more after your last dose.  If you wish to have your labs drawn at another location, please call the office 24 hours in advance so we can fax the orders.  The office is located at [address], [address], [place], [place] [address]   If you have any questions regarding directions or hours of operation,  please call [phone].   As a reminder, please drink plenty of water  prior to coming for your lab work. Thanks!    Vaccines You are taking a medication(s) that can suppress your immune system.  The following immunizations are recommended: Flu annually Covid-19  Td/Tdap (tetanus, diphtheria, pertussis) every 10 years Pneumonia (Prevnar 15 then Pneumovax 23 at least 1 year apart.  Alternatively, can take Prevnar 20 without needing additional dose) Shingrix: 2 doses from 4 weeks to 6 months apart  Please check with your PCP to make sure you are up to date.   If you have signs or symptoms of an infection or start antibiotics: First, call your PCP for workup of your infection. Hold your medication through the infection, until you complete your antibiotics, and until symptoms resolve if you take the following: Injectable medication (Actemra, Benlysta, Cimzia, Cosentyx, Enbrel, Humira, Kevzara, Orencia, Remicade, Simponi, Stelara, Taltz, Tremfya) Methotrexate  Leflunomide (Arava) Mycophenolate (Cellcept) Xeljanz, Olumiant, or Rinvoq

## 2023-06-23 NOTE — Telephone Encounter (Signed)
 Medication Samples have been provided to the patient.  Drug name: Skyrizi  Strength: 150 mg Qty: 1 LOT: [id number] Exp.Date: April, 2026 Dosing instructions: Inject 150 mg into the skin every 12 weeks.

## 2023-08-08 ENCOUNTER — Ambulatory Visit: Payer: Self-pay

## 2023-08-08 VITALS — BP 138/88 | HR 46 | Ht 63.0 in | Wt 189.0 lb

## 2023-08-08 DIAGNOSIS — I480 Paroxysmal atrial fibrillation: Secondary | ICD-10-CM

## 2023-08-08 DIAGNOSIS — I10 Essential (primary) hypertension: Secondary | ICD-10-CM

## 2023-08-08 DIAGNOSIS — Z79899 Other long term (current) drug therapy: Secondary | ICD-10-CM

## 2023-08-08 DIAGNOSIS — I4719 Other supraventricular tachycardia: Secondary | ICD-10-CM

## 2023-08-08 DIAGNOSIS — R001 Bradycardia, unspecified: Secondary | ICD-10-CM | POA: Insufficient documentation

## 2023-08-08 DIAGNOSIS — Z5181 Encounter for therapeutic drug level monitoring: Secondary | ICD-10-CM

## 2023-08-08 MED ORDER — METOPROLOL SUCCINATE ER 25 MG PO TB24: 25.0000 mg | ORAL_TABLET | Freq: Every day | ORAL | 2 refills | Status: AC

## 2023-08-08 NOTE — Assessment & Plan Note (Signed)
 Short episodes of SVT on heart monitor March 2022. Symptoms along with A-fib relatively well-controlled on low-dose flecainide . See discussion under A-fib.

## 2023-08-08 NOTE — Progress Notes (Addendum)
 Cardiology Consultation:    Date:  08/08/2023   ID:  TAMBRA MULLER, DOB 1965/11/18, MRN [id number]  PCP:  Glenford Harlene SAILOR, NP  Cardiologist:  Alean SAUNDERS Nan Maya, MD   Referring MD: Glenford Harlene SAILOR, NP   Chief Complaint  Patient presents with   Annual Exam     ASSESSMENT AND PLAN:   [name] is a 58 year old woman history of paroxysmal atrial fibrillation [device identified on Apple watch later heart monitor noted runs of atrial tachycardia] CHADS2 VASC score 2 and with increasing frequency of symptoms was started on flecainide  for rhythm control, SVT [atrial tachycardia and AV nodal reentrant tachycardia on heart monitor March 2022 with the longest episode lasting 16.7 seconds], symptomatic PVCs, hypertension, GERD, psoriatic.   Here for routine follow Problem List Items Addressed This Visit     Essential hypertension   Blood pressure suboptimally controlled in the office. Advised her to monitor regularly at home. Continue current medications hydrochlorothiazide  12.5 mg once daily and olmesartan 40 mg once daily. Target blood pressure below 130/80 mmHg.       Relevant Medications   metoprolol  succinate (TOPROL -XL) 25 MG 24 hr tablet   aspirin EC 81 MG tablet   AVNRT (AV nodal re-entry tachycardia) (HCC)   Short episodes of SVT on heart monitor March 2022. Symptoms along with A-fib relatively well-controlled on low-dose flecainide . See discussion under A-fib.      Relevant Medications   metoprolol  succinate (TOPROL -XL) 25 MG 24 hr tablet   aspirin EC 81 MG tablet   Atrial fibrillation (HCC) - Primary   Device identified on Apple Watch at home.  Infrequent episodes but symptomatic. Heart monitor March 2022 captured SVT episodes but no A-fib.  Symptoms well-controlled on flecainide  50 mg twice daily and metoprolol  XL 37.5 mg once daily.  Heart rates today in the office in the lower end. Asymptomatic. Lower the dose of metoprolol  XL to 25 mg once  daily.  CHA2DS2-VASc score 2 [woman and hypertension]. Given the relatively burden of A-fib and low risk and financial constraints to try Xarelto or Eliquis and difficulty with warfarin use currently not on anticoagulation. Discussed other factors which can increase the risk for stroke, including burden of A-fib and recurrent paroxysmal episodes, diabetes, plaque buildup in the blood vessels, and age advancing to 59 years. I would recommend starting NOACs or warfarin if her financial situation changes or any additional risk factors and of or once she gets to the age of 67.  She acknowledges and will let us  know.  In the interim I recommended her to start taking aspirin 81 mg once daily.  From rhythm control standpoint flecainide  seems to have helped her symptoms. However I do not see any prior testing with treadmill EKG to assess heart rate response and effects of flecainide . Will schedule her for treadmill EKG stress test for chronotropic response, flecainide  monitoring.        Relevant Medications   metoprolol  succinate (TOPROL -XL) 25 MG 24 hr tablet   aspirin EC 81 MG tablet   Other Relevant Orders   EKG 12-Lead (Completed)   Exercise Tolerance Test   Cardiac Stress Test: Informed Consent Details: Physician/Practitioner Attestation; Transcribe to consent form and obtain patient signature   Encounter for monitoring flecainide  therapy   Relevant Orders   Exercise Tolerance Test   Cardiac Stress Test: Informed Consent Details: Physician/Practitioner Attestation; Transcribe to consent form and obtain patient signature   Bradycardia   Relevant Orders   Exercise Tolerance Test  Cardiac Stress Test: Informed Consent Details: Physician/Practitioner Attestation; Transcribe to consent form and obtain patient signature   Return to clinic tentatively in 1 year or as needed.  Addendum 10/15/2023. Labs from PCPs office dated 09/21/2023 reported total cholesterol 212, triglycerides 94, HDL  62, LDL 131. Hemoglobin 12.6, hematocrit 41, platelets 246. Sodium 145, potassium 3.9, BUN 13, creatinine 0.62, eGFR 104 Normal transaminases and alkaline phosphatase. Continue management as per PCP. If LDL does not improve with dietary and lifestyle modifications [target below 100 mg/dL], would consider statin therapy.    History of Present Illness:    Frances Watkins is a 58 y.o. female who is being seen today for follow-up visit. PCP Glenford Harlene SAILOR, NP. Last visit in our office was 08/11/2022 with Delon Hoover, NP-C. Had visit with electrophysiologist Dr. Inocencio prior to that for paroxysmal atrial fibrillation.  Here for the visit by herself.  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  Walks regularly for exercise and recently they brought puppy home 11-week-old and has been training.  Has history of paroxysmal atrial fibrillation [device identified on Apple watch later heart monitor noted runs of atrial tachycardia] CHADS2 VASC score 2 and with increasing frequency of symptoms was started on flecainide  for rhythm control, SVT [atrial tachycardia and AV nodal reentrant tachycardia on heart monitor March 2022 with the longest episode lasting 16.7 seconds], symptomatic PVCs, hypertension, GERD, psoriatic.  Overall has been doing well.  Denies any significant increase in palpitations.  Rare episodes of palpitations lasting for few seconds, last episode was 2 days ago.  Feels flecainide  has improved her overall symptoms.  Noticed increased episodes with higher caffeine intake which she consumes occasionally.  EKG in the clinic today shows sinus rhythm, bradycardia with heart rate 46/min, PR interval 144 ms, QRS duration 80 ms, QTc 395 ms.  In comparison prior EKG from August 11, 2022 noted sinus rhythm with heart rate 51/min.  Last blood work available to review is from 2 03/17/2023 total cholesterol 205, triglycerides 123, HDL 53, LDL 127. Hemoglobin 13.1 and hematocrit 39.4,  platelets 295. Sodium 143, potassium 4, BUN 10, creatinine 0.61 AST mildly elevated 50, ALT normal 41, alkaline phosphatase normal 68  Past Medical History:  Diagnosis Date   Anxiety 09/21/2018   Essential hypertension 09/21/2018   Eustachian tube dysfunction 10/05/2011   GERD (gastroesophageal reflux disease)    Heart murmur    no treatment   History of cholesteatoma 10/05/2011   History of kidney stones    History of stomach ulcers    PAF (paroxysmal atrial fibrillation) (HCC) 04/09/2020   Palpitations 04/09/2020   PONV (postoperative nausea and vomiting)    no problem  03/14/12   Primary hypertension 04/09/2020   Psoriasis    Tachycardia     Past Surgical History:  Procedure Laterality Date   COLONOSCOPY     2014, 2019   HOLMIUM LASER APPLICATION Right 03/23/2012   Procedure: HOLMIUM LASER APPLICATION;  Surgeon: Norleen JINNY Seltzer, MD;  Location: [facility];  Service: Urology;  Laterality: Right;   INNER EAR SURGERY  2000   LITHOTRIPSY     x2   TONSILLECTOMY     age 89 yrs.   TYMPANOPLASTY W/ MASTOIDECTOMY     x2   UPPER GI ENDOSCOPY     2014, 2019   URETEROSCOPY  2014    Current Medications: Current Meds  Medication Sig   aspirin EC 81 MG tablet Take 81 mg by mouth daily. Swallow whole.  flecainide  (TAMBOCOR ) 50 MG tablet TAKE 1 TABLET BY MOUTH 2 TIMES DAILY.   FLUoxetine (PROZAC) 20 MG capsule    hydrochlorothiazide  (MICROZIDE ) 12.5 MG capsule TAKE 1 CAPSULE BY MOUTH DAILY.   Multiple Vitamin (MULTIVITAMIN WITH MINERALS) TABS tablet Take 1 tablet by mouth daily. Unknown strength   olmesartan (BENICAR) 40 MG tablet Take 40 mg by mouth daily.   risankizumab -rzaa (SKYRIZI  PEN) 150 MG/ML pen Inject 150 mg into the skin at week 0, week 4, and every 12 weeks thereafter (patient received sample)   [DISCONTINUED] folic acid  (FOLVITE ) 1 MG tablet TAKE 2 TABLETS BY MOUTH DAILY   [DISCONTINUED] methotrexate  50 MG/2ML injection INJECT 0.7ML INTO THE SKIN ONCE WEEKLY.    [DISCONTINUED] metoprolol  succinate (TOPROL -XL) 25 MG 24 hr tablet TAKE 1 &1/2 TABLETS BY MOUTH DAILY.   [DISCONTINUED] TUBERCULIN SYR 1CC/27GX1/2 (B-D TB SYRINGE 1CC/27GX1/2) 27G X 1/2 1 ML MISC 12 Syringes by Does not apply route once a week.     Allergies:   Sulfa drugs cross reactors   Social History   Socioeconomic History   Marital status: Married    Spouse name: Not on file   Number of children: Not on file   Years of education: Not on file   Highest education level: Not on file  Occupational History   Not on file  Tobacco Use   Smoking status: Never    Passive exposure: Never   Smokeless tobacco: Never  Vaping Use   Vaping status: Never Used  Substance and Sexual Activity   Alcohol use: Yes    Comment: occasional   Drug use: No   Sexual activity: Not on file  Other Topics Concern   Not on file  Social History Narrative   Not on file   Social Drivers of Health   Financial Resource Strain: Not on file  Food Insecurity: Not on file  Transportation Needs: Not on file  Physical Activity: Not on file  Stress: Not on file  Social Connections: Not on file     Family History: The patient's family history includes Arthritis in her mother; Cancer in her paternal grandfather; Cirrhosis in her father; Diabetes in her father; Gout in her father; Healthy in her daughter and sister; Heart attack in her father; Heart disease in her father; High Cholesterol in her father; Hypertension in her father; Lymphoma in her maternal grandmother; Psoriasis in her daughter. ROS:   Please see the history of present illness.    All 14 point review of systems negative except as described per history of present illness.  EKGs/Labs/Other Studies Reviewed:    The following studies were reviewed today:   EKG:  EKG Interpretation Date/Time:  Monday August 08 2023 08:29:29 EDT Ventricular Rate:  46 PR Interval:  144 QRS Duration:  80 QT Interval:  452 QTC Calculation: 395 R  Axis:   64  Text Interpretation: Sinus bradycardia Otherwise normal ECG When compared with ECG of 11-Aug-2022 09:01, Previous ECG has undetermined rhythm, needs review Confirmed by Liborio Hai reddy [phone]) on 08/08/2023 8:42:59 AM    Recent Labs: No results found for requested labs within last 365 days.  Recent Lipid Panel No results found for: CHOL, TRIG, HDL, CHOLHDL, VLDL, LDLCALC, LDLDIRECT  Physical Exam:    VS:  BP (!) 160/100   Pulse (!) 46   Ht 5' 3 (1.6 m)   Wt 189 lb (85.7 kg)   LMP 03/02/2012   SpO2 95%   BMI 33.48 kg/m  Wt Readings from Last 3 Encounters:  08/08/23 189 lb (85.7 kg)  06/23/23 193 lb (87.5 kg)  01/17/23 192 lb 3.2 oz (87.2 kg)     GENERAL:  Well nourished, well developed in no acute distress NECK: No JVD; No carotid bruits CARDIAC: RRR, S1 and S2 present, no murmurs, no rubs, no gallops CHEST:  Clear to auscultation without rales, wheezing or rhonchi  Extremities: No pitting pedal edema. Pulses bilaterally symmetric with radial 2+ and dorsalis pedis 2+ NEUROLOGIC:  Alert and oriented x 3  Medication Adjustments/Labs and Tests Ordered: Current medicines are reviewed at length with the patient today.  Concerns regarding medicines are outlined above.  Orders Placed This Encounter  Procedures   Cardiac Stress Test: Informed Consent Details: Physician/Practitioner Attestation; Transcribe to consent form and obtain patient signature   Exercise Tolerance Test   EKG 12-Lead   Meds ordered this encounter  Medications   metoprolol  succinate (TOPROL -XL) 25 MG 24 hr tablet    Sig: Take 1 tablet (25 mg total) by mouth daily.    Dispense:  135 tablet    Refill:  2    Signed, [place] Silveria reddy Claiborne Stroble, MD, MPH, [facility]. 08/08/2023 9:12 AM    [facility]

## 2023-08-08 NOTE — Assessment & Plan Note (Signed)
 Device identified on Apple Watch at home.  Infrequent episodes but symptomatic. Heart monitor March 2022 captured SVT episodes but no A-fib.  Symptoms well-controlled on flecainide  50 mg twice daily and metoprolol  XL 37.5 mg once daily.  Heart rates today in the office in the lower end. Asymptomatic. Lower the dose of metoprolol  XL to 25 mg once daily.  CHA2DS2-VASc score 2 [woman and hypertension]. Given the relatively burden of A-fib and low risk and financial constraints to try Xarelto or Eliquis and difficulty with warfarin use currently not on anticoagulation. Discussed other factors which can increase the risk for stroke, including burden of A-fib and recurrent paroxysmal episodes, diabetes, plaque buildup in the blood vessels, and age advancing to 19 years. I would recommend starting NOACs or warfarin if her financial situation changes or any additional risk factors and of or once she gets to the age of 51.  She acknowledges and will let us  know.  In the interim I recommended her to start taking aspirin 81 mg once daily.  From rhythm control standpoint flecainide  seems to have helped her symptoms. However I do not see any prior testing with treadmill EKG to assess heart rate response and effects of flecainide . Will schedule her for treadmill EKG stress test for chronotropic response, flecainide  monitoring.

## 2023-08-08 NOTE — Patient Instructions (Signed)
 Medication Instructions:  Your physician recommends that you continue on your current medications as directed. Please refer to the Current Medication list given to you today.  *If you need a refill on your cardiac medications before your next appointment, please call your pharmacy*   Lab Work: None ordered If you have labs (blood work) drawn today and your tests are completely normal, you will receive your results only by: MyChart Message (if you have MyChart) OR A paper copy in the mail If you have any lab test that is abnormal or we need to change your treatment, we will call you to review the results.   Testing/Procedures:    Please arrive 15 minutes prior to your appointment time for registration and insurance purposes.  The test will take approximately 45 minutes to complete.  How to prepare for your Exercise Stress Test: Do bring a list of your current medications with you.  If not listed below, you may take your medications as normal. Do wear comfortable clothes (no dresses or overalls) and walking shoes, tennis shoes preferred (no heels or open toed shoes are allowed) Do Not wear cologne, perfume, aftershave or lotions (deodorant is allowed).  If these instructions are not followed, your test will have to be rescheduled.  If you have questions or concerns about your appointment, you can call the Stress Lab at [phone].  If you cannot keep your appointment, please provide 24 hours notification to the Stress Lab, to avoid a possible $50 charge to your account.   Follow-Up: At [facility], you and your health needs are our priority.  As part of our continuing mission to provide you with exceptional heart care, we have created designated Provider Care Teams.  These Care Teams include your primary Cardiologist (physician) and Advanced Practice Providers (APPs -  Physician Assistants and Nurse Practitioners) who all work together to provide you with the care you  need, when you need it.  We recommend signing up for the patient portal called MyChart.  Sign up information is provided on this After Visit Summary.  MyChart is used to connect with patients for Virtual Visits (Telemedicine).  Patients are able to view lab/test results, encounter notes, upcoming appointments, etc.  Non-urgent messages can be sent to your provider as well.   To learn more about what you can do with MyChart, go to [email or web address].    Your next appointment:   12 month(s)  The format for your next appointment:   In Person  Provider:   Alean Kobus, MD    Other Instructions none  Important Information About Sugar

## 2023-08-08 NOTE — Assessment & Plan Note (Signed)
 Blood pressure suboptimally controlled in the office. Advised her to monitor regularly at home. Continue current medications hydrochlorothiazide  12.5 mg once daily and olmesartan 40 mg once daily. Target blood pressure below 130/80 mmHg.

## 2023-08-09 ENCOUNTER — Other Ambulatory Visit: Payer: Self-pay

## 2023-09-06 ENCOUNTER — Ambulatory Visit: Payer: Self-pay

## 2023-09-23 LAB — LAB REPORT - SCANNED: EGFR: 104

## 2023-09-26 ENCOUNTER — Telehealth: Payer: Self-pay

## 2023-09-26 NOTE — Telephone Encounter (Signed)
 Labs received from:[facility]   Drawn on: 09/21/2023  Reviewed by: Dr. Dolphus   Labs drawn: CBC w/diff, CMP, lipid   Results: Cholesterol 212 Non-HDL cholesterol 150 LDL Cholesterol 131  CBC and CMP WNL   Patient is on Skyrizi  150mg  SQ every 12 weeks.

## 2023-09-26 NOTE — Telephone Encounter (Signed)
 Patient requested a sample of Skyrizi .   Labs: 09/21/2023 CBC and CMP WNL   TB Gold: 11/22/2022 negative    Next Visit: 11/24/2023  Last Visit: 06/23/2023  DX: Psoriatic arthritis   Current Dose per office note on 06/23/2023: Skyrizi  150 mg sq injections every 12 weeks    Patient advised Reena she will pick up the sample tomorrow, 09/27/2023. She does not need an ice pack. Sample has been reserved in the sample fridge.

## 2023-09-27 NOTE — Telephone Encounter (Signed)
 Medication Samples have been provided to the patient.  Drug name: Skyrizi        Strength: 150mg /mL        Qty: 1        LOT: [id number] Exp.Date: 07/2024  Dosing instructions: Inject 150mg  into the skin every 12 weeks.

## 2023-09-28 ENCOUNTER — Encounter: Payer: Self-pay | Admitting: *Deleted

## 2023-10-11 ENCOUNTER — Ambulatory Visit: Payer: Self-pay

## 2023-10-11 DIAGNOSIS — I480 Paroxysmal atrial fibrillation: Secondary | ICD-10-CM

## 2023-10-11 DIAGNOSIS — R001 Bradycardia, unspecified: Secondary | ICD-10-CM

## 2023-10-11 DIAGNOSIS — Z79899 Other long term (current) drug therapy: Secondary | ICD-10-CM

## 2023-10-11 DIAGNOSIS — Z5181 Encounter for therapeutic drug level monitoring: Secondary | ICD-10-CM

## 2023-10-11 LAB — EXERCISE TOLERANCE TEST
Angina Index: 0
Base ST Depression (mm): 0 mm
Duke Treadmill Score: 7
Estimated workload: 9.8
Exercise duration (min): 6 min
Exercise duration (sec): 55 s
MPHR: 164 {beats}/min
Peak HR: 131 {beats}/min
Percent HR: 148 %
Rest HR: 52 {beats}/min
ST Depression (mm): 0 mm

## 2023-11-10 NOTE — Progress Notes (Deleted)
 Office Visit Note  Patient: Frances Watkins             Date of Birth: 08-21-1965           MRN: [id number]             PCP: Glenford Harlene SAILOR, NP Referring: Glenford Harlene SAILOR, NP Visit Date: 11/24/2023 Occupation: Data Unavailable  Subjective:  No chief complaint on file.   History of Present Illness: Frances Watkins is a 58 y.o. female ***     Activities of Daily Living:  Patient reports morning stiffness for *** {minute/hour:19697}.   Patient {ACTIONS;DENIES/REPORTS:21021675::Denies} nocturnal pain.  Difficulty dressing/grooming: {ACTIONS;DENIES/REPORTS:21021675::Denies} Difficulty climbing stairs: {ACTIONS;DENIES/REPORTS:21021675::Denies} Difficulty getting out of chair: {ACTIONS;DENIES/REPORTS:21021675::Denies} Difficulty using hands for taps, buttons, cutlery, and/or writing: {ACTIONS;DENIES/REPORTS:21021675::Denies}  No Rheumatology ROS completed.   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   Hypertension 05/29/2020   History of stomach ulcers    PONV (postoperative nausea and vomiting)    Tachycardia    Palpitations 04/09/2020   Primary hypertension 04/09/2020   GERD (gastroesophageal reflux disease)    Heart murmur    Psoriasis 09/21/2018   Anxiety 09/21/2018   Essential hypertension 09/21/2018   History of kidney stones 09/21/2018   Eustachian tube dysfunction 10/05/2011   History of cholesteatoma 10/05/2011    Past Medical History:  Diagnosis Date   Anxiety 09/21/2018   Essential hypertension 09/21/2018   Eustachian tube dysfunction 10/05/2011   GERD (gastroesophageal reflux disease)    Heart murmur    no treatment   History of cholesteatoma 10/05/2011   History of  kidney stones    History of stomach ulcers    PAF (paroxysmal atrial fibrillation) (HCC) 04/09/2020   Palpitations 04/09/2020   PONV (postoperative nausea and vomiting)    no problem  03/14/12   Primary hypertension 04/09/2020   Psoriasis    Tachycardia     Family History  Problem Relation Age of Onset   Arthritis Mother    Diabetes Father    Hypertension Father    High Cholesterol Father    Heart disease Father    Heart attack Father    Gout Father    Cirrhosis Father    Healthy Sister    Psoriasis Daughter        psoriatic arthritis   Healthy Daughter    Lymphoma Maternal Grandmother    Cancer Paternal Grandfather    Past Surgical History:  Procedure Laterality Date   COLONOSCOPY     2014, 2019   HOLMIUM LASER APPLICATION Right 03/23/2012   Procedure: HOLMIUM LASER APPLICATION;  Surgeon: Norleen JINNY Seltzer, MD;  Location: [facility];  Service: Urology;  Laterality: Right;   INNER EAR SURGERY  2000   LITHOTRIPSY     x2   TONSILLECTOMY     age 46 yrs.   TYMPANOPLASTY W/ MASTOIDECTOMY     x2   UPPER GI ENDOSCOPY     2014, 2019   URETEROSCOPY  2014   Social History   Tobacco Use   Smoking status: Never    Passive exposure: Never   Smokeless tobacco: Never  Vaping Use   Vaping status: Never Used  Substance Use Topics   Alcohol use: Yes  Comment: occasional   Drug use: No   Social History   Social History Narrative   Not on file     Immunization History  Administered Date(s) Administered   PFIZER(Purple Top)SARS-COV-2 Vaccination 04/05/2019, 04/24/2019, 10/05/2019   Pneumococcal Conjugate-13 09/27/2018     Objective: Vital Signs: LMP 03/02/2012    Physical Exam   Musculoskeletal Exam: ***  CDAI Exam: CDAI Score: -- Patient Global: --; Provider Global: -- Swollen: --; Tender: -- Joint Exam 11/24/2023   No joint exam has been documented for this visit   There is currently no information documented on the homunculus. Go to the  Rheumatology activity and complete the homunculus joint exam.  Investigation: No additional findings.  Imaging: Exercise Tolerance Test Result Date: 10/11/2023   Patient exercised for 6 min and 55 sec. Maximum HR of 131 bpm. MPHR 79%. Peak METS 9.8. Hypertensive blood pressure response noted during stress.   No ST deviation was noted. The ECG was not diagnostic for ischemia due to failure to achieve 85% MAPHR.   No QRS widening at peak stress; Test done to monitor effect of Flecaine drug therapy.    Recent Labs: Lab Results  Component Value Date   WBC 5.6 05/04/2019   HGB 13.1 05/04/2019   PLT 289 05/04/2019   NA 141 06/19/2019   K 4.1 06/19/2019   CL 106 06/19/2019   CO2 30 06/19/2019   GLUCOSE 93 06/19/2019   BUN 13 06/19/2019   CREATININE 0.72 06/19/2019   BILITOT 0.5 06/19/2019   ALKPHOS 73 01/30/2019   AST 17 06/19/2019   ALT 14 06/19/2019   PROT 6.6 06/19/2019   ALBUMIN 4.5 01/30/2019   CALCIUM 9.1 03/17/2023   GFRAA 112 06/19/2019   QFTBGOLDPLUS Negative 11/22/2022    Speciality Comments: Skyrizi  started 11/30/22  Procedures:  No procedures performed Allergies: Sulfa drugs cross reactors   Assessment / Plan:     Visit Diagnoses: Psoriatic arthritis (HCC)  Psoriasis  High risk medication use  Pain in thoracic spine  Pes cavus  Plantar fasciitis, bilateral  Achilles tendonitis, bilateral  Chronic SI joint pain  Essential hypertension  History of kidney stones  Solar elastosis  Anxiety  PVC's (premature ventricular contractions)  PAF (paroxysmal atrial fibrillation) (HCC)  Orders: No orders of the defined types were placed in this encounter.  No orders of the defined types were placed in this encounter.   Face-to-face time spent with patient was *** minutes. Greater than 50% of time was spent in counseling and coordination of care.  Follow-Up Instructions: No follow-ups on file.   Waddell CHRISTELLA Craze, PA-C  Note - This record has been  created using Dragon software.  Chart creation errors have been sought, but may not always  have been located. Such creation errors do not reflect on  the standard of medical care.

## 2023-11-24 ENCOUNTER — Ambulatory Visit: Payer: Self-pay | Admitting: Physician Assistant

## 2023-11-24 DIAGNOSIS — M7661 Achilles tendinitis, right leg: Secondary | ICD-10-CM

## 2023-11-24 DIAGNOSIS — Z79899 Other long term (current) drug therapy: Secondary | ICD-10-CM

## 2023-11-24 DIAGNOSIS — Z87442 Personal history of urinary calculi: Secondary | ICD-10-CM

## 2023-11-24 DIAGNOSIS — G8929 Other chronic pain: Secondary | ICD-10-CM

## 2023-11-24 DIAGNOSIS — M546 Pain in thoracic spine: Secondary | ICD-10-CM

## 2023-11-24 DIAGNOSIS — L4050 Arthropathic psoriasis, unspecified: Secondary | ICD-10-CM

## 2023-11-24 DIAGNOSIS — I10 Essential (primary) hypertension: Secondary | ICD-10-CM

## 2023-11-24 DIAGNOSIS — I480 Paroxysmal atrial fibrillation: Secondary | ICD-10-CM

## 2023-11-24 DIAGNOSIS — L409 Psoriasis, unspecified: Secondary | ICD-10-CM

## 2023-11-24 DIAGNOSIS — L578 Other skin changes due to chronic exposure to nonionizing radiation: Secondary | ICD-10-CM

## 2023-11-24 DIAGNOSIS — I493 Ventricular premature depolarization: Secondary | ICD-10-CM

## 2023-11-24 DIAGNOSIS — Q667 Congenital pes cavus, unspecified foot: Secondary | ICD-10-CM

## 2023-11-24 DIAGNOSIS — M722 Plantar fascial fibromatosis: Secondary | ICD-10-CM

## 2023-11-24 DIAGNOSIS — F419 Anxiety disorder, unspecified: Secondary | ICD-10-CM

## 2023-12-01 NOTE — Progress Notes (Signed)
 Office Visit Note  Patient: Frances Watkins             Date of Birth: [birth year]           MRN: [id number]             PCP: Glenford Harlene SAILOR, NP Referring: Glenford Harlene SAILOR, NP Visit Date: 12/15/2023 Occupation: Data Unavailable  Subjective:  Medication monitoring   History of Present Illness: Frances Watkins is a 58 y.o. female with history of psoriatic arthritis. Patient remains on Skyrizi  150 mg sq injections every 12 weeks.  Patient reports that she is due for her next dose of Skyrizi  on 12/28/2023.  Patient has requested a sample of Skyrizi  today.  Patient is planning to get updated lab work at her PCPs office next week.  Patient denies any signs or symptoms of a flare.  She has no active psoriasis at this time.  Patient states that she has been off of methotrexate  since May 2025 and has not had any new or worsening symptoms.  Patient continues to have mild Achilles tendinitis affecting the right foot but overall the symptoms have improved while on Skyrizi .  She denies any plantar fasciitis at this time.  She has occasional SI joint discomfort.  She denies any joint swelling at this time.  She denies any new medical conditions.  She denies any recent or recurrent infections.    Activities of Daily Living:  Patient reports morning stiffness for 1 hour.   Patient Denies nocturnal pain.  Difficulty dressing/grooming: Denies Difficulty climbing stairs: Reports Difficulty getting out of chair: Denies Difficulty using hands for taps, buttons, cutlery, and/or writing: Denies  Review of Systems  Constitutional:  Negative for fatigue.  HENT:  Negative for mouth sores and mouth dryness.   Eyes:  Negative for dryness.  Respiratory:  Negative for shortness of breath.   Cardiovascular:  Positive for palpitations. Negative for chest pain.  Gastrointestinal:  Negative for blood in stool, constipation and diarrhea.  Endocrine: Negative for increased urination.  Genitourinary:  Negative for  involuntary urination.  Musculoskeletal:  Positive for joint pain, joint pain, joint swelling and morning stiffness. Negative for gait problem, myalgias, muscle weakness, muscle tenderness and myalgias.  Skin:  Negative for color change, rash, hair loss and sensitivity to sunlight.  Allergic/Immunologic: Negative for susceptible to infections.  Neurological:  Negative for dizziness and headaches.  Hematological:  Negative for swollen glands.  Psychiatric/Behavioral:  Negative for depressed mood and sleep disturbance. The patient is not nervous/anxious.     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   Hypertension 05/29/2020   History of stomach ulcers    PONV (postoperative nausea and vomiting)    Tachycardia    Palpitations 04/09/2020   Primary hypertension 04/09/2020   GERD (gastroesophageal reflux disease)    Heart murmur    Psoriasis 09/21/2018   Anxiety 09/21/2018   Essential hypertension 09/21/2018   History of kidney stones 09/21/2018   Eustachian tube dysfunction 10/05/2011   History of cholesteatoma 10/05/2011    Past Medical History:  Diagnosis Date   Anxiety 09/21/2018   Essential hypertension 09/21/2018   Eustachian tube dysfunction 10/05/2011   GERD (gastroesophageal reflux disease)    Heart murmur  no treatment   History of cholesteatoma 10/05/2011   History of kidney stones    History of stomach ulcers    PAF (paroxysmal atrial fibrillation) (HCC) 04/09/2020   Palpitations 04/09/2020   PONV (postoperative nausea and vomiting)    no problem  03/14/12   Primary hypertension 04/09/2020   Psoriasis    Tachycardia     Family History  Problem Relation Age of Onset   Arthritis Mother     Diabetes Father    Hypertension Father    High Cholesterol Father    Heart disease Father    Heart attack Father    Gout Father    Cirrhosis Father    Healthy Sister    Lymphoma Maternal Grandmother    Cancer Paternal Grandfather    Psoriasis Daughter        psoriatic arthritis   Healthy Daughter    Past Surgical History:  Procedure Laterality Date   COLONOSCOPY     2014, 2019   HOLMIUM LASER APPLICATION Right 03/23/2012   Procedure: HOLMIUM LASER APPLICATION;  Surgeon: Norleen JINNY Seltzer, MD;  Location: [facility];  Service: Urology;  Laterality: Right;   INNER EAR SURGERY  2000   LITHOTRIPSY     x2   TONSILLECTOMY     age 21 yrs.   TYMPANOPLASTY W/ MASTOIDECTOMY     x2   UPPER GI ENDOSCOPY     2014, 2019   URETEROSCOPY  2014   Social History   Tobacco Use   Smoking status: Never    Passive exposure: Never   Smokeless tobacco: Never  Vaping Use   Vaping status: Never Used  Substance Use Topics   Alcohol use: Yes    Comment: occasional   Drug use: No   Social History   Social History Narrative   Not on file     Immunization History  Administered Date(s) Administered   PFIZER(Purple Top)SARS-COV-2 Vaccination 04/05/2019, 04/24/2019, 10/05/2019   Pneumococcal Conjugate-13 09/27/2018     Objective: Vital Signs: BP (!) 162/99   Pulse 63   Temp 97.7 F (36.5 C)   Resp 14   Ht 5' 3 (1.6 m)   Wt 189 lb 3.2 oz (85.8 kg)   LMP 03/02/2012   BMI 33.52 kg/m    Physical Exam Vitals and nursing note reviewed.  Constitutional:      Appearance: She is well-developed.  HENT:     Head: Normocephalic and atraumatic.  Eyes:     Conjunctiva/sclera: Conjunctivae normal.  Cardiovascular:     Rate and Rhythm: Normal rate and regular rhythm.     Heart sounds: Normal heart sounds.  Pulmonary:     Effort: Pulmonary effort is normal.     Breath sounds: Normal breath sounds.  Abdominal:     General: Bowel sounds are normal.     Palpations: Abdomen is  soft.  Musculoskeletal:     Cervical back: Normal range of motion.  Lymphadenopathy:     Cervical: No cervical adenopathy.  Skin:    General: Skin is warm and dry.     Capillary Refill: Capillary refill takes less than 2 seconds.  Neurological:     Mental Status: She is alert and oriented to person, place, and time.  Psychiatric:        Behavior: Behavior normal.      Musculoskeletal Exam: C-spine, thoracic spine, lumbar spine have good range of motion.  No midline spinal tenderness.  Mild SI joint tenderness.  Shoulder joints, elbow  joints, wrist joints, MCPs, PIPs, DIPs have good range of motion with no synovitis.  DIP thickening.  No synovitis or dactylitis noted.  Complete fist formation bilaterally.  Hip joints have good range of motion with no groin pain.  Knee joints have good range of motion no warmth or effusion.  Ankle joints have good range of motion no tenderness or joint swelling.  No evidence of plantar fasciitis.  Pes cavus noted bilaterally.  Mild inflammation along the right Achilles tendon noted.   CDAI Exam: CDAI Score: -- Patient Global: --; Provider Global: -- Swollen: --; Tender: -- Joint Exam 12/15/2023   No joint exam has been documented for this visit   There is currently no information documented on the homunculus. Go to the Rheumatology activity and complete the homunculus joint exam.  Investigation: No additional findings.  Imaging: No results found.   Recent Labs: Lab Results  Component Value Date   WBC 5.6 05/04/2019   HGB 13.1 05/04/2019   PLT 289 05/04/2019   NA 141 06/19/2019   K 4.1 06/19/2019   CL 106 06/19/2019   CO2 30 06/19/2019   GLUCOSE 93 06/19/2019   BUN 13 06/19/2019   CREATININE 0.72 06/19/2019   BILITOT 0.5 06/19/2019   ALKPHOS 73 01/30/2019   AST 17 06/19/2019   ALT 14 06/19/2019   PROT 6.6 06/19/2019   ALBUMIN 4.5 01/30/2019   CALCIUM 9.1 03/17/2023   GFRAA 112 06/19/2019   QFTBGOLDPLUS Negative 11/22/2022     Speciality Comments: Skyrizi  started 11/30/22  Procedures:  No procedures performed Allergies: Sulfa drugs cross reactors    Assessment / Plan:     Visit Diagnoses: Psoriatic arthritis (HCC) - She has no synovitis or dactylitis on examination today.  Her symptoms have been well-controlled on Skyrizi  150 mg subcu injections once every 12 weeks.  She is due for her next dose of Skyrizi  on 12/28/2023.  She continues to tolerate Skyrizi  without any side effects or injection site reactions.  No recent or recurrent infections. Patient does continue methotrexate  in May 2025.  She has not noticed any new or worsening symptoms while taking Skyrizi  as monotherapy.  She does not want to restart methotrexate  at this time.   She has no evidence of plantar fasciitis at this time.  Mild inflammation of the right Achilles was noted but has improved.  Mild SI joint tenderness upon palpation.  Overall her symptoms have been well-controlled while on Skyrizi .  No medication changes will be made at this time.  She was advised to notify us  if she develops any signs or symptoms of a flare.  A sample of Skyrizi  was provided today in the office.  She will follow-up in the office in 6  months or sooner if needed.  Plan: risankizumab -rzaa (SKYRIZI  PEN) 150 MG/ML pen  Psoriasis -No active psoriasis at this time.  She remain on Skyrizi  as prescribed.  Plan: risankizumab -rzaa (SKYRIZI  PEN) 150 MG/ML pen  High risk medication use - Skyrizi  150 mg sq injections every 12 weeks.  A sample was provided today in the office. Discontinued methotrexate  in May 2025.  CBC and CMP updated on 09/21/23. She is planning to get updated lab work this month at her PCPs office.  TB gold negative on 11/22/22.  No recent or recurrent infections. Discussed the importance of holding skyrizi  and methotrexate  if she develops signs or symptoms of an infection and to resume once the infection has completely cleared.    Plan: QuantiFERON-TB Gold  Plus  Screening for tuberculosis - Order for TB gold placed today. Plan: QuantiFERON-TB Gold Plus  Pain in thoracic spine: No midline spinal tenderness.  Pes cavus: Unchanged.  Patient is wearing proper fitting shoes.  Plantar fasciitis, bilateral: Not currently symptomatic.  Achilles tendonitis, bilateral: Improved while on Skyrizi .  Mild inflammation was noted in the right Achilles tendon.  Chronic SI joint pain: Mild tenderness of both SI joints noted today.  Her symptoms have been manageable with use of Skyrizi .  No nocturnal pain.  Other medical conditions are listed as follows:   Essential hypertension: BP was elevated today in the office and was rechecked prior to leaving.    History of kidney stones  Solar elastosis  Anxiety  PVC's (premature ventricular contractions)  PAF (paroxysmal atrial fibrillation) (HCC)   Orders: Orders Placed This Encounter  Procedures   QuantiFERON-TB Gold Plus   Meds ordered this encounter  Medications   risankizumab -rzaa (SKYRIZI  PEN) 150 MG/ML pen    Sig: Inject 150 mg into the skin every 12 weeks thereafter (patient received sample)    Dispense:  1 mL    Refill:  0    Follow-Up Instructions: Return in about 6 months (around 06/13/2024) for Psoriatic arthritis.   Waddell CHRISTELLA Craze, PA-C  Note - This record has been created using Dragon software.  Chart creation errors have been sought, but may not always  have been located. Such creation errors do not reflect on  the standard of medical care.

## 2023-12-15 ENCOUNTER — Ambulatory Visit: Payer: Self-pay | Attending: Physician Assistant | Admitting: Physician Assistant

## 2023-12-15 ENCOUNTER — Encounter: Payer: Self-pay | Admitting: Physician Assistant

## 2023-12-15 ENCOUNTER — Other Ambulatory Visit: Payer: Self-pay | Admitting: *Deleted

## 2023-12-15 VITALS — BP 155/103 | HR 50 | Temp 97.7°F | Resp 14 | Ht 63.0 in | Wt 189.2 lb

## 2023-12-15 DIAGNOSIS — M7662 Achilles tendinitis, left leg: Secondary | ICD-10-CM

## 2023-12-15 DIAGNOSIS — Z111 Encounter for screening for respiratory tuberculosis: Secondary | ICD-10-CM

## 2023-12-15 DIAGNOSIS — L4050 Arthropathic psoriasis, unspecified: Secondary | ICD-10-CM

## 2023-12-15 DIAGNOSIS — L409 Psoriasis, unspecified: Secondary | ICD-10-CM

## 2023-12-15 DIAGNOSIS — M546 Pain in thoracic spine: Secondary | ICD-10-CM

## 2023-12-15 DIAGNOSIS — M533 Sacrococcygeal disorders, not elsewhere classified: Secondary | ICD-10-CM

## 2023-12-15 DIAGNOSIS — I10 Essential (primary) hypertension: Secondary | ICD-10-CM

## 2023-12-15 DIAGNOSIS — Z79899 Other long term (current) drug therapy: Secondary | ICD-10-CM

## 2023-12-15 DIAGNOSIS — G8929 Other chronic pain: Secondary | ICD-10-CM

## 2023-12-15 DIAGNOSIS — Z9225 Personal history of immunosupression therapy: Secondary | ICD-10-CM

## 2023-12-15 DIAGNOSIS — I480 Paroxysmal atrial fibrillation: Secondary | ICD-10-CM

## 2023-12-15 DIAGNOSIS — F419 Anxiety disorder, unspecified: Secondary | ICD-10-CM

## 2023-12-15 DIAGNOSIS — Z87442 Personal history of urinary calculi: Secondary | ICD-10-CM

## 2023-12-15 DIAGNOSIS — M7661 Achilles tendinitis, right leg: Secondary | ICD-10-CM

## 2023-12-15 DIAGNOSIS — Q667 Congenital pes cavus, unspecified foot: Secondary | ICD-10-CM

## 2023-12-15 DIAGNOSIS — I493 Ventricular premature depolarization: Secondary | ICD-10-CM

## 2023-12-15 DIAGNOSIS — L578 Other skin changes due to chronic exposure to nonionizing radiation: Secondary | ICD-10-CM

## 2023-12-15 DIAGNOSIS — M722 Plantar fascial fibromatosis: Secondary | ICD-10-CM

## 2023-12-15 MED ORDER — SKYRIZI PEN 150 MG/ML ~~LOC~~ SOAJ: SUBCUTANEOUS | 0 refills | Status: AC

## 2023-12-15 NOTE — Progress Notes (Signed)
 Medication Samples have been provided to the patient.  Drug name: Skyrizi  150mg /mL Qty: 1 pen LOT: [id number] Exp.Date: 09/2024  Dosing instructions: Inject 150mg  into the skin every 12 weeks  The patient has been instructed regarding the correct time, dose, and frequency of taking this medication, including desired effects and most common side effects.   Sherry Pennant, PharmD, MPH, BCPS, CPP Clinical Pharmacist [facility])

## 2023-12-22 ENCOUNTER — Ambulatory Visit: Payer: Self-pay | Admitting: Physician Assistant

## 2023-12-22 LAB — QUANTIFERON-TB GOLD PLUS
QuantiFERON Mitogen Value: 6.16 [IU]/mL
QuantiFERON Nil Value: 0 [IU]/mL
QuantiFERON TB1 Ag Value: 0.01 [IU]/mL
QuantiFERON TB2 Ag Value: 0.01 [IU]/mL
QuantiFERON-TB Gold Plus: NEGATIVE

## 2023-12-22 NOTE — Progress Notes (Signed)
 TB gold negative

## 2024-06-19 ENCOUNTER — Ambulatory Visit: Payer: Self-pay | Admitting: Rheumatology
